# Patient Record
Sex: Female | Born: 1935 | Race: White | Hispanic: No | Marital: Single | Smoking: Never smoker
Health system: Southern US, Community
[De-identification: ages and names within clinical notes are randomized; demographics above are authoritative.]

## PROBLEM LIST (undated history)

## (undated) DIAGNOSIS — I10 Essential (primary) hypertension: Secondary | ICD-10-CM

## (undated) DIAGNOSIS — R509 Fever, unspecified: Secondary | ICD-10-CM

## (undated) DIAGNOSIS — K5790 Diverticulosis of intestine, part unspecified, without perforation or abscess without bleeding: Secondary | ICD-10-CM

## (undated) DIAGNOSIS — F419 Anxiety disorder, unspecified: Secondary | ICD-10-CM

## (undated) DIAGNOSIS — G5600 Carpal tunnel syndrome, unspecified upper limb: Secondary | ICD-10-CM

## (undated) DIAGNOSIS — K635 Polyp of colon: Secondary | ICD-10-CM

## (undated) DIAGNOSIS — K449 Diaphragmatic hernia without obstruction or gangrene: Secondary | ICD-10-CM

## (undated) DIAGNOSIS — G629 Polyneuropathy, unspecified: Secondary | ICD-10-CM

## (undated) DIAGNOSIS — H3530 Unspecified macular degeneration: Secondary | ICD-10-CM

## (undated) DIAGNOSIS — K219 Gastro-esophageal reflux disease without esophagitis: Secondary | ICD-10-CM

## (undated) DIAGNOSIS — M1990 Unspecified osteoarthritis, unspecified site: Secondary | ICD-10-CM

## (undated) HISTORY — PX: POLYPECTOMY: SHX149

## (undated) HISTORY — DX: Diverticulosis of intestine, part unspecified, without perforation or abscess without bleeding: K57.90

## (undated) HISTORY — DX: Carpal tunnel syndrome, unspecified upper limb: G56.00

## (undated) HISTORY — PX: LASIK: SHX215

## (undated) HISTORY — DX: Gastro-esophageal reflux disease without esophagitis: K21.9

## (undated) HISTORY — DX: Unspecified macular degeneration: H35.30

## (undated) HISTORY — PX: CATARACT EXTRACTION, BILATERAL: SHX1313

## (undated) HISTORY — PX: COLONOSCOPY: SHX174

## (undated) HISTORY — PX: VAGINAL HYSTERECTOMY: SUR661

## (undated) HISTORY — DX: Unspecified osteoarthritis, unspecified site: M19.90

## (undated) HISTORY — DX: Polyp of colon: K63.5

## (undated) HISTORY — DX: Anxiety disorder, unspecified: F41.9

## (undated) HISTORY — DX: Polyneuropathy, unspecified: G62.9

## (undated) HISTORY — PX: CHOLECYSTECTOMY: SHX55

---
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
## 1999-02-02 ENCOUNTER — Encounter (INDEPENDENT_AMBULATORY_CARE_PROVIDER_SITE_OTHER): Payer: Self-pay | Admitting: Specialist

## 1999-02-02 ENCOUNTER — Other Ambulatory Visit: Admission: RE | Admit: 1999-02-02 | Discharge: 1999-02-02 | Payer: Self-pay | Admitting: Gastroenterology

## 2001-04-16 ENCOUNTER — Encounter: Payer: Self-pay | Admitting: Emergency Medicine

## 2001-04-16 ENCOUNTER — Emergency Department (HOSPITAL_COMMUNITY): Admission: EM | Admit: 2001-04-16 | Discharge: 2001-04-16 | Payer: Self-pay | Admitting: Emergency Medicine

## 2001-04-18 ENCOUNTER — Encounter (INDEPENDENT_AMBULATORY_CARE_PROVIDER_SITE_OTHER): Payer: Self-pay | Admitting: Specialist

## 2001-04-18 ENCOUNTER — Inpatient Hospital Stay (HOSPITAL_COMMUNITY): Admission: EM | Admit: 2001-04-18 | Discharge: 2001-04-21 | Payer: Self-pay | Admitting: Internal Medicine

## 2001-06-04 ENCOUNTER — Encounter: Payer: Self-pay | Admitting: Internal Medicine

## 2001-06-04 ENCOUNTER — Ambulatory Visit (HOSPITAL_COMMUNITY): Admission: RE | Admit: 2001-06-04 | Discharge: 2001-06-04 | Payer: Self-pay | Admitting: Internal Medicine

## 2009-04-11 ENCOUNTER — Encounter (INDEPENDENT_AMBULATORY_CARE_PROVIDER_SITE_OTHER): Payer: Self-pay | Admitting: *Deleted

## 2009-04-12 ENCOUNTER — Ambulatory Visit: Payer: Self-pay | Admitting: Gastroenterology

## 2009-04-29 ENCOUNTER — Ambulatory Visit: Payer: Self-pay | Admitting: Gastroenterology

## 2009-05-03 ENCOUNTER — Encounter: Payer: Self-pay | Admitting: Gastroenterology

## 2010-03-02 NOTE — Letter (Signed)
Summary: Surgery [facility] Instructions  [facility]  [address] [place], [place] [address]   Phone: [phone]  Fax: [phone]       TYLYNN BRANIFF    74-Apr-1936    MRN: [id number]        Procedure Day Dorna Bloom:  Farrell Ours  04/29/09     Arrival Time:  1:00PM     Procedure Time:  2:00PM     Location of Procedure:                    _X _  [facility] (4th Floor)                        PREPARATION FOR COLONOSCOPY WITH MOVIPREP   Starting 5 days prior to your procedure 04/24/09 do not eat nuts, seeds, popcorn, corn, beans, peas,  salads, or any raw vegetables.  Do not take any fiber supplements (e.g. Metamucil, Citrucel, and Benefiber).  THE DAY BEFORE YOUR PROCEDURE         DATE: 04/28/09  DAY: THURSDAY  1.  Drink clear liquids the entire day-NO SOLID FOOD  2.  Do not drink anything colored red or purple.  Avoid juices with pulp.  No orange juice.  3.  Drink at least 64 oz. (8 glasses) of fluid/clear liquids during the day to prevent dehydration and help the prep work efficiently.  CLEAR LIQUIDS INCLUDE: Water Jello Ice Popsicles Tea (sugar ok, no milk/cream) Powdered fruit flavored drinks Coffee (sugar ok, no milk/cream) Gatorade Juice: apple, white grape, white cranberry  Lemonade Clear bullion, consomm, broth Carbonated beverages (any kind) Strained chicken noodle soup Hard Candy                             4.  In the morning, mix first dose of MoviPrep solution:    Empty 1 Pouch A and 1 Pouch B into the disposable container    Add lukewarm drinking water to the top line of the container. Mix to dissolve    Refrigerate (mixed solution should be used within 24 hrs)  5.  Begin drinking the prep at 5:00 p.m. The MoviPrep container is divided by 4 marks.   Every 15 minutes drink the solution down to the next mark (approximately 8 oz) until the full liter is complete.   6.  Follow completed prep with 16 oz of clear liquid of your choice (Nothing  red or purple).  Continue to drink clear liquids until bedtime.  7.  Before going to bed, mix second dose of MoviPrep solution:    Empty 1 Pouch A and 1 Pouch B into the disposable container    Add lukewarm drinking water to the top line of the container. Mix to dissolve    Refrigerate  THE DAY OF YOUR PROCEDURE      DATE: 04/29/09  DAY: FRIDAY  Beginning at 9:00AM (5 hours before procedure):         1. Every 15 minutes, drink the solution down to the next mark (approx 8 oz) until the full liter is complete.  2. Follow completed prep with 16 oz. of clear liquid of your choice.    3. You may drink clear liquids until 12:00PM (2 HOURS BEFORE PROCEDURE).   MEDICATION INSTRUCTIONS  Unless otherwise instructed, you should take regular prescription medications with a small sip of water   as early as possible the morning  of your procedure.  Additional medication instructions: Hold Benazepril/HCTZ the morning of procedure.  Take other prescriptions medicine the morning of procedure.         OTHER INSTRUCTIONS  You will need a responsible adult at least 74 years of age to accompany you and drive you home.   This person must remain in the waiting room during your procedure.  Wear loose fitting clothing that is easily removed.  Leave jewelry and other valuables at home.  However, you may wish to bring a book to read or  an iPod/MP3 player to listen to music as you wait for your procedure to start.  Remove all body piercing jewelry and leave at home.  Total time from sign-in until discharge is approximately 2-3 hours.  You should go home directly after your procedure and rest.  You can resume normal activities the  day after your procedure.  The day of your procedure you should not:   Drive   Make legal decisions   Operate machinery   Drink alcohol   Return to work  You will receive specific instructions about eating, activities and medications before you  leave.    The above instructions have been reviewed and explained to me by   Wyona Almas RN  April 12, 2009 1:22 PM     I fully understand and can verbalize these instructions _____________________________ Date _________

## 2010-03-02 NOTE — Letter (Signed)
Summary: Patient Notice- Polyp Results  [facility]  [address] [place], [place] [address]   Phone: [phone]  Fax: [phone]        May 03, 2009 MRN: [id number]    [facility] [address] [place], [place]  [address]    Dear Ms. Bevans,  I am pleased to inform you that the colon polyp(s) removed during your recent colonoscopy was (were) found to be benign (no cancer detected) upon pathologic examination.  I recommend you have a repeat colonoscopy examination in 5 years to look for recurrent polyps, as having colon polyps increases your risk for having recurrent polyps or even colon cancer in the future.  Should you develop new or worsening symptoms of abdominal pain, bowel habit changes or bleeding from the rectum or bowels, please schedule an evaluation with either your primary care physician or with me.  Continue treatment plan as outlined the day of your exam.  Please call us if you are having persistent problems or have questions about your condition that have not been fully answered at this time.  Sincerely,  Meryl Dare MD [facility]  This letter has been electronically signed by your physician.  Appended Document: Patient Notice- Polyp Results letter mailed 4.7.11

## 2010-03-02 NOTE — Miscellaneous (Signed)
Summary: [facility] Previsit/prep  Clinical Lists Changes  Medications: Added new medication of MOVIPREP 100 GM  SOLR (PEG-KCL-NACL-NASULF-NA ASC-C) As per prep instructions. - Signed Rx of MOVIPREP 100 GM  SOLR (PEG-KCL-NACL-NASULF-NA ASC-C) As per prep instructions.;  #1 x 0;  Signed;  Entered by: Wyona Almas RN;  Authorized by: Meryl Dare MD Clementeen Graham;  Method used: Electronically to [facility]*, [address], [place], [place]  [address], Ph: [phone] or [phone], Fax: [phone] Allergies: Added new allergy or adverse reaction of CODEINE Observations: Added new observation of NKA: F (04/12/2009 12:29)    Prescriptions: MOVIPREP 100 GM  SOLR (PEG-KCL-NACL-NASULF-NA ASC-C) As per prep instructions.  #1 x 0   Entered by:   Wyona Almas RN   Authorized by:   Meryl Dare MD [facility]   Signed by:   Wyona Almas RN on 04/12/2009   Method used:   Electronically to        [facility]* (retail)       [address] [place], [place]  [address]       Ph: [phone] or [phone]       Fax: [phone]   RxID:   [id number]

## 2010-03-02 NOTE — Procedures (Signed)
Summary: Colonoscopy  Patient: [name] Note: All result statuses are Final unless otherwise noted.  Tests: (1) Colonoscopy (COL)   COL Colonoscopy           DONE     [facility]     [address]     [place], [place]  [address]           COLONOSCOPY PROCEDURE REPORT           PATIENT:  Caitlin Chavez, Caitlin Chavez  MR#:  [id number]     BIRTHDATE:  21-Mar-1934, 74 yrs. old  GENDER:  female           ENDOSCOPIST:  Judie Petit T. Russella Dar, MD, [facility]     Referred by:  Kathee Delton, PA           PROCEDURE DATE:  04/29/2009     PROCEDURE:  Colonoscopy with biopsy and snare polypectomy     ASA CLASS:  Class II     INDICATIONS:  1) Routine Risk Screening           MEDICATIONS:   Fentanyl 50 mcg IV, Versed 7 mg IV           DESCRIPTION OF PROCEDURE:   After the risks benefits and     alternatives of the procedure were thoroughly explained, informed     consent was obtained.  Digital rectal exam was performed and     revealed no abnormalities.   The LB PCF-H180AL [id number] endoscope     was introduced through the anus and advanced to the cecum, which     was identified by both the appendix and ileocecal valve, without     limitations.  The quality of the prep was excellent, using     MoviPrep.  The instrument was then slowly withdrawn as the colon     was fully examined.     <<PROCEDUREIMAGES>>           FINDINGS:  Three polyps were found in the ascending colon. They     were 3 mm in size. The polyps were removed using cold biopsy     forceps.  A sessile polyp was found at the hepatic flexure. It was     6 mm in size. Polyp was snared without cautery. Retrieval was     successful. A sessile polyp was found in the mid transverse colon.     Polyp was snared without cautery. Retrieval was successful.  Mild     diverticulosis was found in the sigmoid colon.  This was otherwise     a normal examination of the colon.  Retroflexed views in the     rectum revealed no abnormalities.  The time to cecum  =  3     minutes. The scope was then withdrawn (time =  10.25  min) from the     patient and the procedure completed.           COMPLICATIONS:  None           ENDOSCOPIC IMPRESSION:     1) 3 mm Three polyps in the ascending colon     2) 6 mm sessile polyp at the hepatic flexure     3) Sessile polyp in the mid transverse colon     4) Mild diverticulosis in the sigmoid colon           RECOMMENDATIONS:     1) Await pathology results     2) High fiber diet with  liberal fluid intake.     3) If the polyps removed today are adenomatous (pre-cancerous),     you will need a repeat colonoscopy in 5 years. Otherwise you     should continue to follow colorectal cancer screening guidelines     for "routine risk" patients with colonoscopy in 10 years.     Venita Lick. Russella Dar, MD, Clementeen Graham           n.     eSIGNED:   Venita Lick. Stark at 04/29/2009 02:25 PM           Shye, Doty Beaver, [phone]  Note: An exclamation mark (!) indicates a result that was not dispersed into the flowsheet. Document Creation Date: 04/29/2009 2:26 PM _______________________________________________________________________  (1) Order result status: Final Collection or observation date-time: 04/29/2009 14:21 Requested date-time:  Receipt date-time:  Reported date-time:  Referring Physician:   Ordering Physician: Claudette Head [phone]) Specimen Source:  Source: Launa Grill Order Number: [id number] Lab site:   Appended Document: Colonoscopy     Procedures Next Due Date:    Colonoscopy: 04/2014

## 2010-06-16 NOTE — Op Note (Signed)
[facility]  Patient:    PADEN, KURAS Visit Number: [id number] MRN: [id number]          Service Type: MED Location: [phone] Attending Physician:  Dolores Patty Dictated by:   Zigmund Daniel, M.D. Proc. Date: 04/19/01 Admit Date:  04/18/2001   CC:         Titus Dubin. Alwyn Ren, M.D. [facility]   Operative Report  PREOPERATIVE DIAGNOSES:  Cholelithiasis and acute cholecystitis.  POSTOPERATIVE DIAGNOSES:  Cholelithiasis and acute cholecystitis.  OPERATION PERFORMED:  Laparoscopic cholecystectomy.  SURGEON:  Zigmund Daniel, M.D.  ASSISTANT:  Angelia Mould. Derrell Lolling, M.D.  ANESTHESIA:  General.  DESCRIPTION OF PROCEDURE:  After the patient was monitored and anesthetized and had routine preparation and draping of the abdomen, I anesthetized the area below the umbilicus, made a 3 cm transverse incision and incised the fascia longitudinally in the midline, opened the peritoneum bluntly, placed an #0 Vicryl pursestring suture and secured a Hasson cannula. After inflating the abdomen with CO2, I found that the gallbladder was acutely inflamed and distended. I then anesthetized three additional port sites and put in a 10 mm epigastric port and two 5 mm lateral ports and placed the patient head up, foot down and tilted to the left. I then bluntly separated adhesions of the omentum to the undersurface of the gallbladder and drained the clear bile from the area of the fundus of the gallbladder. It was then possible to grasp the fundus of the gallbladder and pull it up toward the right shoulder. I then dissected further to free up the gallbladder from the adhesions and identified the infundibulum, grasped it and pulled it laterally. In so doing, I accidentally tore a hole in it and what appeared to be pus came out. I sucked that away immediately. Subsequently after we removed the gallbladder, I cultured the lumen of it. With careful dissection, I  identified the cystic duct and the cystic artery. I clipped the cystic artery with three clips and cut between the two closest to the gallbladder. I put three clips on the cystic duct and cut above it. I then used the spatula cautery to detach the gallbladder from the liver and gained hemostasis in the gallbladder fossa. I then removed the gallbladder in a plastic pouch through the umbilical incision and replaced that port and copiously irrigated the operative area and removed the irrigant. Because of the inflammation which was present and the fact that some irrigant remained in the lateral gutter, I placed a 19 Blake drain brought out through the lateral port site and placed in the gallbladder fossa. I then checked again for hemostasis and found it to be good. I then removed the epigastric and lateral ports under direct vision and then camera and tied the pursestring suture in the umbilical incision. I closed the skin incisions with intracuticular 4-0 Vicryl and Steri-Strips. The patient was stable through the procedure. Dictated by:   Zigmund Daniel, M.D. Attending Physician:  Dolores Patty DD:  04/19/01 TD:  04/21/01 Job: [id number]

## 2010-06-16 NOTE — H&P (Signed)
[facility]  Patient:    Caitlin Chavez, Caitlin Chavez Visit Number: [id number] MRN: [id number]          Service Type: MED Location: [phone] Attending Physician:  Dolores Patty Dictated by:   Titus Dubin. Alwyn Ren, M.D. [facility] Admit Date:  04/18/2001   CC:         Zigmund Daniel, M.D.   History and Physical  HISTORY OF PRESENT ILLNESS:  Mykenzi Vanzile is a 76 year old white female admitted with right abdominal pain with clinical suspicion of a surgical abdomen.  On April 16, 2001, at approximately 4 a.m., she was seen in the emergency room at [facility] with epigastric knife-like pain.  Work-up was extensive and included an EKG, abdominal series, CPK and MB, lipase, urinalysis, and CBC, all of which were normal.  Her CMET was also normal, except for a glucose of 126.  She was given Prevacid and a suppository with some minor benefit.  The pain moved to the right lateral abdomen and has been persistent.  In fact, she has been unable to eat for the last 24 hours.  Now it is described as sharp and worse with deep breathing or with walking.  She denies any chest pain or shortness of breath.  She has had no melena or rectal bleeding.  PAST MEDICAL HISTORY:  Hysterectomy for pelvic pain.  She still has her ovaries.  She was evaluated in 1992 for chest pain.  Medical problems include hypertension, hypercholesterolemia, benign essential tremor, and esophageal reflux.  On occasion, she has had hypokalemia.  FAMILY HISTORY:  Congestive heart failure in her mother.  Gastritis in a sister.  Stomach cancer in her maternal grandmother.  SOCIAL HISTORY:  She has never smoked and does not drink.  She is not physically active.  ALLERGIES:  She is intolerant or allergic to CODEINE and SULFA.  MEDICATIONS: 1. Premarin 0.625 mg daily. 2. Centrum Silver daily. 3. Aspirin daily. 4. Inderal LA 80 mg one each morning for essential tremor. 5. Prevacid 30 mg  twice a day. 6. Lotensin 20/25 mg daily. 7. Calcium 1000 mg daily.  REVIEW OF SYSTEMS:  Some night sweats with possible fever.  The remainder of the review of systems was reviewed and is negative.  PHYSICAL EXAMINATION:  She appears uncomfortable.  WEIGHT:  170-1/2 pounds.  VITAL SIGNS:  Temperature 97.4 degrees, pulse 58, respiratory rate 22, and blood pressure 102/56.  HEENT:  She has arterial narrowing on fundal exam.  The otolaryngologic exam was unremarkable.  LUNGS:  She splints with respirations, but there is no rub noted.  HEART:  She has an S4.  EXTREMITIES:  All pulses are intact.  There are no bruits noted.  ABDOMEN:  Bowel sounds are decreased.  She is exquisitely tender over the right mid abdomen.  There is a mass effect in this area.  RECTAL:  The vault is empty.  Hemoccult testing is negative.  LYMPHATICS:  She has no lymphadenopathy in the head, neck, or axilla.  SKIN:  Somewhat pale, but dry and hot.  NEUROLOGIC:  She has no localizing neurologic signs.  The neuropsychiatric exam is negative.  IMPRESSION AND PLAN:  She is admitted for CT of the abdomen and pelvis.  A surgical consultation has been called.  The differential would include acute gallbladder disease.  The aggravation by deep breathing could be compatible with this.  Mass effect with rule out pneumonia is a likely etiology of her fever and abdominal pain.  She has not had an appendectomy and atypical presentation is also possible as is a colonic process despite the negative Hemoccults. Dictated by:   Titus Dubin. Alwyn Ren, M.D. [facility] Attending Physician:  Dolores Patty DD:  04/18/01 TD:  04/19/01 Job: [id number]

## 2010-06-16 NOTE — Discharge Summary (Signed)
[facility]  Patient:    Caitlin Chavez, Caitlin Chavez Visit Number: [id number] MRN: [id number]          Service Type: MED Location: [phone] Attending Physician:  Meredith Leeds Dictated by:   Zigmund Daniel, M.D. Admit Date:  04/18/2001 Discharge Date: 04/21/2001   CC:         Titus Dubin. Alwyn Ren, M.D. [facility]   Discharge Summary  HISTORY OF PRESENT ILLNESS:  The patient is a 76 year old white female admitted with the findings of upper abdominal pain and tenderness. Gallbladder ultrasound showed gallstones and a thick gallbladder wall. She was admitted by Dr. Alwyn Ren. Liver tests and a CBC were not remarkable. She had a fever. See the history and physical for greater detail. The patients general health is good. She is on Premarin, Centrum, and aspirin and Inderal LA for a tremor. She takes Prevacid for hyperacidity.  PHYSICAL EXAMINATION:  There was marked tenderness of the upper abdomen. Her physical examination was otherwise unremarkable.  HOSPITAL COURSE:  Dr. Alwyn Ren admitted the patient, provided IV antibiotics and pain medicine. I saw the patient that day and felt that she had acute cholecystitis. Ultrasound supported this diagnosis. On April 19, 2001, she underwent a laparoscopic cholecystectomy. The operation went well and postoperatively the patient did quite well. The next day she had a good deal of pain and nausea. She had a fever. I checked labs which were not remarkable and continued IV Cipro. The next day she felt very much better and went home. There had been a drain used postoperatively and I removed it on the day of discharge. It did not drain any bile. I gave her a prescription for Cipro 500 mg twice a day for five days. She is to see me at the office in 2-3 weeks.  DISCHARGE DIAGNOSES:  1. Cholelithiasis and acute cholecystitis.  2. Benign essential tremor.  OPERATION PERFORMED:  Laparoscopic cholecystectomy.  CONDITION ON  DISCHARGE:  Improved.Dictated by:   Zigmund Daniel, M.D.  Attending Physician:  Meredith Leeds DD:  04/25/01 TD:  04/26/01 Job: [id number]

## 2014-04-15 ENCOUNTER — Encounter: Payer: Self-pay | Admitting: Gastroenterology

## 2014-07-30 ENCOUNTER — Encounter: Payer: Self-pay | Admitting: Gastroenterology

## 2014-10-04 ENCOUNTER — Emergency Department (HOSPITAL_COMMUNITY)
Admission: EM | Admit: 2014-10-04 | Discharge: 2014-10-04 | Disposition: A | Discharge status: Home / Self Care | Payer: No Typology Code available for payment source | Attending: Emergency Medicine | Admitting: Emergency Medicine

## 2014-10-04 ENCOUNTER — Encounter (HOSPITAL_COMMUNITY): Payer: Self-pay | Admitting: Emergency Medicine

## 2014-10-04 DIAGNOSIS — Y9389 Activity, other specified: Secondary | ICD-10-CM | POA: Insufficient documentation

## 2014-10-04 DIAGNOSIS — S0990XA Unspecified injury of head, initial encounter: Secondary | ICD-10-CM | POA: Diagnosis present

## 2014-10-04 DIAGNOSIS — Z79899 Other long term (current) drug therapy: Secondary | ICD-10-CM | POA: Diagnosis not present

## 2014-10-04 DIAGNOSIS — R519 Headache, unspecified: Secondary | ICD-10-CM

## 2014-10-04 DIAGNOSIS — Z8719 Personal history of other diseases of the digestive system: Secondary | ICD-10-CM | POA: Insufficient documentation

## 2014-10-04 DIAGNOSIS — Y92410 Unspecified street and highway as the place of occurrence of the external cause: Secondary | ICD-10-CM | POA: Diagnosis not present

## 2014-10-04 DIAGNOSIS — I10 Essential (primary) hypertension: Secondary | ICD-10-CM | POA: Diagnosis not present

## 2014-10-04 DIAGNOSIS — Y998 Other external cause status: Secondary | ICD-10-CM | POA: Diagnosis not present

## 2014-10-04 DIAGNOSIS — R51 Headache: Secondary | ICD-10-CM

## 2014-10-04 HISTORY — DX: Essential (primary) hypertension: I10

## 2014-10-04 HISTORY — DX: Diaphragmatic hernia without obstruction or gangrene: K44.9

## 2014-10-04 MED ORDER — CYCLOBENZAPRINE HCL 10 MG PO TABS: 5.0000 mg | ORAL_TABLET | Freq: Three times a day (TID) | ORAL | Status: DC | PRN
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
## 2014-10-04 NOTE — ED Notes (Signed)
Patient was alert, oriented and stable upon discharge. RN went over AVS and patient had no further questions.  
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
## 2014-10-21 ENCOUNTER — Encounter: Payer: Self-pay | Admitting: Gastroenterology

## 2015-07-12 ENCOUNTER — Encounter: Payer: Self-pay | Admitting: Cardiology

## 2015-07-18 ENCOUNTER — Telehealth: Payer: Self-pay | Admitting: Cardiology

## 2015-07-18 NOTE — Telephone Encounter (Signed)
Received records from [facility] for appointment on 07/21/15 with Dr Percival Spanish.  Records given to [facility] (medical records) for Dr Hochrein's schedule on 07/21/15. lp

## 2015-07-20 NOTE — Progress Notes (Signed)
Cardiology Office Note   Date:  07/21/2015   ID:  Bay, Kaszynski 1934-09-18, MRN [id number]  PCP:  Tula Nakayama  Cardiologist:   Minus Breeding, MD   Chief Complaint  Patient presents with  . Shortness of Breath  . Chest Pain      History of Present Illness: Caitlin Chavez is a 81 y.o. female who presents for evaluation of shortness of breath and chest discomfort. She has no past cardiac history. She has been under stress because her husband died 39 months ago. They were married for over 49 years.  She says that for about the last 10 months she's been getting some mid chest discomfort. It seems to happen at rest or maybe with emotional stress. She doesn't necessarily notice it when she does her activities such as vacuuming. She does however get short of breath vacuuming and this has been last 3-4 months. She'll be short of breath walking a short distance in her yard. She's not describing resting shortness of breath, PND or orthopnea. She doesn't describe any palpitations, presyncope or syncope. She does mention that the discomfort is mild 2 out of 10. There is no radiation. It comes and goes spontaneously. Be a heaviness. She's not had before.   Past Medical History  Diagnosis Date  . Hypertension   . Hiatal hernia     Past Surgical History  Procedure Laterality Date  . Cholecystectomy    . Vaginal hysterectomy       Current Outpatient Prescriptions  Medication Sig Dispense Refill  . benazepril-hydrochlorthiazide (LOTENSIN HCT) 20-25 MG per tablet Take 1 tablet by mouth daily.  2  . carvedilol (COREG) 6.25 MG tablet Take 6.25 mg by mouth 2 (two) times daily.    Marland Kitchen gabapentin (NEURONTIN) 300 MG capsule Take 300 mg by mouth 2 (two) times daily.  3  . omeprazole (PRILOSEC) 20 MG capsule Take 20 mg by mouth daily.  3  . propranolol ER (INDERAL LA) 80 MG 24 hr capsule Take 80 mg by mouth daily.  5   No current facility-administered medications for this visit.     Allergies:   Codeine    Social History:  The patient  reports that she has never smoked. She does not have any smokeless tobacco history on file. She reports that she does not drink alcohol.   Family History:  The patient's family history is noted for her father having an MI at age 100.   ROS:  Please see the history of present illness.   Otherwise, review of systems are positive for neuropathy.   All other systems are reviewed and negative.    PHYSICAL EXAM: VS:  BP 126/74 mmHg  Pulse 80  Ht 5\' 5"  (1.651 m)  Wt 173 lb (78.472 kg)  BMI 28.79 kg/m2 , BMI Body mass index is 28.79 kg/(m^2). GENERAL:  Well appearing HEENT:  Pupils equal round and reactive, fundi not visualized, oral mucosa unremarkable NECK:  No jugular venous distention, waveform within normal limits, carotid upstroke brisk and symmetric, no bruits, no thyromegaly LYMPHATICS:  No cervical, inguinal adenopathy LUNGS:  Clear to auscultation bilaterally BACK:  No CVA tenderness CHEST:  Unremarkable HEART:  PMI not displaced or sustained,S1 and S2 within normal limits, no S3, no S4, no clicks, no rubs, no murmurs ABD:  Flat, positive bowel sounds normal in frequency in pitch, no bruits, no rebound, no guarding, no midline pulsatile mass, no hepatomegaly, no splenomegaly EXT:  2 plus pulses throughout,  trace leg edema, no cyanosis no clubbing SKIN:  No rashes no nodules NEURO:  Cranial nerves II through XII grossly intact, motor grossly intact throughout PSYCH:  Cognitively intact, oriented to person place and time    EKG:  EKG is ordered today. The ekg ordered today demonstrates sinus rhythm, rate 80, axis within normal limits, intervals within normal limits, poor anterior R-wave progression, low voltage in limb leads, nonspecific T-wave flattening.   Recent Labs: No results found for requested labs within last 365 days.    Lipid Panel No results found for: CHOL, TRIG, HDL, CHOLHDL, VLDL, LDLCALC, LDLDIRECT     Wt Readings from Last 3 Encounters:  07/21/15 173 lb (78.472 kg)      Other studies Reviewed: Additional studies/ records that were reviewed today include: Office records.. Review of the above records demonstrates:  Please see elsewhere in the note.     ASSESSMENT AND PLAN:  Chest pain:  The patient does describe some chest discomfort and shortness of breath. This could certainly be an anginal equivalent. Given his stress testing is indicated. However, she would not be a walk on a treadmill. Therefore, she will have a [employer].  Dyspnea:  This will be evaluated as above. Vision I will check a BNP level.  Hypertension:  The blood pressure is at target. No change in medications is indicated. We will continue with therapeutic lifestyle changes (TLC).   Current medicines are reviewed at length with the patient today.  The patient does not have concerns regarding medicines.  The following changes have been made:  no change  Labs/ tests ordered today include:   Orders Placed This Encounter  Procedures  . B Nat Peptide  . Myocardial Perfusion Imaging  . EKG 12-Lead     Disposition:   FU with me in six months or sooner if she has worse symptoms.      Signed, Minus Breeding, MD  07/21/2015 12:31 PM    [facility]

## 2015-07-21 ENCOUNTER — Ambulatory Visit (INDEPENDENT_AMBULATORY_CARE_PROVIDER_SITE_OTHER): Payer: Medicare Other | Admitting: Cardiology

## 2015-07-21 ENCOUNTER — Encounter: Payer: Self-pay | Admitting: Cardiology

## 2015-07-21 VITALS — BP 126/74 | HR 80 | Ht 65.0 in | Wt 173.0 lb

## 2015-07-21 DIAGNOSIS — R0789 Other chest pain: Secondary | ICD-10-CM | POA: Diagnosis not present

## 2015-07-21 DIAGNOSIS — R0602 Shortness of breath: Secondary | ICD-10-CM | POA: Diagnosis not present

## 2015-07-21 NOTE — Patient Instructions (Addendum)
Medication Instructions:  Continue current medications  Labwork: BNP today  Testing/Procedures: Your physician has requested that you have a lexiscan myoview. For further information please visit [email or web address]. Please follow instruction sheet, as given.  Follow-Up: Your physician wants you to follow-up in: 6 Months. You will receive a reminder letter in the mail two months in advance. If you don't receive a letter, please call our office to schedule the follow-up appointment.   Any Other Special Instructions Will Be Listed Below (If Applicable).  If you need a refill on your cardiac medications before your next appointment, please call your pharmacy.

## 2015-07-22 LAB — BRAIN NATRIURETIC PEPTIDE: Brain Natriuretic Peptide: 35.6 pg/mL (ref <100)

## 2015-08-04 ENCOUNTER — Telehealth: Payer: Self-pay | Admitting: *Deleted

## 2015-08-04 ENCOUNTER — Telehealth (HOSPITAL_COMMUNITY): Payer: Self-pay

## 2015-08-04 DIAGNOSIS — J9859 Other diseases of mediastinum, not elsewhere classified: Secondary | ICD-10-CM

## 2015-08-04 NOTE — Telephone Encounter (Signed)
-----   Message from Minus Breeding, MD sent at 07/26/2015 11:12 AM EDT ----- Given her dyspnea and lack of recent chest films to compare I would suggest a non contrasted chest CT to evaluate possible mediastinal mass.  Possibly this is bowel but needs to be followed up.  Call Ms. Mink with the results and send results to [facility], PA-C

## 2015-08-04 NOTE — Telephone Encounter (Signed)
Encounter complete. 

## 2015-08-04 NOTE — Telephone Encounter (Signed)
Ct chest w/o contrast was ordered message send to scheduler to be schedule.

## 2015-08-05 ENCOUNTER — Telehealth (HOSPITAL_COMMUNITY): Payer: Self-pay

## 2015-08-05 NOTE — Telephone Encounter (Signed)
Encounter complete. 

## 2015-08-08 ENCOUNTER — Telehealth: Payer: Self-pay | Admitting: Cardiology

## 2015-08-08 NOTE — Telephone Encounter (Signed)
Incoming call taken by Thomes Dinning, I was on the phone and unable to take the call.  Per Butch Penny, I do not need to call Crystal back.

## 2015-08-08 NOTE — Telephone Encounter (Signed)
Called back to [facility]. Crystal unavailable at the time. Left information that Dr Percival Spanish wanted a non-contrasted CT of the chest. Left message for her to call back if she has any questions.

## 2015-08-08 NOTE — Telephone Encounter (Signed)
New message      Returning a call to the nurse regarding the CT scan order.  Please call

## 2015-08-09 ENCOUNTER — Encounter (HOSPITAL_COMMUNITY): Payer: Medicare Other

## 2015-08-10 ENCOUNTER — Ambulatory Visit (HOSPITAL_COMMUNITY)
Admission: RE | Admit: 2015-08-10 | Discharge: 2015-08-10 | Disposition: A | Discharge status: Home / Self Care | Payer: Medicare Other | Source: Ambulatory Visit | Attending: Cardiology | Admitting: Cardiology

## 2015-08-10 DIAGNOSIS — E669 Obesity, unspecified: Secondary | ICD-10-CM | POA: Insufficient documentation

## 2015-08-10 DIAGNOSIS — Z6828 Body mass index (BMI) 28.0-28.9, adult: Secondary | ICD-10-CM | POA: Insufficient documentation

## 2015-08-10 DIAGNOSIS — Z8249 Family history of ischemic heart disease and other diseases of the circulatory system: Secondary | ICD-10-CM | POA: Insufficient documentation

## 2015-08-10 DIAGNOSIS — R5383 Other fatigue: Secondary | ICD-10-CM | POA: Diagnosis not present

## 2015-08-10 DIAGNOSIS — R0789 Other chest pain: Secondary | ICD-10-CM | POA: Insufficient documentation

## 2015-08-10 DIAGNOSIS — R079 Chest pain, unspecified: Secondary | ICD-10-CM | POA: Insufficient documentation

## 2015-08-10 DIAGNOSIS — R0609 Other forms of dyspnea: Secondary | ICD-10-CM | POA: Insufficient documentation

## 2015-08-10 DIAGNOSIS — R0602 Shortness of breath: Secondary | ICD-10-CM | POA: Insufficient documentation

## 2015-08-10 DIAGNOSIS — I10 Essential (primary) hypertension: Secondary | ICD-10-CM | POA: Diagnosis not present

## 2015-08-10 LAB — MYOCARDIAL PERFUSION IMAGING
CHL CUP NUCLEAR SDS: 0
CHL CUP RESTING HR STRESS: 67 {beats}/min
LV sys vol: 15 mL
LVDIAVOL: 54 mL (ref 46–106)
Peak HR: 83 {beats}/min
SRS: 0
SSS: 0
TID: 1.27

## 2015-08-10 MED ORDER — REGADENOSON 0.4 MG/5ML IV SOLN
0.4000 mg | Freq: Once | INTRAVENOUS | Status: AC
  Administered 2015-08-10: 0.4 mg via INTRAVENOUS

## 2015-08-10 MED ORDER — TECHNETIUM TC 99M TETROFOSMIN IV KIT
31.7000 | PACK | Freq: Once | INTRAVENOUS | Status: AC | PRN
  Administered 2015-08-10: 31.7 via INTRAVENOUS
  Filled 2015-08-10: qty 32

## 2015-08-10 MED ORDER — AMINOPHYLLINE 25 MG/ML IV SOLN
75.0000 mg | Freq: Once | INTRAVENOUS | Status: AC
  Administered 2015-08-10: 75 mg via INTRAVENOUS

## 2015-08-10 MED ORDER — TECHNETIUM TC 99M TETROFOSMIN IV KIT
10.4000 | PACK | Freq: Once | INTRAVENOUS | Status: AC | PRN
  Administered 2015-08-10: 10.4 via INTRAVENOUS
  Filled 2015-08-10: qty 10

## 2015-08-12 ENCOUNTER — Ambulatory Visit (INDEPENDENT_AMBULATORY_CARE_PROVIDER_SITE_OTHER)
Admission: RE | Admit: 2015-08-12 | Discharge: 2015-08-12 | Disposition: A | Discharge status: Home / Self Care | Payer: Medicare Other | Source: Ambulatory Visit | Attending: Cardiology | Admitting: Cardiology

## 2015-08-12 DIAGNOSIS — J9859 Other diseases of mediastinum, not elsewhere classified: Secondary | ICD-10-CM

## 2015-11-10 ENCOUNTER — Encounter: Payer: Self-pay | Admitting: Nurse Practitioner

## 2015-11-16 ENCOUNTER — Other Ambulatory Visit: Payer: Self-pay | Admitting: Family Medicine

## 2015-11-16 DIAGNOSIS — R1030 Lower abdominal pain, unspecified: Secondary | ICD-10-CM

## 2015-11-21 ENCOUNTER — Ambulatory Visit
Admission: RE | Admit: 2015-11-21 | Discharge: 2015-11-21 | Disposition: A | Discharge status: Home / Self Care | Payer: Medicare Other | Source: Ambulatory Visit | Attending: Family Medicine | Admitting: Family Medicine

## 2015-11-21 DIAGNOSIS — R1030 Lower abdominal pain, unspecified: Secondary | ICD-10-CM

## 2015-11-21 MED ORDER — IOPAMIDOL (ISOVUE-300) INJECTION 61%
100.0000 mL | Freq: Once | INTRAVENOUS | Status: AC | PRN
  Administered 2015-11-21: 100 mL via INTRAVENOUS

## 2015-11-23 ENCOUNTER — Encounter: Payer: Self-pay | Admitting: Nurse Practitioner

## 2015-11-23 ENCOUNTER — Ambulatory Visit (INDEPENDENT_AMBULATORY_CARE_PROVIDER_SITE_OTHER): Payer: Medicare Other | Admitting: Nurse Practitioner

## 2015-11-23 VITALS — BP 112/66 | HR 76 | Ht 62.4 in | Wt 176.1 lb

## 2015-11-23 DIAGNOSIS — Z86010 Personal history of colonic polyps: Secondary | ICD-10-CM | POA: Diagnosis not present

## 2015-11-23 DIAGNOSIS — R1032 Left lower quadrant pain: Secondary | ICD-10-CM

## 2015-11-23 MED ORDER — NA SULFATE-K SULFATE-MG SULF 17.5-3.13-1.6 GM/177ML PO SOLN: 1.0000 | Freq: Once | ORAL | 0 refills | Status: AC

## 2015-11-23 NOTE — Patient Instructions (Addendum)
If you are age 79 or older, your body mass index should be between 23-30. Your Body mass index is 31.8 kg/m. If this is out of the aforementioned range listed, please consider follow up with your Primary Care Provider.  If you are age 36 or younger, your body mass index should be between 19-25. Your Body mass index is 31.8 kg/m. If this is out of the aformentioned range listed, please consider follow up with your Primary Care Provider.   You have been scheduled for a colonoscopy. Please follow written instructions given to you at your visit today.  Please pick up your prep supplies at the pharmacy within the next 1-3 days. If you use inhalers (even only as needed), please bring them with you on the day of your procedure. Your physician has requested that you go to www.startemmi.com and enter the access code given to you at your visit today. This web site gives a general overview about your procedure. However, you should still follow specific instructions given to you by our office regarding your preparation for the procedure.  Thank you for choosing [facility]  Dr Wilfrid Lund III

## 2015-11-23 NOTE — Progress Notes (Signed)
HPI: Patient is a 81 year old female known to Dr. Fuller Plan for history of adenomatous colon polyps in 2011. She is due for surveillance colonoscopy. No bowel changes or rectal bleeding. Recent hgb 12.   Patient is referred by PCP, Isabella Stalling, P.A-C for evaluation of lower abdominal pain. Pain is predominantly left-sided and located in her left groin. It is a constant achy type pain,  hurts to lay on left side. Pain unrelated to bowel movements.  She has some low back pain at times. She saw PCP for this pain 11/10/15, found to have a UTI, given Cipro . Serum chemistries unremarkable. CBC showed normal WBC. Hemoglobin 12.  CT scan 2 days ago for the pain was unremarkable. No vaginal symptoms, patient is status post hysterectomy.  Past Medical History:  Diagnosis Date  . Anxiety   . Colon polyps   . Diverticulosis   . Hiatal hernia   . Hypertension     Past Surgical History:  Procedure Laterality Date  . CHOLECYSTECTOMY    . VAGINAL HYSTERECTOMY     Family History  Problem Relation Age of Onset  . Heart attack Father 66  . Alcohol abuse Father   . Heart failure Mother 28    chf  . Stomach cancer Maternal Grandmother   . Colon cancer Neg Hx    Social History  Substance Use Topics  . Smoking status: Never Smoker  . Smokeless tobacco: Never Used  . Alcohol use No   Current Outpatient Prescriptions  Medication Sig Dispense Refill  . benazepril-hydrochlorthiazide (LOTENSIN HCT) 20-25 MG per tablet Take 1 tablet by mouth daily.  2  . carvedilol (COREG) 6.25 MG tablet Take 6.25 mg by mouth 2 (two) times daily.    Marland Kitchen gabapentin (NEURONTIN) 300 MG capsule Take 300 mg by mouth 2 (two) times daily.  3  . omeprazole (PRILOSEC) 20 MG capsule Take 20 mg by mouth daily.  3  . Na Sulfate-K Sulfate-Mg Sulf 17.5-3.13-1.6 GM/180ML SOLN Take 1 kit by mouth once. 354 mL 0   No current facility-administered medications for this visit.    Allergies  Allergen Reactions  . Codeine       REACTION: hives/nausea    Review of Systems: All systems reviewed and negative except where noted in HPI.   Ct Abdomen Pelvis W Contrast  Result Date: 11/21/2015 CLINICAL DATA:  Bilateral lower abdominal pain in LEFT groin for 1 month EXAM: CT ABDOMEN AND PELVIS WITH CONTRAST TECHNIQUE: Multidetector CT imaging of the abdomen and pelvis was performed using the standard protocol following bolus administration of intravenous contrast. CONTRAST:  170m ISOVUE-300 IOPAMIDOL (ISOVUE-300) INJECTION 61% COMPARISON:  CT chest 08/12/2015 FINDINGS: Lower chest: Lung bases are clear. Hepatobiliary: No focal hepatic lesion. Postcholecystectomy. No biliary dilatation. Pancreas: Pancreas is normal. No ductal dilatation. No pancreatic inflammation. Spleen: Normal spleen Adrenals/urinary tract: Adrenal glands and kidneys are normal. The ureters and bladder normal. Stomach/Bowel: Large hiatal hernia with half the stomach above the hemidiaphragm. Stomach, small bowel, appendix, and cecum are normal. Several diverticula descending colon and sigmoid colon without acute inflammation. Vascular/Lymphatic: Minimal atherosclerotic disease. Normal caliber aorta. No retroperitoneal periportal lymphadenopathy. Reproductive: Post hysterectomy.  No adnexal abnormality. Other: No inguinal hernia.  Tiny umbilical hernia contains fat only Musculoskeletal: No aggressive osseous lesion. Degenerative osteophytosis of the spine. IMPRESSION: 1. No explanation for lower quadrant pain. 2. No hernia.  No obstructive uropathy. 3. Large hiatal 4. Sigmoid Colon diverticulosis without evidence diverticulitis. Electronically Signed   By:  Suzy Bouchard M.D.   On: 11/21/2015 16:26    Physical Exam: BP 112/66   Pulse 76   Ht 5' 2.4" (1.585 m)   Wt 176 lb 2 oz (79.9 kg)   BMI 31.80 kg/m  Constitutional: Pleasant,well-developed, white female in no acute distress. HEENT: Normocephalic and atraumatic. Conjunctivae are normal. No scleral  icterus. Neck supple.  Cardiovascular: Normal rate, regular rhythm.  Pulmonary/chest: Effort normal and breath sounds normal. No wheezing, rales or rhonchi. Abdominal: Soft, nondistended, nontender. Bowel sounds active throughout. There are no masses palpable. No hepatomegaly. Localized area of tenderness in left groin.  Extremities: no edema Lymphadenopathy: No cervical adenopathy noted. Neurological: Alert and oriented to person place and time. Skin: Skin is warm and dry. No rashes noted. Psychiatric: Normal mood and affect. Behavior is normal.   ASSESSMENT AND PLAN:  38, 81 year old female with constant left groin pain. Based on history and exam her pain seems to be musculoskeletal in nature. CTscan two days ago was unrevealing. Recent labs at PCP's office were unremarkable.   2. History of adenomatous colon polyps April 2011. She is due for surveillance colonoscopy.Patient will be scheduled for a colonoscopy with possible polypectomy. The risks and benefits of the procedure were discussed and the patient agrees to proceed.   3. Large hiatal hernia, half the stomach above hemidiaphragm on CTscan. Asymptomatic.    Cc: Aletha Halim., PA-C

## 2015-11-25 NOTE — Progress Notes (Signed)
Reviewed and agree with initial management plan.  Malcolm T. Stark, MD FACG 

## 2015-11-30 ENCOUNTER — Encounter: Payer: Self-pay | Admitting: Gastroenterology

## 2015-12-14 ENCOUNTER — Encounter: Payer: Self-pay | Admitting: Gastroenterology

## 2015-12-14 ENCOUNTER — Ambulatory Visit (AMBULATORY_SURGERY_CENTER): Payer: Medicare Other | Admitting: Gastroenterology

## 2015-12-14 VITALS — BP 147/72 | HR 64 | Temp 98.6°F | Resp 11 | Ht 62.0 in | Wt 176.0 lb

## 2015-12-14 DIAGNOSIS — D123 Benign neoplasm of transverse colon: Secondary | ICD-10-CM

## 2015-12-14 DIAGNOSIS — Z86010 Personal history of colonic polyps: Secondary | ICD-10-CM | POA: Diagnosis present

## 2015-12-14 MED ORDER — SODIUM CHLORIDE 0.9 % IV SOLN: 500.0000 mL | INTRAVENOUS | Status: DC

## 2015-12-14 NOTE — Op Note (Signed)
[place] Patient Name: Caitlin Chavez Procedure Date: 12/14/2015 4:01 PM MRN: [id number] Endoscopist: Ladene Artist , MD Age: 81 Referring MD:  Date of Birth: Jul 12, 1934 Gender: Female Account #: [id number] Procedure:                Colonoscopy Indications:              Surveillance: Personal history of adenomatous                            polyps on last colonoscopy > 5 years ago Medicines:                Monitored Anesthesia Care Procedure:                Pre-Anesthesia Assessment:                           - Prior to the procedure, a History and Physical                            was performed, and patient medications and                            allergies were reviewed. The patient's tolerance of                            previous anesthesia was also reviewed. The risks                            and benefits of the procedure and the sedation                            options and risks were discussed with the patient.                            All questions were answered, and informed consent                            was obtained. Prior Anticoagulants: The patient has                            taken no previous anticoagulant or antiplatelet                            agents. ASA Grade Assessment: II - A patient with                            mild systemic disease. After reviewing the risks                            and benefits, the patient was deemed in                            satisfactory condition to undergo the procedure.  After obtaining informed consent, the colonoscope                            was passed under direct vision. Throughout the                            procedure, the patient's blood pressure, pulse, and                            oxygen saturations were monitored continuously. The                            Model PCF-H190L [id number]) scope was introduced                            through the anus and  advanced to the the cecum,                            identified by appendiceal orifice and ileocecal                            valve. The ileocecal valve, appendiceal orifice,                            and rectum were photographed. The quality of the                            bowel preparation was good. The colonoscopy was                            performed without difficulty. The patient tolerated                            the procedure well. Scope In: 4:06:05 PM Scope Out: 4:19:56 PM Scope Withdrawal Time: 0 hours 10 minutes 54 seconds  Total Procedure Duration: 0 hours 13 minutes 51 seconds  Findings:                 The perianal and digital rectal examinations were                            normal.                           A 7 mm polyp was found in the transverse colon. The                            polyp was sessile. The polyp was removed with a                            cold snare. Resection and retrieval were complete.                           Internal hemorrhoids were found during  retroflexion. The hemorrhoids were small and Grade                            I (internal hemorrhoids that do not prolapse).                           A few small-mouthed diverticula were found in the                            sigmoid colon.                           A 5 mm polyp was found in the transverse colon. The                            polyp was sessile. The polyp was removed with a                            cold biopsy forceps. Resection and retrieval were                            complete.                           The exam was otherwise without abnormality on                            direct and retroflexion views. Complications:            No immediate complications. Estimated blood loss:                            None. Estimated Blood Loss:     Estimated blood loss: none. Impression:               - One 7 mm polyp in the transverse colon,  removed                            with a cold snare. Resected and retrieved.                           - Internal hemorrhoids.                           - Diverticulosis in the sigmoid colon.                           - One 5 mm polyp in the transverse colon, removed                            with a cold biopsy forceps. Resected and retrieved.                           - The examination was otherwise normal on direct  and retroflexion views. Recommendation:           - Patient has a contact number available for                            emergencies. The signs and symptoms of potential                            delayed complications were discussed with the                            patient. Return to normal activities tomorrow.                            Written discharge instructions were provided to the                            patient.                           - Highh fiber diet.                           - Continue present medications.                           - Await pathology results.                           - No repeat colonoscopy due to age. Ladene Artist, MD 12/14/2015 4:23:39 PM This report has been signed electronically.

## 2015-12-14 NOTE — Progress Notes (Signed)
To recovery, report to Scott, RN, VSS 

## 2015-12-14 NOTE — Patient Instructions (Signed)
YOU HAD AN ENDOSCOPIC PROCEDURE TODAY AT [facility]:   Refer to the procedure report that was given to you for any specific questions about what was found during the examination.  If the procedure report does not answer your questions, please call your gastroenterologist to clarify.  If you requested that your care partner not be given the details of your procedure findings, then the procedure report has been included in a sealed envelope for you to review at your convenience later.  YOU SHOULD EXPECT: Some feelings of bloating in the abdomen. Passage of more gas than usual.  Walking can help get rid of the air that was put into your GI tract during the procedure and reduce the bloating. If you had a lower endoscopy (such as a colonoscopy or flexible sigmoidoscopy) you may notice spotting of blood in your stool or on the toilet paper. If you underwent a bowel prep for your procedure, you may not have a normal bowel movement for a few days.  Please Note:  You might notice some irritation and congestion in your nose or some drainage.  This is from the oxygen used during your procedure.  There is no need for concern and it should clear up in a day or so.  SYMPTOMS TO REPORT IMMEDIATELY:   Following lower endoscopy (colonoscopy or flexible sigmoidoscopy):  Excessive amounts of blood in the stool  Significant tenderness or worsening of abdominal pains  Swelling of the abdomen that is new, acute  Fever of 100F or higher   Following upper endoscopy (EGD)  Vomiting of blood or coffee ground material  New chest pain or pain under the shoulder blades  Painful or persistently difficult swallowing  New shortness of breath  Fever of 100F or higher  Black, tarry-looking stools  For urgent or emergent issues, a gastroenterologist can be reached at any hour by calling [phone].   DIET:  We do recommend a small meal at first, but then you may proceed to your regular diet.  Drink  plenty of fluids but you should avoid alcoholic beverages for 24 hours.  ACTIVITY:  You should plan to take it easy for the rest of today and you should NOT DRIVE or use heavy machinery until tomorrow (because of the sedation medicines used during the test).    FOLLOW UP: Our staff will call the number listed on your records the next business day following your procedure to check on you and address any questions or concerns that you may have regarding the information given to you following your procedure. If we do not reach you, we will leave a message.  However, if you are feeling well and you are not experiencing any problems, there is no need to return our call.  We will assume that you have returned to your regular daily activities without incident.  If any biopsies were taken you will be contacted by phone or by letter within the next 1-3 weeks.  Please call us at [phone] if you have not heard about the biopsies in 3 weeks.    SIGNATURES/CONFIDENTIALITY: You and/or your care partner have signed paperwork which will be entered into your electronic medical record.  These signatures attest to the fact that that the information above on your After Visit Summary has been reviewed and is understood.  Full responsibility of the confidentiality of this discharge information lies with you and/or your care-partner. Polyp, diverticulosis, high fiber diet information given. Hemorrhoid information given.

## 2015-12-14 NOTE — Progress Notes (Signed)
Called to room to assist during endoscopic procedure.  Patient ID and intended procedure confirmed with present staff. Received instructions for my participation in the procedure from the performing physician.  

## 2015-12-15 ENCOUNTER — Telehealth: Payer: Self-pay

## 2015-12-15 NOTE — Telephone Encounter (Signed)
  Follow up Call-  Call back number 12/14/2015  Post procedure Call Back phone  # [phone]  Permission to leave phone message Yes  Some recent data might be hidden     Patient questions:  Do you have a fever, pain , or abdominal swelling? No. Pain Score  0 *  Have you tolerated food without any problems? Yes.    Have you been able to return to your normal activities? Yes.    Do you have any questions about your discharge instructions: Diet   No. Medications  No. Follow up visit  No.  Do you have questions or concerns about your Care? No.  Actions: * If pain score is 4 or above: No action needed, pain <4.   No problems per the pt. maw

## 2015-12-26 ENCOUNTER — Encounter: Payer: Self-pay | Admitting: Gastroenterology

## 2016-02-22 DIAGNOSIS — R05 Cough: Secondary | ICD-10-CM | POA: Diagnosis not present

## 2016-02-22 DIAGNOSIS — J00 Acute nasopharyngitis [common cold]: Secondary | ICD-10-CM | POA: Diagnosis not present

## 2016-02-22 DIAGNOSIS — J069 Acute upper respiratory infection, unspecified: Secondary | ICD-10-CM | POA: Diagnosis not present

## 2016-02-28 DIAGNOSIS — H43813 Vitreous degeneration, bilateral: Secondary | ICD-10-CM | POA: Diagnosis not present

## 2016-02-28 DIAGNOSIS — H35423 Microcystoid degeneration of retina, bilateral: Secondary | ICD-10-CM | POA: Diagnosis not present

## 2016-02-28 DIAGNOSIS — H353211 Exudative age-related macular degeneration, right eye, with active choroidal neovascularization: Secondary | ICD-10-CM | POA: Diagnosis not present

## 2016-02-28 DIAGNOSIS — H353122 Nonexudative age-related macular degeneration, left eye, intermediate dry stage: Secondary | ICD-10-CM | POA: Diagnosis not present

## 2016-04-24 DIAGNOSIS — H353122 Nonexudative age-related macular degeneration, left eye, intermediate dry stage: Secondary | ICD-10-CM | POA: Diagnosis not present

## 2016-04-24 DIAGNOSIS — H353211 Exudative age-related macular degeneration, right eye, with active choroidal neovascularization: Secondary | ICD-10-CM | POA: Diagnosis not present

## 2016-04-24 DIAGNOSIS — H35423 Microcystoid degeneration of retina, bilateral: Secondary | ICD-10-CM | POA: Diagnosis not present

## 2016-04-24 DIAGNOSIS — H43813 Vitreous degeneration, bilateral: Secondary | ICD-10-CM | POA: Diagnosis not present

## 2016-05-15 DIAGNOSIS — G250 Essential tremor: Secondary | ICD-10-CM | POA: Diagnosis not present

## 2016-08-11 ENCOUNTER — Encounter (HOSPITAL_COMMUNITY): Payer: Self-pay | Admitting: Emergency Medicine

## 2016-08-11 ENCOUNTER — Emergency Department (HOSPITAL_COMMUNITY)
Admission: EM | Admit: 2016-08-11 | Discharge: 2016-08-12 | Disposition: A | Discharge status: Home / Self Care | Payer: Medicare Other | Attending: Emergency Medicine | Admitting: Emergency Medicine

## 2016-08-11 DIAGNOSIS — Z79899 Other long term (current) drug therapy: Secondary | ICD-10-CM | POA: Insufficient documentation

## 2016-08-11 DIAGNOSIS — I10 Essential (primary) hypertension: Secondary | ICD-10-CM | POA: Insufficient documentation

## 2016-08-11 DIAGNOSIS — L509 Urticaria, unspecified: Secondary | ICD-10-CM | POA: Diagnosis present

## 2016-08-11 DIAGNOSIS — T782XXA Anaphylactic shock, unspecified, initial encounter: Secondary | ICD-10-CM | POA: Insufficient documentation

## 2016-08-11 MED ORDER — FAMOTIDINE IN NACL 20-0.9 MG/50ML-% IV SOLN
20.0000 mg | Freq: Once | INTRAVENOUS | Status: AC
  Administered 2016-08-12: 20 mg via INTRAVENOUS
  Filled 2016-08-11: qty 50

## 2016-08-11 NOTE — ED Triage Notes (Signed)
Pt arrives via EMS for allergic reaction. Pt started itching and then developed hives and SHOB. Pt had hives on arms, legs, and torso. EMS administerd 0.3 Epi, 125 mg solumedrol, 10mg  albuterol, 0.5 atrovent, 4 mg zofran ODT. Pt took 50mg  benadryl by mouth prior to EMS arrival

## 2016-08-11 NOTE — ED Provider Notes (Signed)
[facility] DEPT Provider Note   CSN: [id number] Arrival date & time: 08/11/16  2312 By signing my name below, I, Dyke Brackett, attest that this documentation has been prepared under the direction and in the presence of Jola Schmidt, MD . Electronically Signed: Dyke Brackett, Scribe. 08/11/2016. 11:27 PM.   History   Chief Complaint Chief Complaint  Patient presents with  . Allergic Reaction   HPI Caitlin Chavez is a 82 y.o. female who presents to the Emergency Department complaining of sudden onset, progressively improving hives to bilateral arms, legs, and torso onset tonight shortly PTA.  She reports associated lightheadedness and shortness of breath. EMS administered 50 mg benadryl, 0.3 mg Epi, 125 mg solumedrol, 10 mg albuterol and 0.5 Atrovent with significant relief of symptoms. Pt states she currently has a cold.Per pt, she has never experienced an allergic reaction like this and is unsure what may have caused her symptoms. Pt has no other acute complaints or associated symptoms at this time.    The history is provided by the patient. No language interpreter was used.    Past Medical History:  Diagnosis Date  . Anxiety   . Arthritis    hands  . Colon polyps   . Diverticulosis   . GERD (gastroesophageal reflux disease)   . Hiatal hernia   . Hypertension   . Macular degeneration     There are no active problems to display for this patient.  Past Surgical History:  Procedure Laterality Date  . CHOLECYSTECTOMY    . COLONOSCOPY    . POLYPECTOMY    . VAGINAL HYSTERECTOMY      OB History    No data available       Home Medications    Prior to Admission medications   Medication Sig Start Date End Date Taking? Authorizing Provider  benazepril-hydrochlorthiazide (LOTENSIN HCT) 20-25 MG per tablet Take 1 tablet by mouth daily. 08/04/14   [provider]  carvedilol (COREG) 6.25 MG tablet Take 6.25 mg by mouth 2 (two) times daily. 07/11/15   [provider]  gabapentin (NEURONTIN) 300 MG capsule Take 300 mg by mouth 2 (two) times daily. 07/11/15   [provider]  Multiple Vitamins-Minerals (OCUVITE PO) Take by mouth.    [provider]  omeprazole (PRILOSEC) 20 MG capsule Take 20 mg by mouth daily. 08/21/14   [provider]  valACYclovir (VALTREX) 1000 MG tablet  12/12/15   [provider]    Family History Family History  Problem Relation Age of Onset  . Heart attack Father 63  . Alcohol abuse Father   . Heart failure Mother 74       chf  . Heart disease Mother   . Stomach cancer Maternal Grandmother   . Colon cancer Neg Hx     Social History Social History  Substance Use Topics  . Smoking status: Never Smoker  . Smokeless tobacco: Never Used  . Alcohol use No     Allergies   Codeine   Review of Systems Review of Systems All systems reviewed and are negative for acute change except as noted in the HPI.  Physical Exam Updated Vital Signs BP (!) 155/84   Pulse 62   Temp 97.7 F (36.5 C) (Oral)   Ht 5\' 4"  (1.626 m)   Wt 175 lb (79.4 kg)   SpO2 94%   BMI 30.04 kg/m   Physical Exam  Constitutional: She is oriented to person, place, and time. She appears well-developed  and well-nourished. No distress.  HENT:  Head: Normocephalic and atraumatic.  Eyes: EOM are normal.  Neck: Normal range of motion.  Cardiovascular: Normal rate, regular rhythm and normal heart sounds.   Pulmonary/Chest: Effort normal and breath sounds normal. No stridor. She has no wheezes.  Abdominal: Soft. She exhibits no distension. There is no tenderness.  Musculoskeletal: Normal range of motion.  Neurological: She is alert and oriented to person, place, and time.  Skin: Skin is warm and dry.  Diffuse hives   Psychiatric: She has a normal mood and affect. Judgment normal.  Nursing note and vitals reviewed.  ED Treatments / Results  DIAGNOSTIC STUDIES:  Oxygen Saturation is 94% on RA,  adequate by my interpretation.    COORDINATION OF CARE:  11:28 PM Discussed treatment plan with pt at bedside and pt agreed to plan.   Labs (all labs ordered are listed, but only abnormal results are displayed) Labs Reviewed - No data to display  EKG  EKG Interpretation None       Radiology No results found.  Procedures Procedures (including critical care time)  Medications Ordered in ED Medications - No data to display   Initial Impression / Assessment and Plan / ED Course  I have reviewed the triage vital signs and the nursing notes.  Pertinent labs & imaging results that were available during my care of the patient were reviewed by me and considered in my medical decision making (see chart for details).    Patient is much improved than when EMS had her.  It sounds as though she had anaphylaxis with lightheadedness and difficulty breathing as well as diffuse urticaria.  She is doing much better.  We'll continue to observe her   2:16 AM Continues to do well.  Feels much better this time.  No rebound anaphylaxis.  Discharge home with standard medications including EpiPen  Final Clinical Impressions(s) / ED Diagnoses   Final diagnoses:  Anaphylaxis, initial encounter    New Prescriptions New Prescriptions   DIPHENHYDRAMINE (BENADRYL) 25 MG TABLET    Take 1 tablet (25 mg total) by mouth every 6 (six) hours.   EPINEPHRINE 0.3 MG/0.3 ML IJ SOAJ INJECTION    Inject 0.3 mLs (0.3 mg total) into the muscle once.   FAMOTIDINE (PEPCID) 20 MG TABLET    Take 1 tablet (20 mg total) by mouth 2 (two) times daily.   PREDNISONE (DELTASONE) 20 MG TABLET    Take 2 tablets (40 mg total) by mouth daily.   I personally performed the services described in this documentation, which was scribed in my presence. The recorded information has been reviewed and is accurate.        Jola Schmidt, MD 08/12/16 [phone]

## 2016-08-12 MED ORDER — DIPHENHYDRAMINE HCL 25 MG PO TABS: 25.0000 mg | ORAL_TABLET | Freq: Four times a day (QID) | ORAL | 0 refills | Status: DC

## 2016-08-12 MED ORDER — FAMOTIDINE 20 MG PO TABS: 20.0000 mg | ORAL_TABLET | Freq: Two times a day (BID) | ORAL | 0 refills | Status: DC

## 2016-08-12 MED ORDER — EPINEPHRINE 0.3 MG/0.3ML IJ SOAJ: 0.3000 mg | Freq: Once | INTRAMUSCULAR | 0 refills | Status: AC

## 2016-08-12 MED ORDER — PREDNISONE 20 MG PO TABS: 40.0000 mg | ORAL_TABLET | Freq: Every day | ORAL | 0 refills | Status: AC

## 2016-08-12 NOTE — ED Notes (Signed)
Pt request PO fluids. MD notified and ok'd patient to drink

## 2016-10-21 ENCOUNTER — Encounter (HOSPITAL_COMMUNITY): Payer: Self-pay

## 2016-10-21 ENCOUNTER — Emergency Department (HOSPITAL_COMMUNITY)
Admission: EM | Admit: 2016-10-21 | Discharge: 2016-10-22 | Disposition: A | Discharge status: Home / Self Care | Payer: Medicare Other | Attending: Emergency Medicine | Admitting: Emergency Medicine

## 2016-10-21 DIAGNOSIS — R062 Wheezing: Secondary | ICD-10-CM | POA: Diagnosis not present

## 2016-10-21 DIAGNOSIS — T7840XA Allergy, unspecified, initial encounter: Secondary | ICD-10-CM | POA: Diagnosis not present

## 2016-10-21 DIAGNOSIS — I10 Essential (primary) hypertension: Secondary | ICD-10-CM | POA: Diagnosis not present

## 2016-10-21 DIAGNOSIS — R21 Rash and other nonspecific skin eruption: Secondary | ICD-10-CM | POA: Diagnosis not present

## 2016-10-21 DIAGNOSIS — L299 Pruritus, unspecified: Secondary | ICD-10-CM | POA: Insufficient documentation

## 2016-10-21 DIAGNOSIS — R0602 Shortness of breath: Secondary | ICD-10-CM | POA: Diagnosis not present

## 2016-10-21 NOTE — ED Provider Notes (Signed)
[facility] DEPT Provider Note   CSN: [id number] Arrival date & time: 10/21/16  2304     History   Chief Complaint Chief Complaint  Patient presents with  . Allergic Reaction    HPI Caitlin Chavez is a 82 y.o. female.  Patient presents after sudden onset itching rash to arms and groin, wheezing and SOB that started about 10 minutes after taking Alka Seltzer at home. She has had allergic reactions prior to this with similar symptoms. No previous reaction to [employer]. She took 50 mg Benadryl and used an EpiPen at 10:00 pm, prior to EMS arrival. Per EMS, she had wheezing with normal O2 saturation and rash when they arrived. They gave her a nebulizer treatment and 125 mg Solumedrol. No lip or tongue swelling at any time.    The history is provided by the patient and the EMS personnel. No language interpreter was used.  Allergic Reaction  Presenting symptoms: rash and wheezing   Presenting symptoms: no difficulty swallowing     Past Medical History:  Diagnosis Date  . Anxiety   . Arthritis    hands  . Colon polyps   . Diverticulosis   . GERD (gastroesophageal reflux disease)   . Hiatal hernia   . Hypertension   . Macular degeneration     There are no active problems to display for this patient.   Past Surgical History:  Procedure Laterality Date  . CHOLECYSTECTOMY    . COLONOSCOPY    . POLYPECTOMY    . VAGINAL HYSTERECTOMY      OB History    No data available       Home Medications    Prior to Admission medications   Medication Sig Start Date End Date Taking? Authorizing Provider  benazepril-hydrochlorthiazide (LOTENSIN HCT) 20-25 MG per tablet Take 1 tablet by mouth daily. 08/04/14   [provider]  carvedilol (COREG) 6.25 MG tablet Take 6.25 mg by mouth 2 (two) times daily. 07/11/15   [provider]  diphenhydrAMINE (BENADRYL) 25 MG tablet Take 1 tablet (25 mg total) by mouth every 6 (six) hours. 08/12/16   Jola Schmidt, MD    famotidine (PEPCID) 20 MG tablet Take 1 tablet (20 mg total) by mouth 2 (two) times daily. 08/12/16   Jola Schmidt, MD  gabapentin (NEURONTIN) 300 MG capsule Take 300 mg by mouth 2 (two) times daily. 07/11/15   [provider]  Multiple Vitamins-Minerals (OCUVITE PO) Take 1 tablet by mouth daily.     [provider]  primidone (MYSOLINE) 50 MG tablet Take 50 mg by mouth at bedtime. 06/14/16   [provider]    Family History Family History  Problem Relation Age of Onset  . Heart attack Father 78  . Alcohol abuse Father   . Heart failure Mother 86       chf  . Heart disease Mother   . Stomach cancer Maternal Grandmother   . Colon cancer Neg Hx     Social History Social History  Substance Use Topics  . Smoking status: Never Smoker  . Smokeless tobacco: Never Used  . Alcohol use No     Allergies   Codeine   Review of Systems Review of Systems  Constitutional: Negative for chills and fever.  HENT: Negative.  Negative for facial swelling and trouble swallowing.   Respiratory: Positive for shortness of breath and wheezing.   Cardiovascular: Negative.  Negative for chest pain.  Gastrointestinal: Negative.  Negative for abdominal pain, nausea  and vomiting.  Musculoskeletal: Negative.  Negative for myalgias.  Skin: Positive for rash.  Neurological: Negative.  Negative for weakness.     Physical Exam Updated Vital Signs BP 139/72 (BP Location: Left Arm)   Pulse 65   Temp 97.6 F (36.4 C) (Oral)   Resp (!) 22   SpO2 95%   Physical Exam  Constitutional: She is oriented to person, place, and time. She appears well-developed and well-nourished.  HENT:  Head: Normocephalic.  Neck: Normal range of motion. Neck supple.  Cardiovascular: Normal rate and regular rhythm.   Pulmonary/Chest: Effort normal and breath sounds normal. She has no wheezes (No wheezing, rales, rhonchi). She has no rales.  Abdominal: Soft. Bowel sounds are normal. There is no  tenderness. There is no rebound and no guarding.  Musculoskeletal: Normal range of motion.  Neurological: She is alert and oriented to person, place, and time.  Skin: Skin is warm and dry. Rash noted. There is erythema.  Plaque like, erythematous rash to arms and thighs bilaterally. Minimally raised.   Psychiatric: She has a normal mood and affect.     ED Treatments / Results  Labs (all labs ordered are listed, but only abnormal results are displayed) Labs Reviewed - No data to display  EKG  EKG Interpretation None       Radiology No results found.  Procedures Procedures (including critical care time)  Medications Ordered in ED Medications - No data to display   Initial Impression / Assessment and Plan / ED Course  I have reviewed the triage vital signs and the nursing notes.  Pertinent labs & imaging results that were available during my care of the patient were reviewed by me and considered in my medical decision making (see chart for details).     Patient arrives after acute allergic reaction tonight. Currently, she is comfortable. She reports rash is improving. No wheezing or SOB.   She took the EpiPen at 10:00 pm. Will observe until 2:00 am. If no recurrent allergic symptoms will discharge home. Will consider observation admission if she has any worsening or change in symptoms.   Patient is observed for 4 hour period and has not developed any recurrent symptoms. She has family at bedside. Rash is almost completely resolved. She is comfortable with discharge home. Return precautions discussed.   Final Clinical Impressions(s) / ED Diagnoses   Final diagnoses:  None   1. Acute allergic reaction  New Prescriptions New Prescriptions   No medications on file     Charlann Lange, Hershal Coria 10/22/16 0149    Veryl Speak, MD 10/22/16 [phone]

## 2016-10-21 NOTE — ED Triage Notes (Addendum)
Pt has an allergic reaction after having some alka seltzer this evening. Her symptoms include SOB, pruritis, hives, and nausea. She gave herself her epipen and 50 mg benadryl at 2200. Denies difficulty swallowing or throat tightness.  5mg  of albuterol and 125 mg of solumedrol given en route. A&Ox4.

## 2016-10-22 MED ORDER — EPINEPHRINE 0.3 MG/0.3ML IJ SOAJ: 0.3000 mg | Freq: Once | INTRAMUSCULAR | 2 refills | Status: DC | PRN

## 2016-10-22 MED ORDER — DIPHENHYDRAMINE HCL 25 MG PO CAPS: 25.0000 mg | ORAL_CAPSULE | Freq: Four times a day (QID) | ORAL | 0 refills | Status: DC | PRN

## 2016-10-22 MED ORDER — PREDNISONE 20 MG PO TABS: 60.0000 mg | ORAL_TABLET | Freq: Every day | ORAL | 0 refills | Status: DC

## 2016-10-22 MED ORDER — FAMOTIDINE 20 MG PO TABS: 20.0000 mg | ORAL_TABLET | Freq: Two times a day (BID) | ORAL | 0 refills | Status: DC

## 2017-02-25 ENCOUNTER — Encounter: Payer: Self-pay | Admitting: Neurology

## 2017-03-07 ENCOUNTER — Ambulatory Visit: Payer: Medicare Other | Admitting: Neurology

## 2017-03-07 NOTE — Progress Notes (Signed)
Subjective:   Caitlin Chavez was seen in consultation in the movement disorder clinic at the request of Aletha Halim., PA-C.  The evaluation is for tremor. This patient is accompanied in the office by her son who supplements the history.  Tremor started approximately 1 year ago and involves the bilateral UE and jaw.  Both hands shake equally.    Tremor is most noticeable when she is angry or upset.   There is a family hx of tremor in her mother and son.  Affected by caffeine:   Unknown because drinks tea all time (drinks 1 starbucks and sweet tea "all day long") Affected by alcohol:  Unknown as doesn't drink EtOH Affected by stress:  Yes.   Affected by fatigue:  No. Spills soup if on spoon:  May or may not Affects ADL's (tying shoes, brushing teeth, etc):  No.  Current/Previously tried tremor medications: primidone, only 50 mg q hs (started this 6 months or more without help), klonopin (didn't tolerate that); xanax (takes 3 times per week and it does help), carvedilol; gabapentin (takes for neuropathy)  Current medications that may exacerbate tremor:  n/a  Outside reports reviewed: historical medical records, office notes and referral letter/letters.  Allergies  Allergen Reactions  . Alka-Seltzer [Aspirin Effervescent] Anaphylaxis and Hives  . Nsaids Anaphylaxis and Hives  . Codeine Hives and Nausea And Vomiting    Outpatient Encounter Medications as of 03/12/2017  Medication Sig  . ALPRAZolam (XANAX) 0.5 MG tablet Take 0.5 mg by mouth 2 (two) times daily as needed for anxiety.  . benazepril-hydrochlorthiazide (LOTENSIN HCT) 20-25 MG per tablet Take 1 tablet by mouth daily.  . Calcium Carbonate-Vitamin D (CALCIUM 600+D PO) Take 1 tablet by mouth 2 (two) times daily.  . carvedilol (COREG) 6.25 MG tablet Take 6.25 mg by mouth 2 (two) times daily with a meal.   . diphenhydrAMINE (BENADRYL) 25 mg capsule Take 1 capsule (25 mg total) by mouth every 6 (six) hours as needed. Take as  directed every 6 hours for 3 days, then as needed for recurrent allergic symptoms. (Patient taking differently: Take 25 mg by mouth as needed. )  . gabapentin (NEURONTIN) 300 MG capsule Take 300 mg by mouth 2 (two) times daily.  . Multiple Vitamins-Minerals (OCUVITE PO) Take 1 tablet by mouth daily.   Marland Kitchen omeprazole (PRILOSEC) 20 MG capsule Take 20 mg by mouth daily.  . primidone (MYSOLINE) 50 MG tablet Take 50 mg by mouth at bedtime.  Marland Kitchen EPINEPHrine 0.3 mg/0.3 mL IJ SOAJ injection Inject 0.3 mLs (0.3 mg total) into the skin once as needed (for allergic reaction). (Patient not taking: Reported on 03/12/2017)  . [DISCONTINUED] aspirin-sod bicarb-citric acid (ALKA-SELTZER) 325 MG TBEF tablet Take 325 mg by mouth every 6 (six) hours as needed (for acid reflex).  . [DISCONTINUED] famotidine (PEPCID) 20 MG tablet Take 1 tablet (20 mg total) by mouth 2 (two) times daily. Take twice daily for 3 days, then as needed for recurrent allergic symptoms  . [DISCONTINUED] predniSONE (DELTASONE) 20 MG tablet Take 3 tablets (60 mg total) by mouth daily.  . [DISCONTINUED] vitamin B-12 (CYANOCOBALAMIN) 500 MCG tablet Take 1,000 mcg by mouth daily.   Facility-Administered Encounter Medications as of 03/12/2017  Medication  . 0.9 %  sodium chloride infusion    Past Medical History:  Diagnosis Date  . Anxiety   . Arthritis    hands  . Colon polyps   . Diverticulosis   . GERD (gastroesophageal reflux disease)   .  Hiatal hernia   . Hypertension   . Macular degeneration   . Peripheral neuropathy     Past Surgical History:  Procedure Laterality Date  . CATARACT EXTRACTION, BILATERAL    . CHOLECYSTECTOMY    . COLONOSCOPY    . LASIK    . POLYPECTOMY    . VAGINAL HYSTERECTOMY      Social History   Socioeconomic History  . Marital status: Married    Spouse name: Not on file  . Number of children: 2  . Years of education: Not on file  . Highest education level: Not on file  Social Needs  . Financial  resource strain: Not on file  . Food insecurity - worry: Not on file  . Food insecurity - inability: Not on file  . Transportation needs - medical: Not on file  . Transportation needs - non-medical: Not on file  Occupational History  . Not on file  Tobacco Use  . Smoking status: Never Smoker  . Smokeless tobacco: Never Used  Substance and Sexual Activity  . Alcohol use: No  . Drug use: No  . Sexual activity: No  Other Topics Concern  . Not on file  Social History Narrative   Widow.  Lives alone.  5 grandchildren and 5 great grandchildren.     Family Status  Relation Name Status  . Father  Deceased  . Mother  Deceased  . MGM  (Not Specified)  . Sister  Alive  . Son Social research officer, government  . Neg Hx  (Not Specified)    Review of Systems Has feet paresthesias.  A complete 10 system ROS was obtained and was negative apart from what is mentioned.   Objective:   VITALS:   Vitals:   03/12/17 1329  BP: 122/72  Pulse: 76  SpO2: 96%  Weight: 175 lb (79.4 kg)  Height: 5\' 5"  (1.651 m)   Gen:  Appears stated age and in NAD. HEENT:  Normocephalic, atraumatic. The mucous membranes are moist. The superficial temporal arteries are without ropiness or tenderness. Cardiovascular: Regular rate and rhythm. Lungs: Clear to auscultation bilaterally. Neck: There are no carotid bruits noted bilaterally.  NEUROLOGICAL:  Orientation:  The patient is alert and oriented x 3.  Recent and remote memory are intact.  Attention span and concentration are normal.  Able to name objects and repeat without trouble.  Fund of knowledge is appropriate Cranial nerves: There is good facial symmetry. The pupils are equal round and reactive to light bilaterally. Fundoscopic exam reveals clear disc margins bilaterally. Extraocular muscles are intact and visual fields are full to confrontational testing. Speech is fluent and clear. Soft palate rises symmetrically and there is no tongue deviation. Hearing is intact to  conversational tone. Tone: Tone is good throughout. Sensation: Sensation is intact to light touch and pinprick throughout (facial, trunk, extremities). Vibration is intact at the bilateral big toe. There is no extinction with double simultaneous stimulation. There is no sensory dermatomal level identified. Coordination:  The patient has no dysdiadichokinesia or dysmetria. Motor: Strength is 5/5 in the bilateral upper and lower extremities.  Shoulder shrug is equal bilaterally.  There is no pronator drift.  There are no fasciculations noted. DTR's: Deep tendon reflexes are 1/4 at the bilateral biceps, triceps, brachioradialis, patella and achilles.  Plantar responses are downgoing bilaterally. Gait and Station: The patient is able to ambulate without difficulty.  She arises out of the chair without the use of her hands.  She has good arm swing.  MOVEMENT EXAM: Tremor:  There is tremor in the UE, noted most significantly with action and much more with intention.  The patient is not able to draw Archimedes spirals without significant difficulty.  There is a tremor at rest on the right hand.  There is jaw tremor.  The patient is not able to pour water from one glass to another without spilling it.  Labs: Reviewed labs from care everywhere.  Last labs were done in 2017.     Assessment/Plan:   1.  Essential Tremor.  -We discussed nature and pathophysiology.  We discussed that this can continue to gradually get worse with time.  We discussed that some medications can worsen this, as can caffeine use.  We discussed medication therapy as well as surgical therapy.  Ultimately, the patient decided to increase primidone to 50 mg twice per day.  I told her that this likely would not be enough.  I also told her that medication would likely not alleviate all of the tremor, but may decrease the intensity.  She was shown videos that are HIPAA compliant of patients who have had surgery.  I will call her in a month  and we will see how she is doing.  Likely, we will increase the medication further at that time.  She will have lab work today, including CBC, chemistry, TSH.  I will plan on seeing her back in follow-up in the next 3 months, sooner should new neurologic issues arise.Much greater than 50% of this visit was spent in counseling and coordinating care.  Total face to face time:  45 min  CC:  Aletha Halim., PA-C

## 2017-03-12 ENCOUNTER — Other Ambulatory Visit: Payer: Medicare Other

## 2017-03-12 ENCOUNTER — Encounter: Payer: Self-pay | Admitting: Neurology

## 2017-03-12 ENCOUNTER — Ambulatory Visit: Payer: Medicare Other | Admitting: Neurology

## 2017-03-12 VITALS — BP 122/72 | HR 76 | Ht 65.0 in | Wt 175.0 lb

## 2017-03-12 DIAGNOSIS — R251 Tremor, unspecified: Secondary | ICD-10-CM | POA: Diagnosis not present

## 2017-03-12 DIAGNOSIS — Z5181 Encounter for therapeutic drug level monitoring: Secondary | ICD-10-CM

## 2017-03-12 MED ORDER — PRIMIDONE 50 MG PO TABS: 50.0000 mg | ORAL_TABLET | Freq: Two times a day (BID) | ORAL | 1 refills | Status: DC

## 2017-03-12 NOTE — Patient Instructions (Signed)
1. Your provider has requested that you have labwork completed today. Please go to [facility] ([id number]) on the second floor of this building before leaving the office today. You do not need to check in. If you are not called within 15 minutes please check with the front desk.   2. Increase Primidone 50 mg twice daily. I will call in a month to see how you are doing.

## 2017-03-13 ENCOUNTER — Telehealth: Payer: Self-pay | Admitting: Neurology

## 2017-03-13 LAB — COMPREHENSIVE METABOLIC PANEL
AG RATIO: 1.6 (calc) (ref 1.0–2.5)
ALT: 9 U/L (ref 6–29)
AST: 14 U/L (ref 10–35)
Albumin: 3.9 g/dL (ref 3.6–5.1)
Alkaline phosphatase (APISO): 67 U/L (ref 33–130)
BILIRUBIN TOTAL: 0.5 mg/dL (ref 0.2–1.2)
BUN/Creatinine Ratio: 13 (calc) (ref 6–22)
BUN: 12 mg/dL (ref 7–25)
CALCIUM: 9.3 mg/dL (ref 8.6–10.4)
CHLORIDE: 99 mmol/L (ref 98–110)
CO2: 28 mmol/L (ref 20–32)
Creat: 0.92 mg/dL — ABNORMAL HIGH (ref 0.60–0.88)
Globulin: 2.5 g/dL (calc) (ref 1.9–3.7)
Glucose, Bld: 102 mg/dL — ABNORMAL HIGH (ref 65–99)
Potassium: 3.8 mmol/L (ref 3.5–5.3)
Sodium: 136 mmol/L (ref 135–146)
Total Protein: 6.4 g/dL (ref 6.1–8.1)

## 2017-03-13 LAB — CBC WITH DIFFERENTIAL/PLATELET
BASOS ABS: 44 {cells}/uL (ref 0–200)
Basophils Relative: 0.5 %
EOS ABS: 131 {cells}/uL (ref 15–500)
Eosinophils Relative: 1.5 %
HEMATOCRIT: 36.6 % (ref 35.0–45.0)
HEMOGLOBIN: 12.6 g/dL (ref 11.7–15.5)
LYMPHS ABS: 1810 {cells}/uL (ref 850–3900)
MCH: 30.5 pg (ref 27.0–33.0)
MCHC: 34.4 g/dL (ref 32.0–36.0)
MCV: 88.6 fL (ref 80.0–100.0)
MPV: 10.2 fL (ref 7.5–12.5)
Monocytes Relative: 6.8 %
NEUTROS ABS: 6125 {cells}/uL (ref 1500–7800)
Neutrophils Relative %: 70.4 %
Platelets: 230 10*3/uL (ref 140–400)
RBC: 4.13 10*6/uL (ref 3.80–5.10)
RDW: 13 % (ref 11.0–15.0)
Total Lymphocyte: 20.8 %
WBC: 8.7 10*3/uL (ref 3.8–10.8)
WBCMIX: 592 {cells}/uL (ref 200–950)

## 2017-03-13 LAB — TSH: TSH: 1.17 mIU/L (ref 0.40–4.50)

## 2017-03-13 NOTE — Telephone Encounter (Signed)
Patient made aware labs okay. 

## 2017-03-13 NOTE — Telephone Encounter (Signed)
-----   Message from [place], DO sent at 03/13/2017  8:26 AM EST ----- I have reviewed all lab results which are normal or stable. Please inform the patient.

## 2017-04-11 ENCOUNTER — Telehealth: Payer: Self-pay | Admitting: Neurology

## 2017-04-11 NOTE — Telephone Encounter (Signed)
-----   Message from Annamaria Helling, [place] sent at 03/12/2017  2:09 PM EST ----- See how she is doing on Primidone BID

## 2017-04-11 NOTE — Telephone Encounter (Signed)
Spoke with patient and she states she is doing much better on Primidone 50 mg 1 BID. She still has times where tremor is worse, but she is overall feeling better.   Right now she has a gout flare up and she is not interested in changing medication further. She will contact us if she changes her mind.

## 2017-04-11 NOTE — Telephone Encounter (Signed)
Left message on machine for patient to call back.  To see how she is doing on Primidone.

## 2017-04-18 ENCOUNTER — Telehealth: Payer: Self-pay | Admitting: Neurology

## 2017-04-18 NOTE — Telephone Encounter (Signed)
When I spoke with patient on 04/11/17 she was doing better on Primidone 50 1 BID. (see previous phone encounter) She is now having more tremors. Please advise on dosage.

## 2017-04-18 NOTE — Telephone Encounter (Signed)
Spoke with patient and made her aware of Dr. Doristine Devoid recommendations.   She will try the increase and let us know how she is feeling.

## 2017-04-18 NOTE — Telephone Encounter (Signed)
Patient states that the medication she is on is not helping she in now has tremors in the jaw and in her lips and would like to talk to someone about this.  The medication is primidone  50 mg  Please call

## 2017-04-18 NOTE — Telephone Encounter (Signed)
Increase to 100 mg in the AM/50 mg in the PM

## 2017-04-22 ENCOUNTER — Other Ambulatory Visit: Payer: Self-pay

## 2017-04-22 ENCOUNTER — Emergency Department (HOSPITAL_COMMUNITY)
Admission: EM | Admit: 2017-04-22 | Discharge: 2017-04-22 | Disposition: A | Discharge status: Home / Self Care | Payer: Medicare Other | Attending: Emergency Medicine | Admitting: Emergency Medicine

## 2017-04-22 ENCOUNTER — Encounter (HOSPITAL_COMMUNITY): Payer: Self-pay | Admitting: Emergency Medicine

## 2017-04-22 DIAGNOSIS — R112 Nausea with vomiting, unspecified: Secondary | ICD-10-CM | POA: Insufficient documentation

## 2017-04-22 DIAGNOSIS — Z79899 Other long term (current) drug therapy: Secondary | ICD-10-CM | POA: Insufficient documentation

## 2017-04-22 DIAGNOSIS — R0682 Tachypnea, not elsewhere classified: Secondary | ICD-10-CM | POA: Insufficient documentation

## 2017-04-22 DIAGNOSIS — T782XXA Anaphylactic shock, unspecified, initial encounter: Secondary | ICD-10-CM | POA: Diagnosis not present

## 2017-04-22 DIAGNOSIS — R000 Tachycardia, unspecified: Secondary | ICD-10-CM | POA: Diagnosis not present

## 2017-04-22 DIAGNOSIS — L299 Pruritus, unspecified: Secondary | ICD-10-CM | POA: Diagnosis not present

## 2017-04-22 DIAGNOSIS — I10 Essential (primary) hypertension: Secondary | ICD-10-CM | POA: Insufficient documentation

## 2017-04-22 DIAGNOSIS — R21 Rash and other nonspecific skin eruption: Secondary | ICD-10-CM | POA: Diagnosis present

## 2017-04-22 DIAGNOSIS — R42 Dizziness and giddiness: Secondary | ICD-10-CM | POA: Insufficient documentation

## 2017-04-22 LAB — BASIC METABOLIC PANEL
ANION GAP: 14 (ref 5–15)
BUN: 14 mg/dL (ref 6–20)
CHLORIDE: 98 mmol/L — AB (ref 101–111)
CO2: 25 mmol/L (ref 22–32)
Calcium: 9.8 mg/dL (ref 8.9–10.3)
Creatinine, Ser: 1.06 mg/dL — ABNORMAL HIGH (ref 0.44–1.00)
GFR calc non Af Amer: 48 mL/min — ABNORMAL LOW (ref >60)
GFR, EST AFRICAN AMERICAN: 56 mL/min — AB (ref >60)
Glucose, Bld: 159 mg/dL — ABNORMAL HIGH (ref 65–99)
Potassium: 4.3 mmol/L (ref 3.5–5.1)
Sodium: 137 mmol/L (ref 135–145)

## 2017-04-22 LAB — CBC
HEMATOCRIT: 45.6 % (ref 36.0–46.0)
HEMOGLOBIN: 15.4 g/dL — AB (ref 12.0–15.0)
MCH: 31 pg (ref 26.0–34.0)
MCHC: 33.8 g/dL (ref 30.0–36.0)
MCV: 91.8 fL (ref 78.0–100.0)
Platelets: 264 10*3/uL (ref 150–400)
RBC: 4.97 MIL/uL (ref 3.87–5.11)
RDW: 13.2 % (ref 11.5–15.5)
WBC: 15.2 10*3/uL — AB (ref 4.0–10.5)

## 2017-04-22 MED ORDER — ONDANSETRON HCL 4 MG/2ML IJ SOLN
4.0000 mg | Freq: Once | INTRAMUSCULAR | Status: AC
  Administered 2017-04-22: 4 mg via INTRAVENOUS
  Filled 2017-04-22: qty 2

## 2017-04-22 MED ORDER — DIPHENHYDRAMINE HCL 50 MG/ML IJ SOLN
INTRAMUSCULAR | Status: AC
  Filled 2017-04-22: qty 1

## 2017-04-22 MED ORDER — DIPHENHYDRAMINE HCL 50 MG/ML IJ SOLN
50.0000 mg | Freq: Once | INTRAMUSCULAR | Status: AC
  Administered 2017-04-22: 50 mg via INTRAVENOUS

## 2017-04-22 MED ORDER — FAMOTIDINE IN NACL 20-0.9 MG/50ML-% IV SOLN
20.0000 mg | Freq: Once | INTRAVENOUS | Status: AC
  Administered 2017-04-22: 20 mg via INTRAVENOUS
  Filled 2017-04-22: qty 50

## 2017-04-22 MED ORDER — PREDNISONE 20 MG PO TABS: 40.0000 mg | ORAL_TABLET | Freq: Every day | ORAL | 0 refills | Status: DC

## 2017-04-22 MED ORDER — FAMOTIDINE 20 MG PO TABS: 20.0000 mg | ORAL_TABLET | Freq: Two times a day (BID) | ORAL | 0 refills | Status: DC

## 2017-04-22 MED ORDER — METHYLPREDNISOLONE SODIUM SUCC 125 MG IJ SOLR
125.0000 mg | Freq: Once | INTRAMUSCULAR | Status: AC
  Administered 2017-04-22: 125 mg via INTRAVENOUS
  Filled 2017-04-22: qty 2

## 2017-04-22 MED ORDER — EPINEPHRINE 0.3 MG/0.3ML IJ SOAJ
INTRAMUSCULAR | Status: AC
  Filled 2017-04-22: qty 0.3

## 2017-04-22 MED ORDER — FAMOTIDINE 20 MG PO TABS: 20.0000 mg | ORAL_TABLET | Freq: Once | ORAL | Status: DC

## 2017-04-22 MED ORDER — EPINEPHRINE 0.3 MG/0.3ML IJ SOAJ
INTRAMUSCULAR | Status: AC
  Administered 2017-04-22: 0.3 mg
  Filled 2017-04-22: qty 0.3

## 2017-04-22 NOTE — Discharge Instructions (Addendum)
1. Medications: Prednisone, Benadryl every 6 hours as needed for itching or rash, Pepcid, usual home medications 2. Treatment: rest, drink plenty of fluids, take medications as prescribed 3. Follow Up: Please followup with your primary doctor and allergist in 3 days for discussion of your diagnoses and further evaluation after today's visit; if you do not have a primary care doctor use the resource guide provided to find one; followup with dermatology as needed; Return to the ER for difficulty breathing, return of allergic reaction or other concerning symptoms

## 2017-04-22 NOTE — ED Triage Notes (Signed)
Pt reports sudden onset of rash to arms, chest, and abdomen at 2300 and took 50mg  of Benadryl right after. Pt unknown of any different medications.

## 2017-04-22 NOTE — ED Provider Notes (Signed)
[facility] DEPT Provider Note   CSN: [id number] Arrival date & time: 04/22/17  0010     History   Chief Complaint Chief Complaint  Patient presents with  . Allergic Reaction    HPI  Caitlin Chavez is a 83 y.o. female with a hx of anxiety, arthritis, GERD, hypertension, peripheral neuropathy presents to the Emergency Department complaining of gradual, persistent, progressively worsening allergic reaction onset around midnight tonight.  Patient reports she has had 2 episodes of anaphylaxis in late 2018 with unknown etiology.  Both required epinephrine and medications however she did not need to be admitted for this.  She reports that several months ago she saw an allergist who could not find a reason for her allergic reaction.  She reports that previously she has had vomiting, shortness of breath and syncope with her anaphylaxis.  Patient took 50 mg of oral Benadryl prior to arrival.  She did not use her EpiPen.  No known aggravating or alleviating factors.  She reports the Benadryl did not help her symptoms.  She denies fevers or chills, weakness, syncope.  Patient denies new foods or environmental exposures.   The history is provided by the patient, medical records and a relative. No language interpreter was used.    Past Medical History:  Diagnosis Date  . Anxiety   . Arthritis    hands  . Colon polyps   . Diverticulosis   . GERD (gastroesophageal reflux disease)   . Hiatal hernia   . Hypertension   . Macular degeneration   . Peripheral neuropathy     There are no active problems to display for this patient.   Past Surgical History:  Procedure Laterality Date  . CATARACT EXTRACTION, BILATERAL    . CHOLECYSTECTOMY    . COLONOSCOPY    . LASIK    . POLYPECTOMY    . VAGINAL HYSTERECTOMY       OB History   None      Home Medications    Prior to Admission medications   Medication Sig Start Date End Date Taking? Authorizing Provider   allopurinol (ZYLOPRIM) 100 MG tablet Take 100 mg by mouth daily. 04/05/17  Yes [provider]  ALPRAZolam Duanne Moron) 0.5 MG tablet Take 0.5 mg by mouth 2 (two) times daily as needed for anxiety.   Yes [provider]  Calcium Carbonate-Vitamin D (CALCIUM 600+D PO) Take 1 tablet by mouth daily.    Yes [provider]  carvedilol (COREG) 6.25 MG tablet Take 6.25 mg by mouth 2 (two) times daily with a meal.  07/11/15  Yes [provider]  diphenhydrAMINE (BENADRYL) 25 mg capsule Take 1 capsule (25 mg total) by mouth every 6 (six) hours as needed. Take as directed every 6 hours for 3 days, then as needed for recurrent allergic symptoms. Patient taking differently: Take 25 mg by mouth as needed.  10/22/16  Yes Charlann Lange, PA-C  EPINEPHrine 0.3 mg/0.3 mL IJ SOAJ injection Inject 0.3 mLs (0.3 mg total) into the skin once as needed (for allergic reaction). 10/22/16  Yes Upstill, Nehemiah Settle, PA-C  gabapentin (NEURONTIN) 300 MG capsule Take 300 mg by mouth 2 (two) times daily. 07/11/15  Yes [provider]  Multiple Vitamins-Minerals (OCUVITE PO) Take 1 tablet by mouth daily.    Yes [provider]  omeprazole (PRILOSEC) 20 MG capsule Take 20 mg by mouth daily.   Yes [provider]  primidone (MYSOLINE) 50 MG tablet Take 1 tablet (50 mg  total) by mouth 2 (two) times daily. Patient taking differently: Take 50-100 mg by mouth 2 (two) times daily. 100 mg every morning and 50 mg every night 03/12/17  Yes Tat, Eustace Quail, DO  famotidine (PEPCID) 20 MG tablet Take 1 tablet (20 mg total) by mouth 2 (two) times daily. 04/22/17   Kewanna Kasprzak, Jarrett Soho, PA-C  predniSONE (DELTASONE) 20 MG tablet Take 2 tablets (40 mg total) by mouth daily. 04/22/17   Mareesa Gathright, Jarrett Soho, PA-C    Family History Family History  Problem Relation Age of Onset  . Heart attack Father 72  . Alcohol abuse Father   . Heart failure Mother 42       chf  . Heart disease Mother   . Stomach  cancer Maternal Grandmother   . Prostate cancer Son   . Colon cancer Neg Hx     Social History Social History   Tobacco Use  . Smoking status: Never Smoker  . Smokeless tobacco: Never Used  Substance Use Topics  . Alcohol use: No  . Drug use: No     Allergies   Alka-seltzer [aspirin effervescent]; Nsaids; Codeine; Beef-derived products; and Pork-derived products   Review of Systems Review of Systems  Constitutional: Negative for appetite change, diaphoresis, fatigue, fever and unexpected weight change.  HENT: Negative for mouth sores.   Eyes: Negative for visual disturbance.  Respiratory: Negative for cough, chest tightness, shortness of breath and wheezing.   Cardiovascular: Negative for chest pain.  Gastrointestinal: Positive for nausea and vomiting. Negative for abdominal pain, constipation and diarrhea.  Endocrine: Negative for polydipsia, polyphagia and polyuria.  Genitourinary: Negative for dysuria, frequency, hematuria and urgency.  Musculoskeletal: Negative for back pain and neck stiffness.  Skin: Positive for rash.  Allergic/Immunologic: Negative for immunocompromised state.  Neurological: Positive for light-headedness. Negative for syncope and headaches.  Hematological: Does not bruise/bleed easily.  Psychiatric/Behavioral: Negative for sleep disturbance. The patient is not nervous/anxious.      Physical Exam Updated Vital Signs BP (!) 142/108 (BP Location: Left Arm)   Pulse 99   Temp (!) 97.2 F (36.2 C) (Axillary)   Resp (!) 22   Ht 5\' 5"  (1.651 m)   Wt 77.1 kg (170 lb)   SpO2 98%   BMI 28.29 kg/m   Physical Exam  Constitutional: She is oriented to person, place, and time. She appears well-developed and well-nourished. She appears distressed.  Patient very anxious appearing, vomiting  HENT:  Head: Normocephalic and atraumatic.  Right Ear: Tympanic membrane, external ear and ear canal normal.  Left Ear: Tympanic membrane, external ear and ear  canal normal.  Nose: Nose normal. No mucosal edema or rhinorrhea.  Mouth/Throat: Uvula is midline. No uvula swelling. No oropharyngeal exudate, posterior oropharyngeal edema, posterior oropharyngeal erythema or tonsillar abscesses.  No swelling of the uvula or oropharynx  Eyes: Conjunctivae are normal.  Neck: Normal range of motion.  Patent airway No stridor; normal phonation Handling secretions without difficulty  Cardiovascular: Normal heart sounds and intact distal pulses. Tachycardia present.  No murmur heard. Pulses:      Radial pulses are 2+ on the right side, and 2+ on the left side.       Dorsalis pedis pulses are 2+ on the right side, and 2+ on the left side.  Pulmonary/Chest: Effort normal and breath sounds normal. No stridor. Tachypnea noted. No respiratory distress. She has no wheezes.  Tachypneic but no wheezes or rhonchi  Abdominal: Soft. Bowel sounds are normal. There is no tenderness.  Musculoskeletal:  Normal range of motion. She exhibits no edema.  Neurological: She is alert and oriented to person, place, and time.  Skin: Skin is warm and dry. Rash noted. She is not diaphoretic.  Patient's entire body is covered with urticaria  Psychiatric: She has a normal mood and affect.  Nursing note and vitals reviewed.    ED Treatments / Results  Labs (all labs ordered are listed, but only abnormal results are displayed) Labs Reviewed  CBC - Abnormal; Notable for the following components:      Result Value   WBC 15.2 (*)    Hemoglobin 15.4 (*)    All other components within normal limits  BASIC METABOLIC PANEL - Abnormal; Notable for the following components:   Chloride 98 (*)    Glucose, Bld 159 (*)    Creatinine, Ser 1.06 (*)    GFR calc non Af Amer 48 (*)    GFR calc Af Amer 56 (*)    All other components within normal limits    EKG EKG Interpretation  Date/Time:  Monday April 22 2017 00:39:13 EDT Ventricular Rate:  88 PR Interval:    QRS Duration: 94 QT  Interval:  386 QTC Calculation: 467 R Axis:   44 Text Interpretation:  Sinus rhythm Atrial premature complexes Borderline low voltage, extremity leads When compared with ECG of 04/16/2001, Premature atrial complexes are now present Confirmed by Delora Fuel ([id number]) on 04/22/2017 12:53:28 AM   Procedures .Critical Care Performed by: Abigail Butts, PA-C Authorized by: Abigail Butts, PA-C   Critical care provider statement:    Critical care time (minutes):  45   Critical care time was exclusive of:  Separately billable procedures and treating other patients and teaching time   Critical care was necessary to treat or prevent imminent or life-threatening deterioration of the following conditions:  Circulatory failure and shock   Critical care was time spent personally by me on the following activities:  Ordering and performing treatments and interventions, ordering and review of laboratory studies, re-evaluation of patient's condition, review of old charts, pulse oximetry, obtaining history from patient or surrogate, examination of patient, evaluation of patient's response to treatment, discussions with consultants and development of treatment plan with patient or surrogate   I assumed direction of critical care for this patient from another provider in my specialty: no     (including critical care time)  Medications Ordered in ED Medications  EPINEPHrine (EPI-PEN) 0.3 mg/0.3 mL injection (has no administration in time range)  diphenhydrAMINE (BENADRYL) 50 MG/ML injection (has no administration in time range)  ondansetron (ZOFRAN) injection 4 mg (4 mg Intravenous Given 04/22/17 0032)  famotidine (PEPCID) IVPB 20 mg premix (0 mg Intravenous Stopped 04/22/17 0101)  EPINEPHrine (EPI-PEN) 0.3 mg/0.3 mL injection (0.3 mg  Given 04/22/17 0035)  diphenhydrAMINE (BENADRYL) injection 50 mg (50 mg Intravenous Given 04/22/17 0030)  methylPREDNISolone sodium succinate (SOLU-MEDROL) 125 mg/2 mL  injection 125 mg (125 mg Intravenous Given 04/22/17 0049)     Initial Impression / Assessment and Plan / ED Course  I have reviewed the triage vital signs and the nursing notes.  Pertinent labs & imaging results that were available during my care of the patient were reviewed by me and considered in my medical decision making (see chart for details).  Clinical Course as of Apr 22 521  Mon Apr 22, 2017  0244 Patient resting comfortably.  Her rash has improved significantly.  No difficulty breathing.  Handling secretions without difficulty.  No additional emesis.  No hypoxia.   [HM]  [id number] Patient well-appearing.  Complete resolution of her rash and all allergic reaction symptoms.   [HM]    Clinical Course User Index [HM] Brynnlie Unterreiner, Gwenlyn Perking    Vision presents with anaphylaxis.  Urticaria covering her entire body and she is vomiting.  She is tachypneic but does not have wheezes or rhonchi.  She reports that she is very lightheaded.  EpiPen IM given immediately.  This was followed by Benadryl, Solu-Medrol and Pepcid.  She is critically ill. Patient was significant improvement within 3-5 minutes.  Patient additionally given Zofran.  Emesis resolved.  Patient denies difficulty breathing at this time.  Will monitor.  5:23 AM Patient has done well while here in the emergency department.  No additional return of symptoms.  She is alert and oriented.  Long discussion with patient about using EpiPen at home and contacting EMS.  She states understanding and is in agreement with the plan.  The patient was discussed with and seen by Dr. Roxanne Mins who agrees with the treatment plan.   Final Clinical Impressions(s) / ED Diagnoses   Final diagnoses:  Anaphylaxis, initial encounter    ED Discharge Orders        Ordered    predniSONE (DELTASONE) 20 MG tablet  Daily     04/22/17 0518    famotidine (PEPCID) 20 MG tablet  2 times daily     04/22/17 0519       Zailah Zagami, Jarrett Soho,  PA-C 65/53/74 [id number]    Delora Fuel, MD 78/67/54 [phone]

## 2017-05-10 ENCOUNTER — Telehealth: Payer: Self-pay | Admitting: Neurology

## 2017-05-10 NOTE — Telephone Encounter (Signed)
Patient Increased Primidone to 100 mg in the AM/50 mg in the PM on 04/18/17 and still having symptoms. Please advise.

## 2017-05-10 NOTE — Telephone Encounter (Signed)
We can increase med to 100 mg bid but remind her that I told her that only surgery would take away her degree of tremor.  Med will just be expected to decrease frequency/amplitude.  Can't increase more than this without seeing her

## 2017-05-10 NOTE — Telephone Encounter (Signed)
Patient wants to speak to someone about the primidone she is taking three a day and still having some issues such as teeth shattering and shaking

## 2017-05-13 NOTE — Telephone Encounter (Signed)
Called and spoke with Pt. She said she is already taking #2 50mg  BID, said her teeth chatter. She wanted to move her June appt up. Dawn is calling Pt to schedule.

## 2017-05-13 NOTE — Telephone Encounter (Signed)
Spoke with patient. Made her aware. She has increased to 2 BID and will not increase further than that.

## 2017-07-08 NOTE — Progress Notes (Signed)
Subjective:   Caitlin Chavez was seen in consultation in the movement disorder clinic at the request of Aletha Halim., PA-C.  The evaluation is for tremor. This patient is accompanied in the office by her son who supplements the history.  Tremor started approximately 1 year ago and involves the bilateral UE and jaw.  Both hands shake equally.    Tremor is most noticeable when she is angry or upset.   There is a family hx of tremor in her mother and son.  Affected by caffeine:   Unknown because drinks tea all time (drinks 1 starbucks and sweet tea "all day long") Affected by alcohol:  Unknown as doesn't drink EtOH Affected by stress:  Yes.   Affected by fatigue:  No. Spills soup if on spoon:  May or may not Affects ADL's (tying shoes, brushing teeth, etc):  No.  Current/Previously tried tremor medications: primidone, only 50 mg q hs (started this 6 months or more without help), klonopin (didn't tolerate that); xanax (takes 3 times per week and it does help), carvedilol; gabapentin (takes for neuropathy)  Current medications that may exacerbate tremor:  n/a  Outside reports reviewed: historical medical records, office notes and referral letter/letters.  07/09/17 update: Patient is seen today in follow-up.  This patient is accompanied in the office by her son who supplements the history.Tramadol was increased last visit to 50 mg twice per day.  We called her about a month after we did that and she stated that she was doing much better.  She did not want to increase medication further due to other medical problems that she was having (issues with gout).  However, she called me about a week after that and stated that tremors were worse.  We increased her primidone to 100 mg in the morning and 50 mg at night and later when she called, will increase to to 100 mg twice per day, which is what she is on now.  She states that she is doing well and "I can deal with this."  Patient was in the emergency  room on April 22, 2017 after having an anaphylaxis episode.  She was vomiting.  She had urticaria.  She was given epi and symptoms quickly resolved.  Allergies  Allergen Reactions  . Alka-Seltzer [Aspirin Effervescent] Anaphylaxis and Hives  . Nsaids Anaphylaxis and Hives  . Other Anaphylaxis and Rash  . Codeine Hives and Nausea And Vomiting  . Beef-Derived Products   . Pork-Derived Products     Outpatient Encounter Medications as of 07/09/2017  Medication Sig  . allopurinol (ZYLOPRIM) 100 MG tablet Take 100 mg by mouth daily.  Marland Kitchen ALPRAZolam (XANAX) 0.5 MG tablet Take 0.5 mg by mouth 2 (two) times daily as needed for anxiety.  . benazepril-hydrochlorthiazide (LOTENSIN HCT) 20-25 MG tablet TK 1 T PO D  . Calcium Carbonate-Vitamin D (CALCIUM 600+D PO) Take 1 tablet by mouth daily.   . carvedilol (COREG) 6.25 MG tablet Take 6.25 mg by mouth 2 (two) times daily with a meal.   . diphenhydrAMINE (BENADRYL) 25 mg capsule Take 1 capsule (25 mg total) by mouth every 6 (six) hours as needed. Take as directed every 6 hours for 3 days, then as needed for recurrent allergic symptoms. (Patient taking differently: Take 25 mg by mouth as needed. )  . EPINEPHrine 0.3 mg/0.3 mL IJ SOAJ injection Inject 0.3 mLs (0.3 mg total) into the skin once as needed (for allergic reaction).  . famotidine (PEPCID) 20 MG  tablet Take 1 tablet (20 mg total) by mouth 2 (two) times daily.  Marland Kitchen gabapentin (NEURONTIN) 300 MG capsule Take 300 mg by mouth 2 (two) times daily.  . meloxicam (MOBIC) 15 MG tablet TK 1/2-1 T PO D WITH FOOD FOR ARTHRITIS PAIN  . Multiple Vitamins-Minerals (OCUVITE PO) Take 1 tablet by mouth daily.   Marland Kitchen omeprazole (PRILOSEC) 20 MG capsule Take 20 mg by mouth daily.  . predniSONE (DELTASONE) 20 MG tablet Take 2 tablets (40 mg total) by mouth daily.  . primidone (MYSOLINE) 50 MG tablet Take 2 tablets (100 mg total) by mouth 2 (two) times daily.  . [DISCONTINUED] primidone (MYSOLINE) 50 MG tablet Take 1 tablet  (50 mg total) by mouth 2 (two) times daily. (Patient taking differently: Take 50-100 mg by mouth 2 (two) times daily. 100 mg every morning and 50 mg every night)  . [DISCONTINUED] 0.9 %  sodium chloride infusion    No facility-administered encounter medications on file as of 07/09/2017.     Past Medical History:  Diagnosis Date  . Anxiety   . Arthritis    hands  . Colon polyps   . Diverticulosis   . GERD (gastroesophageal reflux disease)   . Hiatal hernia   . Hypertension   . Macular degeneration   . Peripheral neuropathy     Past Surgical History:  Procedure Laterality Date  . CATARACT EXTRACTION, BILATERAL    . CHOLECYSTECTOMY    . COLONOSCOPY    . LASIK    . POLYPECTOMY    . VAGINAL HYSTERECTOMY      Social History   Socioeconomic History  . Marital status: Married    Spouse name: Not on file  . Number of children: 2  . Years of education: Not on file  . Highest education level: Not on file  Occupational History  . Occupation: retired    Comment: Visual merchandiser  . Financial resource strain: Not on file  . Food insecurity:    Worry: Not on file    Inability: Not on file  . Transportation needs:    Medical: Not on file    Non-medical: Not on file  Tobacco Use  . Smoking status: Never Smoker  . Smokeless tobacco: Never Used  Substance and Sexual Activity  . Alcohol use: No  . Drug use: No  . Sexual activity: Never  Lifestyle  . Physical activity:    Days per week: Not on file    Minutes per session: Not on file  . Stress: Not on file  Relationships  . Social connections:    Talks on phone: Not on file    Gets together: Not on file    Attends religious service: Not on file    Active member of club or organization: Not on file    Attends meetings of clubs or organizations: Not on file    Relationship status: Not on file  . Intimate partner violence:    Fear of current or ex partner: Not on file    Emotionally abused: Not on file    Physically  abused: Not on file    Forced sexual activity: Not on file  Other Topics Concern  . Not on file  Social History Narrative   Widow.  Lives alone.  5 grandchildren and 5 great grandchildren.     Family Status  Relation Name Status  . Father  Deceased  . Mother  Deceased  . MGM  (Not Specified)  . Sister  Alive  . Son x2 Alive  . Neg Hx  (Not Specified)    Review of Systems Review of Systems  Constitutional: Negative.   HENT: Negative.   Eyes: Negative.   Respiratory: Negative.   Cardiovascular: Negative.   Gastrointestinal: Negative.   Genitourinary: Negative.   Skin: Negative.      Objective:   VITALS:   Vitals:   07/09/17 1505  BP: 104/64  Pulse: 77  SpO2: 98%  Weight: 175 lb (79.4 kg)  Height: 5\' 5"  (1.651 m)   Gen:  Appears stated age and in NAD. GEN:  The patient appears stated age and is in NAD. HEENT:  Normocephalic, atraumatic.  The mucous membranes are moist. The superficial temporal arteries are without ropiness or tenderness. CV:  RRR Lungs:  CTAB Neck/HEME:  There are no carotid bruits bilaterally.  Neurological examination:  Orientation: The patient is alert and oriented x3. Tone: normal Coordination: good Cranial nerves: There is good facial symmetry. The speech is fluent and clear. Soft palate rises symmetrically and there is no tongue deviation. Hearing is intact to conversational tone. Sensation: Sensation is intact to light touch throughout Motor: Strength is 5/5 in the bilateral upper and lower extremities.   Shoulder shrug is equal and symmetric.  There is no pronator drift.  Movement examination: Abnormal movements: There is mild tremor the outstretched hands that slightly increases with intention.  She is still tremulous with Archimedes spirals, but they are better than previous visit.   Labs:    Chemistry      Component Value Date/Time   NA 137 04/22/2017 0026   K 4.3 04/22/2017 0026   CL 98 (L) 04/22/2017 0026   CO2 25  04/22/2017 0026   BUN 14 04/22/2017 0026   CREATININE 1.06 (H) 04/22/2017 0026   CREATININE 0.92 (H) 03/12/2017 1423      Component Value Date/Time   CALCIUM 9.8 04/22/2017 0026   AST 14 03/12/2017 1423   ALT 9 03/12/2017 1423   BILITOT 0.5 03/12/2017 1423     Lab Results  Component Value Date   TSH 1.17 03/12/2017   Lab Results  Component Value Date   WBC 15.2 (H) 04/22/2017   HGB 15.4 (H) 04/22/2017   HCT 45.6 04/22/2017   MCV 91.8 04/22/2017   PLT 264 04/22/2017        Assessment/Plan:   1.  Essential Tremor.  -Patient feels that she is doing fairly well on primidone, 100 mg twice per day.  She would like to stay on this dosage.  Meds were refilled today.  F/u 6-7 months  CC:  Aletha Halim., PA-C

## 2017-07-09 ENCOUNTER — Ambulatory Visit: Payer: Medicare Other | Admitting: Neurology

## 2017-07-09 ENCOUNTER — Encounter: Payer: Self-pay | Admitting: Neurology

## 2017-07-09 ENCOUNTER — Encounter

## 2017-07-09 VITALS — BP 104/64 | HR 77 | Ht 65.0 in | Wt 175.0 lb

## 2017-07-09 DIAGNOSIS — G250 Essential tremor: Secondary | ICD-10-CM

## 2017-07-09 MED ORDER — PRIMIDONE 50 MG PO TABS: 100.0000 mg | ORAL_TABLET | Freq: Two times a day (BID) | ORAL | 1 refills | Status: DC

## 2017-07-09 NOTE — Patient Instructions (Signed)
Good to see you   

## 2017-12-30 ENCOUNTER — Other Ambulatory Visit: Payer: Self-pay | Admitting: Neurology

## 2017-12-30 MED ORDER — PRIMIDONE 50 MG PO TABS: 100.0000 mg | ORAL_TABLET | Freq: Two times a day (BID) | ORAL | 1 refills | Status: DC

## 2018-01-10 NOTE — Progress Notes (Signed)
Subjective:   Caitlin Chavez was seen in consultation in the movement disorder clinic at the request of Aletha Halim., PA-C.  The evaluation is for tremor. This patient is accompanied in the office by her son who supplements the history.  Tremor started approximately 1 year ago and involves the bilateral UE and jaw.  Both hands shake equally.    Tremor is most noticeable when she is angry or upset.   There is a family hx of tremor in her mother and son.  Affected by caffeine:   Unknown because drinks tea all time (drinks 1 starbucks and sweet tea "all day long") Affected by alcohol:  Unknown as doesn't drink EtOH Affected by stress:  Yes.   Affected by fatigue:  No. Spills soup if on spoon:  May or may not Affects ADL's (tying shoes, brushing teeth, etc):  No.  Current/Previously tried tremor medications: primidone, only 50 mg q hs (started this 6 months or more without help), klonopin (didn't tolerate that); xanax (takes 3 times per week and it does help), carvedilol; gabapentin (takes for neuropathy)  Current medications that may exacerbate tremor:  n/a  Outside reports reviewed: historical medical records, office notes and referral letter/letters.  07/09/17 update: Patient is seen today in follow-up.  This patient is accompanied in the office by her son who supplements the history.primidone was increased last visit to 50 mg twice per day.  We called her about a month after we did that and she stated that she was doing much better.  She did not want to increase medication further due to other medical problems that she was having (issues with gout).  However, she called me about a week after that and stated that tremors were worse.  We increased her primidone to 100 mg in the morning and 50 mg at night and later when she called, will increase to to 100 mg twice per day, which is what she is on now.  She states that she is doing well and "I can deal with this."  Patient was in the emergency  room on April 22, 2017 after having an anaphylaxis episode.  She was vomiting.  She had urticaria.  She was given epi and symptoms quickly resolved.  01/14/18 update: Patient is seen today in follow-up for tremor, accompanied by her son who supplements history.  Patient is on primidone, 100 mg twice per day.  She does think that this helps.  She reports that she is having a bad tremor day today.  overall tremor has been stable, with better and worse days.  She hates that she sometimes has jaw tremor.  Records are reviewed since last visit.  Saw her physician assistant on October 24, 2017.  Allergies  Allergen Reactions  . Alka-Seltzer [Aspirin Effervescent] Anaphylaxis and Hives  . Nsaids Anaphylaxis and Hives  . Other Anaphylaxis and Rash  . Codeine Hives and Nausea And Vomiting  . Beef-Derived Products   . Pork-Derived Products     Outpatient Encounter Medications as of 01/14/2018  Medication Sig  . ALPRAZolam (XANAX) 0.5 MG tablet Take 0.5 mg by mouth 2 (two) times daily as needed for anxiety.  . benazepril-hydrochlorthiazide (LOTENSIN HCT) 20-25 MG tablet TK 1 T PO D  . Calcium Carbonate-Vitamin D (CALCIUM 600+D PO) Take 1 tablet by mouth daily.   . carvedilol (COREG) 6.25 MG tablet Take 6.25 mg by mouth 2 (two) times daily with a meal.   . gabapentin (NEURONTIN) 300 MG capsule Take  300 mg by mouth 2 (two) times daily.  . Multiple Vitamins-Minerals (OCUVITE PO) Take 1 tablet by mouth daily.   Marland Kitchen omeprazole (PRILOSEC) 20 MG capsule Take 20 mg by mouth daily.  . primidone (MYSOLINE) 50 MG tablet Take 2 tablets (100 mg total) by mouth 2 (two) times daily.  Marland Kitchen EPINEPHrine 0.3 mg/0.3 mL IJ SOAJ injection Inject 0.3 mLs (0.3 mg total) into the skin once as needed (for allergic reaction). (Patient not taking: Reported on 01/14/2018)  . [DISCONTINUED] allopurinol (ZYLOPRIM) 100 MG tablet Take 100 mg by mouth daily.  . [DISCONTINUED] diphenhydrAMINE (BENADRYL) 25 mg capsule Take 1 capsule (25 mg  total) by mouth every 6 (six) hours as needed. Take as directed every 6 hours for 3 days, then as needed for recurrent allergic symptoms. (Patient taking differently: Take 25 mg by mouth as needed. )  . [DISCONTINUED] famotidine (PEPCID) 20 MG tablet Take 1 tablet (20 mg total) by mouth 2 (two) times daily.  . [DISCONTINUED] meloxicam (MOBIC) 15 MG tablet TK 1/2-1 T PO D WITH FOOD FOR ARTHRITIS PAIN  . [DISCONTINUED] predniSONE (DELTASONE) 20 MG tablet Take 2 tablets (40 mg total) by mouth daily.   No facility-administered encounter medications on file as of 01/14/2018.     Past Medical History:  Diagnosis Date  . Anxiety   . Arthritis    hands  . Colon polyps   . Diverticulosis   . GERD (gastroesophageal reflux disease)   . Hiatal hernia   . Hypertension   . Macular degeneration   . Peripheral neuropathy     Past Surgical History:  Procedure Laterality Date  . CATARACT EXTRACTION, BILATERAL    . CHOLECYSTECTOMY    . COLONOSCOPY    . LASIK    . POLYPECTOMY    . VAGINAL HYSTERECTOMY      Social History   Socioeconomic History  . Marital status: Married    Spouse name: Not on file  . Number of children: 2  . Years of education: Not on file  . Highest education level: Not on file  Occupational History  . Occupation: retired    Comment: Visual merchandiser  . Financial resource strain: Not on file  . Food insecurity:    Worry: Not on file    Inability: Not on file  . Transportation needs:    Medical: Not on file    Non-medical: Not on file  Tobacco Use  . Smoking status: Never Smoker  . Smokeless tobacco: Never Used  Substance and Sexual Activity  . Alcohol use: No  . Drug use: No  . Sexual activity: Never  Lifestyle  . Physical activity:    Days per week: Not on file    Minutes per session: Not on file  . Stress: Not on file  Relationships  . Social connections:    Talks on phone: Not on file    Gets together: Not on file    Attends religious service:  Not on file    Active member of club or organization: Not on file    Attends meetings of clubs or organizations: Not on file    Relationship status: Not on file  . Intimate partner violence:    Fear of current or ex partner: Not on file    Emotionally abused: Not on file    Physically abused: Not on file    Forced sexual activity: Not on file  Other Topics Concern  . Not on file  Social History Narrative  Widow.  Lives alone.  5 grandchildren and 5 great grandchildren.     Family Status  Relation Name Status  . Father  Deceased  . Mother  Deceased  . MGM  (Not Specified)  . Sister  Alive  . Son Social research officer, government  . Neg Hx  (Not Specified)    Review of Systems Review of Systems  Constitutional: Negative.   HENT: Negative.   Eyes: Negative.   Cardiovascular: Negative.   Gastrointestinal: Negative.   Genitourinary: Negative.   Skin: Negative.   Endo/Heme/Allergies: Negative.      Objective:   VITALS:   Vitals:   01/14/18 1302  BP: 126/70  Pulse: 88  SpO2: 96%  Weight: 179 lb (81.2 kg)  Height: 5\' 5"  (1.651 m)   Gen:  Appears stated age and in NAD. GEN:  The patient appears stated age and is in NAD. HEENT:  Normocephalic, atraumatic.  The mucous membranes are moist. The superficial temporal arteries are without ropiness or tenderness. CV:  RRR Lungs:  CTAB Neck/HEME:  There are no carotid bruits bilaterally.  Neurological examination:  Orientation: The patient is alert and oriented x3. Tone: normal Coordination: good Cranial nerves: There is good facial symmetry. The speech is fluent and clear. Soft palate rises symmetrically and there is no tongue deviation. Hearing is intact to conversational tone. Sensation: Sensation is intact to light touch throughout Motor: Strength is 5/5 in the bilateral upper and lower extremities.   Shoulder shrug is equal and symmetric.  There is no pronator drift.  Movement examination: Abnormal movements: There is mild to mod to  tremor in the bilateral UE.  It increases with intention.  she has difficulty with archimedes spirals.   Labs:  Patient had lab work dated October 30, 2017.  TSH was 1.771.  Sodium was 134, potassium 4.1, chloride 95, CO2 33, BUN 20, creatinine 0.97, glucose 99, AST 13, ALT 9.     Assessment/Plan:   1.  Essential Tremor.  -she would like to slightly increase the primidone 50 mg - 3 in the AM and 2 tablets at night.  Risks, benefits, side effects and alternative therapies were discussed.  The opportunity to ask questions was given and they were answered to the best of my ability.  The patient expressed understanding and willingness to follow the outlined treatment protocols.  -discussed dbs at length.  I talked to the patient about the logistics associated with DBS therapy.  I talked to the patient about risks/benefits/side effects of DBS therapy.  We talked about risks which included but were not limited to infection, paralysis, intraoperative seizure, death, stroke, bleeding around the electrode.   I talked to patient about fiducial placement 1 week prior to DBS therapy.  I talked to the patient about what to expect in the operating room, including the fact that this is an awake surgery.  We talked about battery placement as well as which is done under general anesthesia, generally approximately one week following the initial surgery.  We also talked about the fact that the patient will need to be off of medications for surgery.  The patient and family were given the opportunity to ask questions, which they did, and I answered them to the best of my ability today.  CC:  Aletha Halim., PA-C

## 2018-01-14 ENCOUNTER — Ambulatory Visit: Payer: Medicare Other | Admitting: Neurology

## 2018-01-14 ENCOUNTER — Encounter: Payer: Self-pay | Admitting: Neurology

## 2018-01-14 VITALS — BP 126/70 | HR 88 | Ht 65.0 in | Wt 179.0 lb

## 2018-01-14 DIAGNOSIS — G250 Essential tremor: Secondary | ICD-10-CM | POA: Diagnosis not present

## 2018-01-14 MED ORDER — PRIMIDONE 50 MG PO TABS: ORAL_TABLET | ORAL | 1 refills | Status: DC

## 2018-03-07 ENCOUNTER — Other Ambulatory Visit: Payer: Self-pay | Admitting: Family Medicine

## 2018-03-07 ENCOUNTER — Ambulatory Visit
Admission: RE | Admit: 2018-03-07 | Discharge: 2018-03-07 | Disposition: A | Discharge status: Home / Self Care | Payer: Medicare Other | Source: Ambulatory Visit | Attending: Family Medicine | Admitting: Family Medicine

## 2018-03-07 DIAGNOSIS — R0602 Shortness of breath: Secondary | ICD-10-CM

## 2018-05-30 ENCOUNTER — Other Ambulatory Visit: Payer: Self-pay | Admitting: Neurology

## 2018-06-18 ENCOUNTER — Other Ambulatory Visit: Payer: Self-pay | Admitting: Family Medicine

## 2018-06-18 ENCOUNTER — Ambulatory Visit
Admission: RE | Admit: 2018-06-18 | Discharge: 2018-06-18 | Disposition: A | Discharge status: Home / Self Care | Payer: Medicare Other | Source: Ambulatory Visit | Attending: Family Medicine | Admitting: Family Medicine

## 2018-06-18 DIAGNOSIS — S66912A Strain of unspecified muscle, fascia and tendon at wrist and hand level, left hand, initial encounter: Secondary | ICD-10-CM

## 2018-06-18 DIAGNOSIS — M25532 Pain in left wrist: Secondary | ICD-10-CM

## 2018-06-20 ENCOUNTER — Other Ambulatory Visit: Payer: Self-pay | Admitting: Family Medicine

## 2018-06-20 ENCOUNTER — Ambulatory Visit
Admission: RE | Admit: 2018-06-20 | Discharge: 2018-06-20 | Disposition: A | Discharge status: Home / Self Care | Payer: Medicare Other | Source: Ambulatory Visit | Attending: Family Medicine | Admitting: Family Medicine

## 2018-06-20 ENCOUNTER — Other Ambulatory Visit: Payer: Self-pay

## 2018-06-20 DIAGNOSIS — Z0189 Encounter for other specified special examinations: Secondary | ICD-10-CM

## 2018-07-04 ENCOUNTER — Encounter: Payer: Self-pay | Admitting: Neurology

## 2018-07-17 NOTE — Progress Notes (Signed)
Virtual Visit via Video Note The purpose of this virtual visit is to provide medical care while limiting exposure to the novel coronavirus.    Consent was obtained for video visit:  Yes.   Answered questions that patient had about telehealth interaction:  Yes.   I discussed the limitations, risks, security and privacy concerns of performing an evaluation and management service by telemedicine. I also discussed with the patient that there may be a patient responsible charge related to this service. The patient expressed understanding and agreed to proceed.  Pt location: Home Physician Location: home Name of referring provider:  Aletha Halim., PA-C I connected with Vallery Sa at patients initiation/request on 07/18/2018 at  1:30 PM EDT by video enabled telemedicine application and verified that I am speaking with the correct person using two identifiers. Pt MRN:  [id number] Pt DOB:  [birth year] Video Participants:  Vallery Sa;  son   History of Present Illness:  Patient seen today in follow-up for essential tremor.  Last visit, we increased her primidone, 50 mg, 3 tablets in the morning and 2 tablets at night.  Patient reports that "my shaking is better."  She has no side effects with the medication.  Medical records have been reviewed since last visit.  The patient was having wrist pain.  She had an x-ray that just demonstrated osteopenia of the wrist.  She is going to hand therapy.  States that she saw Dr. Amedeo Plenty   Current Outpatient Medications on File Prior to Visit  Medication Sig Dispense Refill  . ALPRAZolam (XANAX) 0.5 MG tablet Take 0.5 mg by mouth 2 (two) times daily as needed for anxiety.    . benazepril-hydrochlorthiazide (LOTENSIN HCT) 20-25 MG tablet TK 1 T PO D  0  . Calcium Carbonate-Vitamin D (CALCIUM 600+D PO) Take 1 tablet by mouth daily.     . carvedilol (COREG) 6.25 MG tablet Take 6.25 mg by mouth 2 (two) times daily with a meal.     . EPINEPHrine 0.3  mg/0.3 mL IJ SOAJ injection Inject 0.3 mLs (0.3 mg total) into the skin once as needed (for allergic reaction). 1 Device 2  . gabapentin (NEURONTIN) 300 MG capsule Take 300 mg by mouth 2 (two) times daily.  3  . Multiple Vitamins-Minerals (OCUVITE PO) Take 1 tablet by mouth daily.     Marland Kitchen omeprazole (PRILOSEC) 20 MG capsule Take 20 mg by mouth daily.    . primidone (MYSOLINE) 50 MG tablet 3 in AM, 2 in PM 450 tablet 0   No current facility-administered medications on file prior to visit.    Past Medical History:  Diagnosis Date  . Anxiety   . Arthritis    hands  . Carpal tunnel syndrome   . Colon polyps   . Diverticulosis   . GERD (gastroesophageal reflux disease)   . Hiatal hernia   . Hypertension   . Macular degeneration   . Peripheral neuropathy    Review of Systems  Constitutional: Negative.   HENT: Negative.   Genitourinary: Negative.   Musculoskeletal: Positive for joint pain (left hand).  Skin: Negative.      Observations/Objective:   Vitals:   07/18/18 0902  Weight: 175 lb (79.4 kg)  Height: 5\' 5"  (1.651 m)   GEN:  The patient appears stated age and is in NAD.  Neurological examination:  Orientation: The patient is alert and oriented x3. Cranial nerves: There is good facial symmetry. There is minimal facial hypomimia.  The speech  is fluent and clear. Soft palate rises symmetrically and there is no tongue deviation. Hearing is intact to conversational tone. Motor: Strength is at least antigravity x 4.   Shoulder shrug is equal and symmetric.  There is no pronator drift.  Movement examination: Tone: unable Abnormal movements: She has mild postural tremor.  There is mild intention tremor.  This is in the bilateral upper extremities. Coordination:  There is no decremation with RAM's, with hand opening and closing on the right.  She has pain with hand opening and closing on the left because of arthritis (currently seeing a hand surgeon).  She has no trouble with  finger taps bilaterally. Gait and Station: Not tested  Lab work is reviewed.  Last labs were done on Jun 17, 2018.  Sodium was 135, potassium 4.5, chloride 98, CO2 29, BUN 15 and creatinine 1.0.  Assessment and Plan:   1.  Essential tremor  -Continue primidone, 50 mg, 3 tablets in the morning and 2 tablets at night.  The increase last visit definitely helped.  She denies any side effects.  She will let me know when refills run out.  Follow Up Instructions:  6 months  -I discussed the assessment and treatment plan with the patient. The patient was provided an opportunity to ask questions and all were answered. The patient agreed with the plan and demonstrated an understanding of the instructions.   The patient was advised to call back or seek an in-person evaluation if the symptoms worsen or if the condition fails to improve as anticipated.    Total Time spent in visit with the patient was: 15 minutes.  Pt understands and agrees with the plan of care outlined.     Alonza Bogus, DO

## 2018-07-18 ENCOUNTER — Encounter: Payer: Self-pay | Admitting: Neurology

## 2018-07-18 ENCOUNTER — Telehealth (INDEPENDENT_AMBULATORY_CARE_PROVIDER_SITE_OTHER): Payer: Medicare Other | Admitting: Neurology

## 2018-07-18 ENCOUNTER — Other Ambulatory Visit: Payer: Self-pay

## 2018-07-18 DIAGNOSIS — G250 Essential tremor: Secondary | ICD-10-CM | POA: Diagnosis not present

## 2018-07-22 ENCOUNTER — Ambulatory Visit: Payer: Medicare Other | Admitting: Neurology

## 2018-09-04 DIAGNOSIS — N183 Chronic kidney disease, stage 3 unspecified: Secondary | ICD-10-CM | POA: Insufficient documentation

## 2018-10-07 ENCOUNTER — Encounter: Payer: Medicare Other | Admitting: Podiatry

## 2018-10-07 ENCOUNTER — Ambulatory Visit: Payer: Medicare Other

## 2018-10-07 DIAGNOSIS — M778 Other enthesopathies, not elsewhere classified: Secondary | ICD-10-CM

## 2018-10-07 NOTE — Progress Notes (Signed)
This encounter was created in error - please disregard.

## 2018-10-27 ENCOUNTER — Ambulatory Visit: Payer: Medicare Other | Admitting: Physician Assistant

## 2018-10-28 ENCOUNTER — Inpatient Hospital Stay (HOSPITAL_COMMUNITY)
Admission: EM | Admit: 2018-10-28 | Discharge: 2018-11-06 | DRG: 555 | Disposition: A | Discharge status: Skilled Nursing Facility | Payer: Medicare Other | Attending: Internal Medicine | Admitting: Internal Medicine

## 2018-10-28 ENCOUNTER — Emergency Department (HOSPITAL_COMMUNITY): Payer: Medicare Other

## 2018-10-28 ENCOUNTER — Other Ambulatory Visit: Payer: Self-pay

## 2018-10-28 ENCOUNTER — Encounter (HOSPITAL_COMMUNITY): Payer: Self-pay | Admitting: Emergency Medicine

## 2018-10-28 ENCOUNTER — Observation Stay (HOSPITAL_COMMUNITY): Payer: Medicare Other

## 2018-10-28 DIAGNOSIS — H3530 Unspecified macular degeneration: Secondary | ICD-10-CM | POA: Diagnosis present

## 2018-10-28 DIAGNOSIS — E871 Hypo-osmolality and hyponatremia: Secondary | ICD-10-CM | POA: Diagnosis present

## 2018-10-28 DIAGNOSIS — F419 Anxiety disorder, unspecified: Secondary | ICD-10-CM | POA: Diagnosis not present

## 2018-10-28 DIAGNOSIS — E876 Hypokalemia: Secondary | ICD-10-CM | POA: Diagnosis present

## 2018-10-28 DIAGNOSIS — R531 Weakness: Secondary | ICD-10-CM

## 2018-10-28 DIAGNOSIS — Z9049 Acquired absence of other specified parts of digestive tract: Secondary | ICD-10-CM

## 2018-10-28 DIAGNOSIS — R339 Retention of urine, unspecified: Secondary | ICD-10-CM | POA: Diagnosis not present

## 2018-10-28 DIAGNOSIS — E870 Hyperosmolality and hypernatremia: Secondary | ICD-10-CM | POA: Diagnosis present

## 2018-10-28 DIAGNOSIS — Z8739 Personal history of other diseases of the musculoskeletal system and connective tissue: Secondary | ICD-10-CM

## 2018-10-28 DIAGNOSIS — M109 Gout, unspecified: Secondary | ICD-10-CM | POA: Diagnosis present

## 2018-10-28 DIAGNOSIS — J9811 Atelectasis: Secondary | ICD-10-CM | POA: Diagnosis present

## 2018-10-28 DIAGNOSIS — Z23 Encounter for immunization: Secondary | ICD-10-CM

## 2018-10-28 DIAGNOSIS — M25572 Pain in left ankle and joints of left foot: Secondary | ICD-10-CM | POA: Diagnosis present

## 2018-10-28 DIAGNOSIS — R509 Fever, unspecified: Secondary | ICD-10-CM | POA: Diagnosis present

## 2018-10-28 DIAGNOSIS — K219 Gastro-esophageal reflux disease without esophagitis: Secondary | ICD-10-CM | POA: Diagnosis present

## 2018-10-28 DIAGNOSIS — S86091D Other specified injury of right Achilles tendon, subsequent encounter: Secondary | ICD-10-CM

## 2018-10-28 DIAGNOSIS — G9341 Metabolic encephalopathy: Secondary | ICD-10-CM | POA: Clinically undetermined

## 2018-10-28 DIAGNOSIS — I10 Essential (primary) hypertension: Secondary | ICD-10-CM | POA: Diagnosis present

## 2018-10-28 DIAGNOSIS — R000 Tachycardia, unspecified: Secondary | ICD-10-CM | POA: Diagnosis present

## 2018-10-28 DIAGNOSIS — R627 Adult failure to thrive: Secondary | ICD-10-CM | POA: Diagnosis present

## 2018-10-28 DIAGNOSIS — S93491D Sprain of other ligament of right ankle, subsequent encounter: Secondary | ICD-10-CM

## 2018-10-28 DIAGNOSIS — K449 Diaphragmatic hernia without obstruction or gangrene: Secondary | ICD-10-CM | POA: Diagnosis present

## 2018-10-28 DIAGNOSIS — E8809 Other disorders of plasma-protein metabolism, not elsewhere classified: Secondary | ICD-10-CM | POA: Diagnosis present

## 2018-10-28 DIAGNOSIS — R6510 Systemic inflammatory response syndrome (SIRS) of non-infectious origin without acute organ dysfunction: Secondary | ICD-10-CM

## 2018-10-28 DIAGNOSIS — G250 Essential tremor: Secondary | ICD-10-CM | POA: Diagnosis present

## 2018-10-28 DIAGNOSIS — E860 Dehydration: Secondary | ICD-10-CM | POA: Diagnosis not present

## 2018-10-28 DIAGNOSIS — Z8719 Personal history of other diseases of the digestive system: Secondary | ICD-10-CM

## 2018-10-28 DIAGNOSIS — R296 Repeated falls: Secondary | ICD-10-CM | POA: Diagnosis present

## 2018-10-28 DIAGNOSIS — G629 Polyneuropathy, unspecified: Secondary | ICD-10-CM | POA: Diagnosis present

## 2018-10-28 DIAGNOSIS — W19XXXA Unspecified fall, initial encounter: Secondary | ICD-10-CM | POA: Diagnosis present

## 2018-10-28 DIAGNOSIS — W1830XD Fall on same level, unspecified, subsequent encounter: Secondary | ICD-10-CM

## 2018-10-28 DIAGNOSIS — M25571 Pain in right ankle and joints of right foot: Principal | ICD-10-CM | POA: Diagnosis present

## 2018-10-28 DIAGNOSIS — Z90710 Acquired absence of both cervix and uterus: Secondary | ICD-10-CM

## 2018-10-28 DIAGNOSIS — W19XXXD Unspecified fall, subsequent encounter: Secondary | ICD-10-CM | POA: Diagnosis not present

## 2018-10-28 DIAGNOSIS — R197 Diarrhea, unspecified: Secondary | ICD-10-CM | POA: Diagnosis not present

## 2018-10-28 DIAGNOSIS — Z91018 Allergy to other foods: Secondary | ICD-10-CM

## 2018-10-28 DIAGNOSIS — I129 Hypertensive chronic kidney disease with stage 1 through stage 4 chronic kidney disease, or unspecified chronic kidney disease: Secondary | ICD-10-CM | POA: Diagnosis present

## 2018-10-28 DIAGNOSIS — Z8249 Family history of ischemic heart disease and other diseases of the circulatory system: Secondary | ICD-10-CM

## 2018-10-28 DIAGNOSIS — Z886 Allergy status to analgesic agent status: Secondary | ICD-10-CM

## 2018-10-28 DIAGNOSIS — K5900 Constipation, unspecified: Secondary | ICD-10-CM | POA: Diagnosis present

## 2018-10-28 DIAGNOSIS — Z885 Allergy status to narcotic agent status: Secondary | ICD-10-CM

## 2018-10-28 DIAGNOSIS — Z20828 Contact with and (suspected) exposure to other viral communicable diseases: Secondary | ICD-10-CM | POA: Diagnosis present

## 2018-10-28 DIAGNOSIS — Z79899 Other long term (current) drug therapy: Secondary | ICD-10-CM

## 2018-10-28 DIAGNOSIS — S82891A Other fracture of right lower leg, initial encounter for closed fracture: Secondary | ICD-10-CM | POA: Diagnosis present

## 2018-10-28 DIAGNOSIS — N183 Chronic kidney disease, stage 3 unspecified: Secondary | ICD-10-CM | POA: Diagnosis present

## 2018-10-28 LAB — CBC WITH DIFFERENTIAL/PLATELET
Abs Immature Granulocytes: 0.31 10*3/uL — ABNORMAL HIGH (ref 0.00–0.07)
Basophils Absolute: 0.1 10*3/uL (ref 0.0–0.1)
Basophils Relative: 1 %
Eosinophils Absolute: 0 10*3/uL (ref 0.0–0.5)
Eosinophils Relative: 0 %
HCT: 29.5 % — ABNORMAL LOW (ref 36.0–46.0)
Hemoglobin: 10.4 g/dL — ABNORMAL LOW (ref 12.0–15.0)
Immature Granulocytes: 3 %
Lymphocytes Relative: 12 %
Lymphs Abs: 1.3 10*3/uL (ref 0.7–4.0)
MCH: 33.3 pg (ref 26.0–34.0)
MCHC: 35.3 g/dL (ref 30.0–36.0)
MCV: 94.6 fL (ref 80.0–100.0)
Monocytes Absolute: 0.9 10*3/uL (ref 0.1–1.0)
Monocytes Relative: 8 %
Neutro Abs: 8.2 10*3/uL — ABNORMAL HIGH (ref 1.7–7.7)
Neutrophils Relative %: 76 %
Platelets: 288 10*3/uL (ref 150–400)
RBC: 3.12 MIL/uL — ABNORMAL LOW (ref 3.87–5.11)
RDW: 13.2 % (ref 11.5–15.5)
WBC: 10.7 10*3/uL — ABNORMAL HIGH (ref 4.0–10.5)
nRBC: 0 % (ref 0.0–0.2)

## 2018-10-28 LAB — COMPREHENSIVE METABOLIC PANEL
ALT: 53 U/L — ABNORMAL HIGH (ref 0–44)
AST: 41 U/L (ref 15–41)
Albumin: 2.6 g/dL — ABNORMAL LOW (ref 3.5–5.0)
Alkaline Phosphatase: 74 U/L (ref 38–126)
Anion gap: 14 (ref 5–15)
BUN: 19 mg/dL (ref 8–23)
CO2: 23 mmol/L (ref 22–32)
Calcium: 9.1 mg/dL (ref 8.9–10.3)
Chloride: 92 mmol/L — ABNORMAL LOW (ref 98–111)
Creatinine, Ser: 1.06 mg/dL — ABNORMAL HIGH (ref 0.44–1.00)
GFR calc Af Amer: 57 mL/min — ABNORMAL LOW (ref >60)
GFR calc non Af Amer: 49 mL/min — ABNORMAL LOW (ref >60)
Glucose, Bld: 116 mg/dL — ABNORMAL HIGH (ref 70–99)
Potassium: 3.5 mmol/L (ref 3.5–5.1)
Sodium: 129 mmol/L — ABNORMAL LOW (ref 135–145)
Total Bilirubin: 0.7 mg/dL (ref 0.3–1.2)
Total Protein: 6.1 g/dL — ABNORMAL LOW (ref 6.5–8.1)

## 2018-10-28 LAB — URINALYSIS, ROUTINE W REFLEX MICROSCOPIC
Bilirubin Urine: NEGATIVE
Glucose, UA: NEGATIVE mg/dL
Hgb urine dipstick: NEGATIVE
Ketones, ur: NEGATIVE mg/dL
Leukocytes,Ua: NEGATIVE
Nitrite: NEGATIVE
Protein, ur: NEGATIVE mg/dL
Specific Gravity, Urine: 1.021 (ref 1.005–1.030)
pH: 5 (ref 5.0–8.0)

## 2018-10-28 LAB — TSH: TSH: 1.124 u[IU]/mL (ref 0.350–4.500)

## 2018-10-28 LAB — LACTIC ACID, PLASMA
Lactic Acid, Venous: 0.8 mmol/L (ref 0.5–1.9)
Lactic Acid, Venous: 0.7 mmol/L (ref 0.5–1.9)

## 2018-10-28 LAB — SARS CORONAVIRUS 2 BY RT PCR (HOSPITAL ORDER, PERFORMED IN ~~LOC~~ HOSPITAL LAB): SARS Coronavirus 2: NEGATIVE

## 2018-10-28 LAB — AMMONIA: Ammonia: 21 umol/L (ref 9–35)

## 2018-10-28 MED ORDER — PRIMIDONE 50 MG PO TABS
150.0000 mg | ORAL_TABLET | Freq: Every morning | ORAL | Status: DC
  Administered 2018-10-29 – 2018-11-06 (×9): 150 mg via ORAL
  Filled 2018-10-28 (×9): qty 3

## 2018-10-28 MED ORDER — PRIMIDONE 50 MG PO TABS
100.0000 mg | ORAL_TABLET | Freq: Every day | ORAL | Status: DC
  Administered 2018-10-28 – 2018-11-05 (×9): 100 mg via ORAL
  Filled 2018-10-28 (×10): qty 2

## 2018-10-28 MED ORDER — ONDANSETRON HCL 4 MG PO TABS
4.0000 mg | ORAL_TABLET | Freq: Four times a day (QID) | ORAL | Status: DC | PRN
  Administered 2018-11-02: 4 mg via ORAL
  Filled 2018-10-28: qty 1

## 2018-10-28 MED ORDER — SODIUM CHLORIDE 0.9% FLUSH
3.0000 mL | Freq: Two times a day (BID) | INTRAVENOUS | Status: DC
  Administered 2018-10-28 – 2018-11-05 (×15): 3 mL via INTRAVENOUS

## 2018-10-28 MED ORDER — ALLOPURINOL 100 MG PO TABS
100.0000 mg | ORAL_TABLET | Freq: Two times a day (BID) | ORAL | Status: DC
  Administered 2018-10-28 – 2018-11-06 (×19): 100 mg via ORAL
  Filled 2018-10-28 (×20): qty 1

## 2018-10-28 MED ORDER — PIPERACILLIN-TAZOBACTAM 3.375 G IVPB
3.3750 g | Freq: Three times a day (TID) | INTRAVENOUS | Status: DC
  Administered 2018-10-28: 3.375 g via INTRAVENOUS
  Filled 2018-10-28: qty 50

## 2018-10-28 MED ORDER — SODIUM CHLORIDE 0.9 % IV BOLUS
500.0000 mL | Freq: Once | INTRAVENOUS | Status: AC
  Administered 2018-10-28: 500 mL via INTRAVENOUS

## 2018-10-28 MED ORDER — GABAPENTIN 300 MG PO CAPS
300.0000 mg | ORAL_CAPSULE | Freq: Two times a day (BID) | ORAL | Status: DC
  Administered 2018-10-28 – 2018-11-06 (×19): 300 mg via ORAL
  Filled 2018-10-28 (×19): qty 1

## 2018-10-28 MED ORDER — ACETAMINOPHEN 325 MG PO TABS
650.0000 mg | ORAL_TABLET | Freq: Four times a day (QID) | ORAL | Status: DC | PRN
  Administered 2018-10-28 – 2018-11-01 (×8): 650 mg via ORAL
  Filled 2018-10-28 (×8): qty 2

## 2018-10-28 MED ORDER — PIPERACILLIN-TAZOBACTAM 3.375 G IVPB 30 MIN: 3.3750 g | Freq: Three times a day (TID) | INTRAVENOUS | Status: DC

## 2018-10-28 MED ORDER — ALBUTEROL SULFATE (2.5 MG/3ML) 0.083% IN NEBU: 2.5000 mg | INHALATION_SOLUTION | Freq: Four times a day (QID) | RESPIRATORY_TRACT | Status: DC | PRN

## 2018-10-28 MED ORDER — SODIUM CHLORIDE 0.9 % IV SOLN
Freq: Once | INTRAVENOUS | Status: AC
  Administered 2018-10-28: 125 mL/h via INTRAVENOUS

## 2018-10-28 MED ORDER — TRAMADOL HCL 50 MG PO TABS
50.0000 mg | ORAL_TABLET | Freq: Four times a day (QID) | ORAL | Status: DC | PRN
  Administered 2018-10-28 – 2018-11-06 (×6): 50 mg via ORAL
  Filled 2018-10-28 (×7): qty 1

## 2018-10-28 MED ORDER — ALPRAZOLAM 0.5 MG PO TABS
0.5000 mg | ORAL_TABLET | Freq: Two times a day (BID) | ORAL | Status: DC | PRN
  Administered 2018-10-28 – 2018-11-03 (×4): 0.5 mg via ORAL
  Filled 2018-10-28 (×4): qty 1

## 2018-10-28 MED ORDER — ONDANSETRON HCL 4 MG/2ML IJ SOLN: 4.0000 mg | Freq: Four times a day (QID) | INTRAMUSCULAR | Status: DC | PRN

## 2018-10-28 MED ORDER — PANTOPRAZOLE SODIUM 40 MG PO TBEC
40.0000 mg | DELAYED_RELEASE_TABLET | Freq: Every day | ORAL | Status: DC
  Administered 2018-10-28 – 2018-11-06 (×10): 40 mg via ORAL
  Filled 2018-10-28 (×10): qty 1

## 2018-10-28 MED ORDER — SODIUM CHLORIDE 0.9 % IV SOLN
Freq: Once | INTRAVENOUS | Status: AC
  Administered 2018-10-28: 75 mL/h via INTRAVENOUS

## 2018-10-28 MED ORDER — ENSURE ENLIVE PO LIQD
237.0000 mL | Freq: Two times a day (BID) | ORAL | Status: DC
  Administered 2018-10-29 – 2018-11-06 (×17): 237 mL via ORAL
  Filled 2018-10-28: qty 237

## 2018-10-28 MED ORDER — PRIMIDONE 50 MG PO TABS: 100.0000 mg | ORAL_TABLET | Freq: Two times a day (BID) | ORAL | Status: DC

## 2018-10-28 MED ORDER — ACETAMINOPHEN 650 MG RE SUPP: 650.0000 mg | Freq: Four times a day (QID) | RECTAL | Status: DC | PRN

## 2018-10-28 MED ORDER — CARVEDILOL 6.25 MG PO TABS
6.2500 mg | ORAL_TABLET | Freq: Two times a day (BID) | ORAL | Status: DC
  Administered 2018-10-28 – 2018-11-06 (×18): 6.25 mg via ORAL
  Filled 2018-10-28 (×18): qty 1

## 2018-10-28 MED ORDER — SODIUM CHLORIDE 0.9 % IV SOLN
Freq: Once | INTRAVENOUS | Status: AC
  Administered 2018-10-28: 50 mL/h via INTRAVENOUS

## 2018-10-28 NOTE — ED Notes (Signed)
Son at bedside feeding patient lunch.  No difficulty eating.

## 2018-10-28 NOTE — H&P (Addendum)
History and Physical    Caitlin Chavez [id number] DOB: 1934/02/05 DOA: 10/28/2018  Referring MD/NP/PA: Jennette Kettle, MD PCP: Aletha Halim., PA-C  Patient coming from: Home Via EMS  Chief Complaint: Weakness  I have personally briefly reviewed patient's old medical records in [place]   HPI: Caitlin Chavez is a 84 y.o. female with medical history significant of hypertension, hyponatremia, diverticulosis, GERD, hiatal hernia, and arthritis; who presents with complaints of progressively worsening weakness.  History is mostly obtained from the patient's son who is present at bedside.  Patient apparently fell last month sustaining closed fracture to the right ankle and injury to the left ankle and right knee sprain.  Patient has been in a boot on the right foot and brace on the left.  She has been living at home with caregivers to help assist her.  However, over the last week or so has been getting progressively weaker.  No longer able to participate in physical therapy.  She had been discontinued off of opiate pain medication due to increased risks of falls.  She reportedly had been taking Tylenol and meloxicam.  Associated symptoms include sleeping more, no appetite, nausea, intermittently confused, planing of lower abdominal pain, abdominal distention, increased abdominal sounds, diarrhea, and inability to urinate.  Her clothes have been more loose fitting although she has not checked her weight lately.  Denies having any vomiting, cough, chest pain, or changes in her medicines.  He has shortness of breath with exertion at baseline, but it is unchanged.  En route with EMS patient was noted to be febrile up to 100.8 F and given 1 g of Tylenol.   ED Course: Upon admission into the emergency department patient was noted to have a temperature of 99.7 F, pulse 92-118, respiration 13-30, blood pressure as low as 89/69 with improvement with IV fluids currently 128/71, and O2 saturation  maintained on room air.  Labs significant for WBC 10.7, hemoglobin 10.4, sodium 129, chloride 92, potassium 3.5, BUN 19, creatinine 1.06, albumin 2.6, ALT 53, lactic acid within normal limits.  Urinalysis did not show any significant signs of infection.  Chest x-ray noted moderate retrocardiac hiatal hernia.  Patient was given 500 mL of normal saline IV fluids.  Review of Systems  Constitutional: Positive for fever and malaise/fatigue.  HENT: Negative for ear discharge and nosebleeds.   Eyes: Negative for double vision and pain.  Respiratory: Positive for shortness of breath. Negative for cough.   Cardiovascular: Positive for leg swelling. Negative for chest pain.  Gastrointestinal: Positive for abdominal pain, diarrhea and nausea. Negative for blood in stool and vomiting.  Genitourinary: Positive for urgency. Negative for dysuria, flank pain and hematuria.       Decreased urine output.  Musculoskeletal: Positive for falls and joint pain.  Skin: Negative for rash.  Neurological: Positive for weakness. Negative for focal weakness and loss of consciousness.  Psychiatric/Behavioral: Negative for substance abuse.       Positive for confusion    Past Medical History:  Diagnosis Date  . Anxiety   . Arthritis    hands  . Carpal tunnel syndrome   . Colon polyps   . Diverticulosis   . GERD (gastroesophageal reflux disease)   . Hiatal hernia   . Hypertension   . Macular degeneration   . Peripheral neuropathy     Past Surgical History:  Procedure Laterality Date  . CATARACT EXTRACTION, BILATERAL    . CHOLECYSTECTOMY    . COLONOSCOPY    .  LASIK    . POLYPECTOMY    . VAGINAL HYSTERECTOMY       reports that she has never smoked. She has never used smokeless tobacco. She reports that she does not drink alcohol or use drugs.  Allergies  Allergen Reactions  . Alka-Seltzer [Aspirin Effervescent] Anaphylaxis and Hives  . Beef-Derived Products Anaphylaxis  . Nsaids Anaphylaxis and Hives   . Other Anaphylaxis and Rash  . Pork-Derived Products Anaphylaxis  . Codeine Hives and Nausea And Vomiting    Family History  Problem Relation Age of Onset  . Heart attack Father 109  . Alcohol abuse Father   . Heart failure Mother 69       chf  . Heart disease Mother   . Stomach cancer Maternal Grandmother   . Prostate cancer Son   . Colon cancer Neg Hx     Prior to Admission medications   Medication Sig Start Date End Date Taking? Authorizing Provider  allopurinol (ZYLOPRIM) 100 MG tablet Take 100 mg by mouth 2 (two) times daily.   Yes [provider]  ALPRAZolam Duanne Moron) 0.5 MG tablet Take 0.5 mg by mouth 2 (two) times daily as needed for anxiety.   Yes [provider]  benazepril-hydrochlorthiazide (LOTENSIN HCT) 20-25 MG tablet Take 1 tablet by mouth daily.  06/27/17  Yes [provider]  Calcium Carbonate-Vitamin D (CALCIUM 600+D PO) Take 1 tablet by mouth daily.    Yes [provider]  carvedilol (COREG) 6.25 MG tablet Take 6.25 mg by mouth 2 (two) times daily with a meal.  07/11/15  Yes [provider]  EPINEPHrine 0.3 mg/0.3 mL IJ SOAJ injection Inject 0.3 mLs (0.3 mg total) into the skin once as needed (for allergic reaction). 10/22/16  Yes Upstill, Nehemiah Settle, PA-C  gabapentin (NEURONTIN) 300 MG capsule Take 300 mg by mouth 2 (two) times daily. 07/11/15  Yes [provider]  meloxicam (MOBIC) 15 MG tablet Take 7.5 mg by mouth daily.   Yes [provider]  Multiple Vitamins-Minerals (OCUVITE PO) Take 1 tablet by mouth daily.    Yes [provider]  omeprazole (PRILOSEC) 20 MG capsule Take 20 mg by mouth daily.   Yes [provider]  primidone (MYSOLINE) 50 MG tablet 3 in AM, 2 in PM Patient taking differently: Take 100-150 mg by mouth 2 (two) times daily. 3 in AM, 2 in PM 05/30/18  Yes Tat, Eustace Quail, DO  vitamin B-12 (CYANOCOBALAMIN) 100 MCG tablet Take 100 mcg by mouth daily.   Yes [provider]    Physical Exam:  Constitutional: Elderly female who appears to be in no acute distress at this time. Vitals:   10/28/18 0915 10/28/18 0930 10/28/18 0945 10/28/18 1000  BP: 136/69 129/74 136/70 128/71  Pulse: 96 97 92 93  Resp: (!) 21 19 (!) 21 19  Temp:      TempSrc:      SpO2: 97% 96% 97% 96%  Weight:      Height:       Eyes: PERRL, lids and conjunctivae normal ENMT: Mucous membranes are dry. Posterior pharynx clear of any exudate or lesions.   Neck: normal, supple, no masses, no thyromegaly Respiratory: clear to auscultation bilaterally, no wheezing, no crackles. Normal respiratory effort. No accessory muscle use.  Cardiovascular: Regular rate and rhythm, no murmurs / rubs / gallops.  Trace lower extremity edema. 2+ pedal pulses. No carotid bruits.  Abdomen: Mild distention. No tenderness, no masses palpated. No hepatosplenomegaly. Bowel sounds  positive.  Musculoskeletal: no clubbing / cyanosis. No joint deformity upper and lower extremities. Good ROM, no contractures. Normal muscle tone.  Skin: no rashes, lesions, ulcers. No induration Neurologic: CN 2-12 grossly intact. Sensation intact, DTR normal. Strength 5/5 in all 4.  Psychiatric: Normal judgment and insight. Alert and oriented x 3. Normal mood.     Labs on Admission: I have personally reviewed following labs and imaging studies  CBC: Recent Labs  Lab 10/28/18 0248  WBC 10.7*  NEUTROABS 8.2*  HGB 10.4*  HCT 29.5*  MCV 94.6  PLT 123XX123   Basic Metabolic Panel: Recent Labs  Lab 10/28/18 0248  NA 129*  K 3.5  CL 92*  CO2 23  GLUCOSE 116*  BUN 19  CREATININE 1.06*  CALCIUM 9.1   GFR: Estimated Creatinine Clearance: 42 mL/min (A) (by C-G formula based on SCr of 1.06 mg/dL (H)). Liver Function Tests: Recent Labs  Lab 10/28/18 0248  AST 41  ALT 53*  ALKPHOS 74  BILITOT 0.7  PROT 6.1*  ALBUMIN 2.6*   No results for input(s): LIPASE, AMYLASE in the last 168 hours. No results for input(s):  AMMONIA in the last 168 hours. Coagulation Profile: No results for input(s): INR, PROTIME in the last 168 hours. Cardiac Enzymes: No results for input(s): CKTOTAL, CKMB, CKMBINDEX, TROPONINI in the last 168 hours. BNP (last 3 results) No results for input(s): PROBNP in the last 8760 hours. HbA1C: No results for input(s): HGBA1C in the last 72 hours. CBG: No results for input(s): GLUCAP in the last 168 hours. Lipid Profile: No results for input(s): CHOL, HDL, LDLCALC, TRIG, CHOLHDL, LDLDIRECT in the last 72 hours. Thyroid Function Tests: No results for input(s): TSH, T4TOTAL, FREET4, T3FREE, THYROIDAB in the last 72 hours. Anemia Panel: No results for input(s): VITAMINB12, FOLATE, FERRITIN, TIBC, IRON, RETICCTPCT in the last 72 hours. Urine analysis:    Component Value Date/Time   COLORURINE YELLOW 10/28/2018 0451   APPEARANCEUR CLEAR 10/28/2018 0451   LABSPEC 1.021 10/28/2018 0451   PHURINE 5.0 10/28/2018 0451   GLUCOSEU NEGATIVE 10/28/2018 0451   HGBUR NEGATIVE 10/28/2018 0451   BILIRUBINUR NEGATIVE 10/28/2018 0451   KETONESUR NEGATIVE 10/28/2018 0451   PROTEINUR NEGATIVE 10/28/2018 0451   NITRITE NEGATIVE 10/28/2018 0451   LEUKOCYTESUR NEGATIVE 10/28/2018 0451   Sepsis Labs: Recent Results (from the past 240 hour(s))  SARS Coronavirus 2 [facility] order, Performed in [facility] hospital lab) Nasopharyngeal Nasopharyngeal Swab     Status: None   Collection Time: 10/28/18  2:48 AM   Specimen: Nasopharyngeal Swab  Result Value Ref Range Status   SARS Coronavirus 2 NEGATIVE NEGATIVE Final    Comment: (NOTE) If result is NEGATIVE SARS-CoV-2 target nucleic acids are NOT DETECTED. The SARS-CoV-2 RNA is generally detectable in upper and lower  respiratory specimens during the acute phase of infection. The lowest  concentration of SARS-CoV-2 viral copies this assay can detect is 250  copies / mL. A negative result does not preclude SARS-CoV-2 infection  and should not be used  as the sole basis for treatment or other  patient management decisions.  A negative result may occur with  improper specimen collection / handling, submission of specimen other  than nasopharyngeal swab, presence of viral mutation(s) within the  areas targeted by this assay, and inadequate number of viral copies  (<250 copies / mL). A negative result must be combined with clinical  observations, patient history, and epidemiological information. If result is POSITIVE SARS-CoV-2 target nucleic acids are DETECTED.  The SARS-CoV-2 RNA is generally detectable in upper and lower  respiratory specimens dur ing the acute phase of infection.  Positive  results are indicative of active infection with SARS-CoV-2.  Clinical  correlation with patient history and other diagnostic information is  necessary to determine patient infection status.  Positive results do  not rule out bacterial infection or co-infection with other viruses. If result is PRESUMPTIVE POSTIVE SARS-CoV-2 nucleic acids MAY BE PRESENT.   A presumptive positive result was obtained on the submitted specimen  and confirmed on repeat testing.  While 2019 novel coronavirus  (SARS-CoV-2) nucleic acids may be present in the submitted sample  additional confirmatory testing may be necessary for epidemiological  and / or clinical management purposes  to differentiate between  SARS-CoV-2 and other Sarbecovirus currently known to infect humans.  If clinically indicated additional testing with an alternate test  methodology [phone]) is advised. The SARS-CoV-2 RNA is generally  detectable in upper and lower respiratory sp ecimens during the acute  phase of infection. The expected result is Negative. Fact Sheet for Patients:  StrictlyIdeas.no Fact Sheet for Healthcare Providers: BankingDealers.co.za This test is not yet approved or cleared by the [place] FDA and has been authorized for  detection and/or diagnosis of SARS-CoV-2 by FDA under an Emergency Use Authorization (EUA).  This EUA will remain in effect (meaning this test can be used) for the duration of the COVID-19 declaration under Section 564(b)(1) of the Act, 21 U.S.C. section 360bbb-3(b)(1), unless the authorization is terminated or revoked sooner. Performed at [facility], [place] [address] [place], [place] [address]      Radiological Exams on Admission: Dg Chest Portable 1 View  Result Date: 10/28/2018 CLINICAL DATA:  Fever. Generalized weakness. Fall 2 weeks ago. EXAM: PORTABLE CHEST 1 VIEW COMPARISON:  Radiograph 07/11/2015. FINDINGS: The cardiomediastinal contours are unchanged. Moderate retrocardiac hiatal hernia with adjacent atelectasis at the left lung base. Pulmonary vasculature is normal. No consolidation, pleural effusion, or pneumothorax. No acute osseous abnormalities are seen. IMPRESSION: 1. No acute abnormality. 2. Moderate retrocardiac hiatal hernia with adjacent atelectasis at the left lung base. Electronically Signed   By: Keith Rake M.D.   On: 10/28/2018 03:20    EKG: Independently reviewed.  Sinus tachycardia 117 bpm with low voltage.  Assessment/Plan SIRS: Acute.  Patient noted to have fever up to 100.7 F prior to arrival with tachycardia and tachypnea.  WBC elevated up to 10.7 with lactic acid reassuring at 0.8.  Chest x-ray reveals hiatal hernia with adjacent atelectasis at the left lung base.  COVID-19 screening negative.  Urinalysis negative for any acute signs of infection. Due to the patient's complaints of abdominal pain question possibility of diverticulitis.  On the differential includes gastroenteritis/gastritis related to meloxicam vs. partial bowel obstruction vs. atelectasis/infiltrate. -Admit to a medical telemetry bed -Follow-up blood cultures  -Clear liquid diet and advance -Check CT scan of the abdomen and pelvis without contrast -Will empirically start Zosyn IV,  but we will discontinue if no signs of infection on CT  Hyponatremia: Acute on chronic.  Sodium 129 on admission.  Patient with decreased oral intake and/or diuretic use as likely cause. -Gentle IV fluids -Continue to monitor sodium level  Generalized weakness with falls, right ankle fracture: Patient reportedly fractured her right ankle and this last month along with injuries to her left ankle and right knee. -Continue braces with ambulation -Tramadol as needed for pain -PT/OT to eval and treat   -Social work consult for  possible need placement  Acute metabolic encephalopathy -Neurochecks -Check TSH and ammonia level  Essential hypertension: On admission blood pressures noted to be as low as 89/69 with maps maintained. -Continue Coreg as tolerated -Hold benazepril-hydrochlorothiazide due to initially low normal blood pressures.  Hypoalbuminemia: Acute.  Patient noted to have decreased overall p.o. intake.  On admission Albumin 2.6. -Check prealbumin in a.m. -Ensure shakes with meals  Hiatal hernia, GERD: Patient noted to have a moderately large hiatal hernia.  on chest x-ray -Continue pharmacy substitution for omeprazole -Suspect this could be the cause of her indigestion and decreased appetite may warrant surgical evaluation for Nissen fundoplication   Essential tremor: Patient chronically on primidone with no recent changes in dose. -Continue primidone   Elevated ALT.  Acute.  Patient noted to have a mild elevation in ALT to 53.  Unclear cause of symptoms. -Continue to monitor  Anxiety -Continue Xanax as needed anxiety  CKD stage III: Creatinine appears relatively stable at 1.06 when compared to prior baseline which appears to range around 0.9-1. -Continue to monitor    DVT prophylaxis: SCDs Code Status: Full Family Communication: Discussed plan of care with the patient family present at bedside Disposition Plan: To be determined Consults called: none Admission  status: Observation  Norval Morton MD [facility] Pager [phone]   If 7PM-7AM, please contact night-coverage [email or web address] Password [email or web address]  10/28/2018, 10:06 AM

## 2018-10-28 NOTE — ED Triage Notes (Signed)
Pt BIB [facility] from home, states that she fell x 2 weeks ago and is wearing bilateral ankle braces. C/o generalized weakness, decreased appetite and nausea since. Denies vomiting/diarrhea. EMS temp. 100.8, given 1g tylenol PTA. Denies known COVID exposure, but states she does have frequent doctor visits. EMS VS: BP 116/62, HR 120, SpO2 95% room air, CBG 110.

## 2018-10-28 NOTE — ED Notes (Signed)
Attempted report 

## 2018-10-28 NOTE — ED Notes (Signed)
ED TO INPATIENT HANDOFF REPORT  ED Nurse Name and Phone #: [phone]  S Name/Age/Gender Caitlin Chavez 84 y.o. female Room/Bed: 056C/056C  Code Status   Code Status: Full Code  Home/SNF/Other Home Patient oriented to: self, place, time and situation Is this baseline? Yes   Triage Complete: Triage complete  Chief Complaint General Weakness; Fever  Triage Note Pt BIB [facility] from home, states that she fell x 2 weeks ago and is wearing bilateral ankle braces. C/o generalized weakness, decreased appetite and nausea since. Denies vomiting/diarrhea. EMS temp. 100.8, given 1g tylenol PTA. Denies known COVID exposure, but states she does have frequent doctor visits. EMS VS: BP 116/62, HR 120, SpO2 95% room air, CBG 110.   Allergies Allergies  Allergen Reactions  . Alka-Seltzer [Aspirin Effervescent] Anaphylaxis and Hives  . Beef-Derived Products Anaphylaxis  . Nsaids Anaphylaxis and Hives  . Other Anaphylaxis and Rash  . Pork-Derived Products Anaphylaxis  . Codeine Hives and Nausea And Vomiting    Level of Care/Admitting Diagnosis ED Disposition    ED Disposition Condition Comment   Admit  Hospital Area: [facility] [[id number]]  Level of Care: Telemetry Medical [104]  I expect the patient will be discharged within 24 hours: No (not a candidate for 5C-Observation unit)  Covid Evaluation: Asymptomatic Screening Protocol (No Symptoms)  Diagnosis: Falls [id number]  Admitting Physician: Norval Morton [id number]  Attending Physician: Norval Morton [id number]  PT Class (Do Not Modify): Observation [104]  PT Acc Code (Do Not Modify): Observation [10022]       B Medical/Surgery History Past Medical History:  Diagnosis Date  . Anxiety   . Arthritis    hands  . Carpal tunnel syndrome   . Colon polyps   . Diverticulosis   . GERD (gastroesophageal reflux disease)   . Hiatal hernia   . Hypertension   . Macular degeneration   . Peripheral neuropathy     Past Surgical History:  Procedure Laterality Date  . CATARACT EXTRACTION, BILATERAL    . CHOLECYSTECTOMY    . COLONOSCOPY    . LASIK    . POLYPECTOMY    . VAGINAL HYSTERECTOMY       A IV Location/Drains/Wounds Patient Lines/Drains/Airways Status   Active Line/Drains/Airways    Name:   Placement date:   Placement time:   Site:   Days:   Peripheral IV 10/28/18 Right Antecubital   10/28/18    0224    Antecubital   less than 1          Intake/Output Last 24 hours  Intake/Output Summary (Last 24 hours) at 10/28/2018 1943 Last data filed at 10/28/2018 1200 Gross per 24 hour  Intake 500 ml  Output 550 ml  Net -50 ml    Labs/Imaging Results for orders placed or performed during the hospital encounter of 10/28/18 (from the past 48 hour(s))  CBC with Differential     Status: Abnormal   Collection Time: 10/28/18  2:48 AM  Result Value Ref Range   WBC 10.7 (H) 4.0 - 10.5 K/uL   RBC 3.12 (L) 3.87 - 5.11 MIL/uL   Hemoglobin 10.4 (L) 12.0 - 15.0 g/dL   HCT 29.5 (L) 36.0 - 46.0 %   MCV 94.6 80.0 - 100.0 fL   MCH 33.3 26.0 - 34.0 pg   MCHC 35.3 30.0 - 36.0 g/dL   RDW 13.2 11.5 - 15.5 %   Platelets 288 150 - 400 K/uL   nRBC 0.0 0.0 - 0.2 %  Neutrophils Relative % 76 %   Neutro Abs 8.2 (H) 1.7 - 7.7 K/uL   Lymphocytes Relative 12 %   Lymphs Abs 1.3 0.7 - 4.0 K/uL   Monocytes Relative 8 %   Monocytes Absolute 0.9 0.1 - 1.0 K/uL   Eosinophils Relative 0 %   Eosinophils Absolute 0.0 0.0 - 0.5 K/uL   Basophils Relative 1 %   Basophils Absolute 0.1 0.0 - 0.1 K/uL   Immature Granulocytes 3 %   Abs Immature Granulocytes 0.31 (H) 0.00 - 0.07 K/uL    Comment: Performed at [facility] [address], [place], [place] [address]  Lactic acid, plasma     Status: None   Collection Time: 10/28/18  2:48 AM  Result Value Ref Range   Lactic Acid, Venous 0.8 0.5 - 1.9 mmol/L    Comment: Performed at [facility] [address], [place], [place] [address]  Comprehensive  metabolic panel     Status: Abnormal   Collection Time: 10/28/18  2:48 AM  Result Value Ref Range   Sodium 129 (L) 135 - 145 mmol/L   Potassium 3.5 3.5 - 5.1 mmol/L   Chloride 92 (L) 98 - 111 mmol/L   CO2 23 22 - 32 mmol/L   Glucose, Bld 116 (H) 70 - 99 mg/dL   BUN 19 8 - 23 mg/dL   Creatinine, Ser 1.06 (H) 0.44 - 1.00 mg/dL   Calcium 9.1 8.9 - 10.3 mg/dL   Total Protein 6.1 (L) 6.5 - 8.1 g/dL   Albumin 2.6 (L) 3.5 - 5.0 g/dL   AST 41 15 - 41 U/L   ALT 53 (H) 0 - 44 U/L   Alkaline Phosphatase 74 38 - 126 U/L   Total Bilirubin 0.7 0.3 - 1.2 mg/dL   GFR calc non Af Amer 49 (L) >60 mL/min   GFR calc Af Amer 57 (L) >60 mL/min   Anion gap 14 5 - 15    Comment: Performed at [facility], [place] [address], [place], [place] [address]  SARS Coronavirus 2 [facility] order, Performed in [facility] hospital lab) Nasopharyngeal Nasopharyngeal Swab     Status: None   Collection Time: 10/28/18  2:48 AM   Specimen: Nasopharyngeal Swab  Result Value Ref Range   SARS Coronavirus 2 NEGATIVE NEGATIVE    Comment: (NOTE) If result is NEGATIVE SARS-CoV-2 target nucleic acids are NOT DETECTED. The SARS-CoV-2 RNA is generally detectable in upper and lower  respiratory specimens during the acute phase of infection. The lowest  concentration of SARS-CoV-2 viral copies this assay can detect is 250  copies / mL. A negative result does not preclude SARS-CoV-2 infection  and should not be used as the sole basis for treatment or other  patient management decisions.  A negative result may occur with  improper specimen collection / handling, submission of specimen other  than nasopharyngeal swab, presence of viral mutation(s) within the  areas targeted by this assay, and inadequate number of viral copies  (<250 copies / mL). A negative result must be combined with clinical  observations, patient history, and epidemiological information. If result is POSITIVE SARS-CoV-2 target nucleic acids are  DETECTED. The SARS-CoV-2 RNA is generally detectable in upper and lower  respiratory specimens dur ing the acute phase of infection.  Positive  results are indicative of active infection with SARS-CoV-2.  Clinical  correlation with patient history and other diagnostic information is  necessary to determine patient infection status.  Positive  results do  not rule out bacterial infection or co-infection with other viruses. If result is PRESUMPTIVE POSTIVE SARS-CoV-2 nucleic acids MAY BE PRESENT.   A presumptive positive result was obtained on the submitted specimen  and confirmed on repeat testing.  While 2019 novel coronavirus  (SARS-CoV-2) nucleic acids may be present in the submitted sample  additional confirmatory testing may be necessary for epidemiological  and / or clinical management purposes  to differentiate between  SARS-CoV-2 and other Sarbecovirus currently known to infect humans.  If clinically indicated additional testing with an alternate test  methodology [phone]) is advised. The SARS-CoV-2 RNA is generally  detectable in upper and lower respiratory sp ecimens during the acute  phase of infection. The expected result is Negative. Fact Sheet for Patients:  StrictlyIdeas.no Fact Sheet for Healthcare Providers: BankingDealers.co.za This test is not yet approved or cleared by the [place] FDA and has been authorized for detection and/or diagnosis of SARS-CoV-2 by FDA under an Emergency Use Authorization (EUA).  This EUA will remain in effect (meaning this test can be used) for the duration of the COVID-19 declaration under Section 564(b)(1) of the Act, 21 U.S.C. section 360bbb-3(b)(1), unless the authorization is terminated or revoked sooner. Performed at [facility], [place] [address] [place], [place] [address]   Lactic acid, plasma     Status: None   Collection Time: 10/28/18  3:46 AM  Result Value Ref Range    Lactic Acid, Venous 0.7 0.5 - 1.9 mmol/L    Comment: Performed at [facility] [address], [place], [place] [address]  Urinalysis, Routine w reflex microscopic     Status: None   Collection Time: 10/28/18  4:51 AM  Result Value Ref Range   Color, Urine YELLOW YELLOW   APPearance CLEAR CLEAR   Specific Gravity, Urine 1.021 1.005 - 1.030   pH 5.0 5.0 - 8.0   Glucose, UA NEGATIVE NEGATIVE mg/dL   Hgb urine dipstick NEGATIVE NEGATIVE   Bilirubin Urine NEGATIVE NEGATIVE   Ketones, ur NEGATIVE NEGATIVE mg/dL   Protein, ur NEGATIVE NEGATIVE mg/dL   Nitrite NEGATIVE NEGATIVE   Leukocytes,Ua NEGATIVE NEGATIVE    Comment: Performed at [facility] [address] [place], [place] [address]   Ct Abdomen Pelvis Wo Contrast  Result Date: 10/28/2018 CLINICAL DATA:  Generalized weakness after fall 2 weeks ago. EXAM: CT ABDOMEN AND PELVIS WITHOUT CONTRAST TECHNIQUE: Multidetector CT imaging of the abdomen and pelvis was performed following the standard protocol without IV contrast. COMPARISON:  CT scan of November 21, 2015. FINDINGS: Lower chest: Large sliding-type hiatal hernia is noted. Visualized lung bases are unremarkable. Hepatobiliary: No focal liver abnormality is seen. Status post cholecystectomy. No biliary dilatation. Pancreas: Unremarkable. No pancreatic ductal dilatation or surrounding inflammatory changes. Spleen: Normal in size without focal abnormality. Adrenals/Urinary Tract: Adrenal glands are unremarkable. Kidneys are normal, without renal calculi, focal lesion, or hydronephrosis. Bladder is unremarkable. Stomach/Bowel: Stomach is within normal limits. Appendix appears normal. No evidence of bowel wall thickening, distention, or inflammatory changes. Vascular/Lymphatic: Aortic atherosclerosis. No enlarged abdominal or pelvic lymph nodes. Reproductive: Status post hysterectomy. No adnexal masses. Other: Small fat containing periumbilical hernia is noted. No ascites is noted.  Musculoskeletal: No acute or significant osseous findings. IMPRESSION: Large sliding-type hiatal hernia. Small fat containing periumbilical hernia. No acute abnormality seen in the abdomen or pelvis. Aortic Atherosclerosis (ICD10-I70.0). Electronically Signed   By: Marijo Conception M.D.   On: 10/28/2018 19:03  Dg Chest Portable 1 View  Result Date: 10/28/2018 CLINICAL DATA:  Fever. Generalized weakness. Fall 2 weeks ago. EXAM: PORTABLE CHEST 1 VIEW COMPARISON:  Radiograph 07/11/2015. FINDINGS: The cardiomediastinal contours are unchanged. Moderate retrocardiac hiatal hernia with adjacent atelectasis at the left lung base. Pulmonary vasculature is normal. No consolidation, pleural effusion, or pneumothorax. No acute osseous abnormalities are seen. IMPRESSION: 1. No acute abnormality. 2. Moderate retrocardiac hiatal hernia with adjacent atelectasis at the left lung base. Electronically Signed   By: Keith Rake M.D.   On: 10/28/2018 03:20    Pending Labs Unresulted Labs (From admission, onward)    Start     Ordered   10/29/18 0500  CBC  Tomorrow morning,   R     10/28/18 1024   10/29/18 [id number]  Basic metabolic panel  Tomorrow morning,   R     10/28/18 1024   10/29/18 0500  Prealbumin  Tomorrow morning,   R     10/28/18 1533   10/28/18 1523  Ammonia  Add-on,   AD     10/28/18 1522   10/28/18 1521  TSH  Add-on,   AD     10/28/18 1520   10/28/18 0239  Culture, blood (routine x 2)  BLOOD CULTURE X 2,   STAT     10/28/18 0238   10/28/18 0238  Urine culture  ONCE - STAT,   STAT     10/28/18 0238          Vitals/Pain Today's Vitals   10/28/18 1900 10/28/18 1915 10/28/18 1930 10/28/18 1943  BP: (!) 158/89 (!) 166/83 (!) 157/85   Pulse: (!) 104 (!) 101 (!) 103   Resp: 19 (!) 21 17   Temp:      TempSrc:      SpO2: 97% 97% 99%   Weight:      Height:      PainSc:    0-No pain    Isolation Precautions No active isolations  Medications Medications  ALPRAZolam (XANAX) tablet 0.5 mg  (has no administration in time range)  carvedilol (COREG) tablet 6.25 mg (has no administration in time range)  allopurinol (ZYLOPRIM) tablet 100 mg (100 mg Oral Given 10/28/18 1727)  gabapentin (NEURONTIN) capsule 300 mg (300 mg Oral Given 10/28/18 1208)  pantoprazole (PROTONIX) EC tablet 40 mg (40 mg Oral Given 10/28/18 1208)  sodium chloride flush (NS) 0.9 % injection 3 mL (3 mLs Intravenous Given 10/28/18 1210)  acetaminophen (TYLENOL) tablet 650 mg (650 mg Oral Given 10/28/18 1208)    Or  acetaminophen (TYLENOL) suppository 650 mg ( Rectal See Alternative 10/28/18 1208)  ondansetron (ZOFRAN) tablet 4 mg (has no administration in time range)    Or  ondansetron (ZOFRAN) injection 4 mg (has no administration in time range)  albuterol (PROVENTIL) (2.5 MG/3ML) 0.083% nebulizer solution 2.5 mg (has no administration in time range)  feeding supplement (ENSURE ENLIVE) (ENSURE ENLIVE) liquid 237 mL (has no administration in time range)  primidone (MYSOLINE) tablet 100-150 mg (has no administration in time range)  traMADol (ULTRAM) tablet 50 mg (has no administration in time range)  0.9 %  sodium chloride infusion (has no administration in time range)  sodium chloride 0.9 % bolus 500 mL (0 mLs Intravenous Stopped 10/28/18 0515)  0.9 %  sodium chloride infusion ( Intravenous Stopped 10/28/18 1648)  0.9 %  sodium chloride infusion ( Intravenous Stopped 10/28/18 1937)    Mobility walks with device High fall risk   Focused Assessments Neuro Assessment Handoff:  Swallow screen pass? Yes  Cardiac Rhythm: Normal sinus rhythm       Neuro Assessment: Exceptions to WDL Neuro Checks:      Last Documented NIHSS Modified Score:   Has TPA been given? No If patient is a Neuro Trauma and patient is going to OR before floor call report to [place] nurse: [phone] or [phone]     R Recommendations: See Admitting Provider Note  Report given to:   Additional Notes:  Pt eating and tolerating  well

## 2018-10-28 NOTE — ED Provider Notes (Signed)
[facility] EMERGENCY DEPARTMENT Provider Note   CSN: [id number] Arrival date & time: 10/28/18  [id number]     History   Chief Complaint Chief Complaint  Patient presents with  . Weakness  . Fever    HPI Caitlin Chavez is a 84 y.o. female.     Patient brought to the ED with her son who provides history. The patient lives alone with multiple family members nearby who assist with in-home care. She has had an increasing number of falls recently that resulted in bilateral ankle and right knee injury 3 weeks ago. Since that time, her generalized weakness and number of falls has progressively increased. In the last 2 days, the son reports increased confusion, fever, weakness to the point where she can no longer assist with weight bearing. "Her legs just won't hold her up", per son. The pain only complains of pain in the lower extremities, however, the son reports she complained of abdominal pain earlier today. She has had diarrhea described as nonbloody, multiple BMs daily, improved yesterday with Imodium but returned today. No nausea or vomiting. Her son reports she is not eating or drinking.   The history is provided by the patient and a relative. No language interpreter was used.  Weakness Associated symptoms: abdominal pain, diarrhea, fever and shortness of breath (Chronic)   Associated symptoms: no chest pain and no cough   Fever Associated symptoms: confusion and diarrhea   Associated symptoms: no chest pain, no chills, no congestion and no cough     Past Medical History:  Diagnosis Date  . Anxiety   . Arthritis    hands  . Carpal tunnel syndrome   . Colon polyps   . Diverticulosis   . GERD (gastroesophageal reflux disease)   . Hiatal hernia   . Hypertension   . Macular degeneration   . Peripheral neuropathy     Patient Active Problem List   Diagnosis Date Noted  . Essential tremor 07/09/2017    Past Surgical History:  Procedure Laterality Date  .  CATARACT EXTRACTION, BILATERAL    . CHOLECYSTECTOMY    . COLONOSCOPY    . LASIK    . POLYPECTOMY    . VAGINAL HYSTERECTOMY       OB History   No obstetric history on file.      Home Medications    Prior to Admission medications   Medication Sig Start Date End Date Taking? Authorizing Provider  ALPRAZolam Duanne Moron) 0.5 MG tablet Take 0.5 mg by mouth 2 (two) times daily as needed for anxiety.    [provider]  benazepril-hydrochlorthiazide (LOTENSIN HCT) 20-25 MG tablet TK 1 T PO D 06/27/17   [provider]  Calcium Carbonate-Vitamin D (CALCIUM 600+D PO) Take 1 tablet by mouth daily.     [provider]  carvedilol (COREG) 6.25 MG tablet Take 6.25 mg by mouth 2 (two) times daily with a meal.  07/11/15   [provider]  EPINEPHrine 0.3 mg/0.3 mL IJ SOAJ injection Inject 0.3 mLs (0.3 mg total) into the skin once as needed (for allergic reaction). 10/22/16   Charlann Lange, PA-C  gabapentin (NEURONTIN) 300 MG capsule Take 300 mg by mouth 2 (two) times daily. 07/11/15   [provider]  Multiple Vitamins-Minerals (OCUVITE PO) Take 1 tablet by mouth daily.     [provider]  omeprazole (PRILOSEC) 20 MG capsule Take 20 mg by mouth daily.    [provider]  primidone (MYSOLINE) 50  MG tablet 3 in AM, 2 in PM 05/30/18   Tat, Eustace Quail, DO    Family History Family History  Problem Relation Age of Onset  . Heart attack Father 23  . Alcohol abuse Father   . Heart failure Mother 68       chf  . Heart disease Mother   . Stomach cancer Maternal Grandmother   . Prostate cancer Son   . Colon cancer Neg Hx     Social History Social History   Tobacco Use  . Smoking status: Never Smoker  . Smokeless tobacco: Never Used  Substance Use Topics  . Alcohol use: No  . Drug use: No     Allergies   Alka-seltzer [aspirin effervescent], Nsaids, Other, Codeine, Beef-derived products, and Pork-derived products   Review of  Systems Review of Systems  Constitutional: Positive for activity change, appetite change and fever. Negative for chills.  HENT: Negative.  Negative for congestion and trouble swallowing.   Respiratory: Positive for shortness of breath (Chronic). Negative for cough.   Cardiovascular: Negative.  Negative for chest pain.  Gastrointestinal: Positive for abdominal pain and diarrhea. Negative for rectal pain.  Genitourinary: Positive for decreased urine volume.  Musculoskeletal:       See HPI.  Skin: Negative.  Negative for color change and wound.  Neurological: Positive for weakness.  Psychiatric/Behavioral: Positive for confusion.     Physical Exam Updated Vital Signs BP 97/61   Pulse (!) 114   Temp 99.7 F (37.6 C) (Rectal)   Resp 18   Ht 5\' 5"  (1.651 m)   Wt 77.1 kg   SpO2 95%   BMI 28.29 kg/m   Physical Exam Vitals signs and nursing note reviewed.  Constitutional:      General: She is not in acute distress.    Appearance: She is well-developed.  HENT:     Head: Normocephalic.  Eyes:     Pupils: Pupils are equal, round, and reactive to light.  Neck:     Musculoskeletal: Normal range of motion and neck supple.  Cardiovascular:     Rate and Rhythm: Regular rhythm. Tachycardia present.     Heart sounds: No murmur.  Pulmonary:     Effort: Pulmonary effort is normal.     Breath sounds: Normal breath sounds. No wheezing, rhonchi or rales.  Abdominal:     General: Bowel sounds are normal.     Palpations: Abdomen is soft.     Tenderness: There is no abdominal tenderness. There is no guarding or rebound.  Musculoskeletal: Normal range of motion.     Comments: CAM walker to right LE, ASO to left ankle. Minimal swelling distally on the left.   Skin:    General: Skin is warm and dry.     Findings: No rash.  Neurological:     General: No focal deficit present.     Mental Status: She is alert and oriented to person, place, and time.     Sensory: No sensory deficit.      Comments: Patient is A&O x3 but admits to forgetting details provided by son.      ED Treatments / Results  Labs (all labs ordered are listed, but only abnormal results are displayed) Labs Reviewed  CBC WITH DIFFERENTIAL/PLATELET - Abnormal; Notable for the following components:      Result Value   WBC 10.7 (*)    RBC 3.12 (*)    Hemoglobin 10.4 (*)    HCT 29.5 (*)  Neutro Abs 8.2 (*)    Abs Immature Granulocytes 0.31 (*)    All other components within normal limits  COMPREHENSIVE METABOLIC PANEL - Abnormal; Notable for the following components:   Sodium 129 (*)    Chloride 92 (*)    Glucose, Bld 116 (*)    Creatinine, Ser 1.06 (*)    Total Protein 6.1 (*)    Albumin 2.6 (*)    ALT 53 (*)    GFR calc non Af Amer 49 (*)    GFR calc Af Amer 57 (*)    All other components within normal limits  URINE CULTURE  CULTURE, BLOOD (ROUTINE X 2)  CULTURE, BLOOD (ROUTINE X 2)  SARS CORONAVIRUS 2 (HOSPITAL ORDER, [facility])  LACTIC ACID, PLASMA  LACTIC ACID, PLASMA  URINALYSIS, ROUTINE W REFLEX MICROSCOPIC    EKG EKG Interpretation  Date/Time:  Tuesday October 28 2018 02:24:29 EDT Ventricular Rate:  117 PR Interval:    QRS Duration: 72 QT Interval:  311 QTC Calculation: 434 R Axis:   55 Text Interpretation:  Sinus tachycardia Low voltage, extremity and precordial leads Confirmed by Orpah Greek [phone]) on 10/28/2018 2:45:26 AM   Radiology Dg Chest Portable 1 View  Result Date: 10/28/2018 CLINICAL DATA:  Fever. Generalized weakness. Fall 2 weeks ago. EXAM: PORTABLE CHEST 1 VIEW COMPARISON:  Radiograph 07/11/2015. FINDINGS: The cardiomediastinal contours are unchanged. Moderate retrocardiac hiatal hernia with adjacent atelectasis at the left lung base. Pulmonary vasculature is normal. No consolidation, pleural effusion, or pneumothorax. No acute osseous abnormalities are seen. IMPRESSION: 1. No acute abnormality. 2. Moderate retrocardiac  hiatal hernia with adjacent atelectasis at the left lung base. Electronically Signed   By: Keith Rake M.D.   On: 10/28/2018 03:20    Procedures Procedures (including critical care time)  Medications Ordered in ED Medications  sodium chloride 0.9 % bolus 500 mL (500 mLs Intravenous New Bag/Given 10/28/18 0313)     Initial Impression / Assessment and Plan / ED Course  I have reviewed the triage vital signs and the nursing notes.  Pertinent labs & imaging results that were available during my care of the patient were reviewed by me and considered in my medical decision making (see chart for details).        Patient to ED with son who reports progressive weakness over the last 3 weeks, multiple falls resulting in injury, not eating or drinking. Today with fever and profound weakness, unable to stand, and confusion.   She arrives tachycardic, hypotensive at 96/60. Fever per EMS who provided Tylenol prior to arrival. Rectal temp here 99.7.  The patient complains of pain in her LE's where injured, otherwise, denies pain. She is confused as to certain events but oriented to person, place, time.  Ambulation not attempted in ED. Blood pressure improves with small bolus fluids. Urine obtained by catheterization - per son she has not urinated in 12 hours.   Elderly lady who lives alone, arrives with profound weakness, tachycardic, hypotensive, likely dehydrated for no PO intake and recent diarrhea. She will need to be admitted for improvement in condition and strength, possibly to consider discharge to rehab facility.   Final Clinical Impressions(s) / ED Diagnoses   Final diagnoses:  None   1. Weakness 2. Dehydration 3. Fever  ED Discharge Orders    None       Charlann Lange, PA-C 10/28/18 0541    Orpah Greek, MD 10/28/18 [phone]

## 2018-10-28 NOTE — Progress Notes (Signed)
Consult request has been received. CSW attempting to follow up at present time  Velencia Lenart M. Zaviyar Rahal LCSWA Transitions of Care  Clinical Social Worker  Ph: [phone] 

## 2018-10-28 NOTE — ED Notes (Signed)
Pt to ct 

## 2018-10-28 NOTE — ED Notes (Signed)
Lunch Tray Ordered @ 1104.  

## 2018-10-28 NOTE — TOC Initial Note (Signed)
Transition of Care [facility]) - Initial/Assessment Note    Patient Details  Name: Caitlin Chavez MRN: [id number] Date of Birth: [birth year]  Transition of Care [facility]) CM/SW Contact:    [place], LCSW Phone Number: 10/28/2018, 7:51 PM  Clinical Narrative:  CSW at bedside, accompanied by pts son Caitlin Chavez [facility]: [phone]), to conduct TOC assessment. CSW consulted by EDP for possible placement.   Pt is a 84 yo female who resides at home alone but is currently receiving 24 hour supervision from family in a home setting. Caitlin Chavez explains that prior to being admitted, pt suffered from 2 falls which resulted in injuries including a fractured foot. Caitlin Chavez explains that pt suffered 2 falls: one 6 weeks ago and the other 3 weeks ago. Caitlin Chavez goes into detail and explains that after the fall, pts appettite and ability to care for herself has decreased. Caitlin Chavez shares that he noticed pt was not eating and had increased weakness.           Prior to falls, pt was able to ambulate independently, drove to medical appointment and was able to complete ADLs independently. Pt uses a walker and raised toilet seat in the home.   Family and Pt explain that their goal is for pt to return to her baseline prior to the falls. Pt has no hx of home health services or inpatient rehab. Pt did receive some outpatient level rehab.   CSW awaiting recommendation from PT evaluation. Family and pt agreeable to short term rehab from SNF if indicated by PT eval. CSW will submit screen for PASRR. TOC team will continue to follow pt for any social work related needs.   Expected Discharge Plan: Skilled Nursing Facility Barriers to Discharge: Continued Medical Work up   Patient Goals and CMS Choice Patient states their goals for this hospitalization and ongoing recovery are:: family and patient express that the goal is for patient to return to her baseline level of functioning before the fall. CMS Medicare.gov Compare Post Acute  Care list provided to:: Patient Choice offered to / list presented to : Patient  Expected Discharge Plan and Services Expected Discharge Plan: [place] In-house Referral: Clinical Social Work   Post Acute Care Choice: Skilled Nursing Facility(if recommended by PT) Living arrangements for the past 2 months: [place]                                      Prior Living Arrangements/Services Living arrangements for the past 2 months: Single Family Home Lives with:: Self Patient language and need for interpreter reviewed:: Yes Do you feel safe going back to the place where you live?: Yes      Need for Family Participation in Patient Care: No (Comment)(Pt alert and oriented. Has family involvment if needed) Care giver support system in place?: Yes (comment)(Family currently providing 24 hr supervision for patient in home setting) Current home services: DME, Other (comment), Sitter(24 hour supervision provided by family) Criminal Activity/Legal Involvement Pertinent to Current Situation/Hospitalization: No - Comment as needed  Activities of Daily Living      Permission Sought/Granted Permission sought to share information with : Case Manager, Customer service manager Permission granted to share information with : Yes, Verbal Permission Granted  Share Information with NAME: Caitlin Chavez  Permission granted to share info w AGENCY: n/a  Permission granted to share info w Relationship: Son  Permission granted to  share info w Contact Information: PH: [phone]  Emotional Assessment Appearance:: Appears stated age Attitude/Demeanor/Rapport: Engaged Affect (typically observed): Calm, Hopeful, Pleasant Orientation: : Oriented to Self, Oriented to Place, Oriented to  Time, Oriented to Situation Alcohol / Substance Use: Not Applicable Psych Involvement: No (comment)  Admission diagnosis:  General Weakness; Fever Patient Active Problem List    Diagnosis Date Noted  . Falls 10/28/2018  . Hiatal hernia 10/28/2018  . Hyponatremia 10/28/2018  . Generalized weakness 10/28/2018  . Essential hypertension 10/28/2018  . Hypoalbuminemia 10/28/2018  . Anxiety 10/28/2018  . Essential tremor 07/09/2017   PCP:  Aletha Halim., PA-C Pharmacy:   [facility] [phone] - [place], [place] - [address] [place] [address] [place] [address] Phone: [phone] Fax: [phone]     Social Determinants of Health (SDOH) Interventions    Readmission Risk Interventions No flowsheet data found.

## 2018-10-29 ENCOUNTER — Encounter (HOSPITAL_COMMUNITY): Payer: Self-pay | Admitting: Internal Medicine

## 2018-10-29 DIAGNOSIS — S82891A Other fracture of right lower leg, initial encounter for closed fracture: Secondary | ICD-10-CM | POA: Diagnosis not present

## 2018-10-29 DIAGNOSIS — M79609 Pain in unspecified limb: Secondary | ICD-10-CM | POA: Diagnosis not present

## 2018-10-29 DIAGNOSIS — M25571 Pain in right ankle and joints of right foot: Secondary | ICD-10-CM | POA: Diagnosis present

## 2018-10-29 DIAGNOSIS — Z20828 Contact with and (suspected) exposure to other viral communicable diseases: Secondary | ICD-10-CM | POA: Diagnosis present

## 2018-10-29 DIAGNOSIS — I129 Hypertensive chronic kidney disease with stage 1 through stage 4 chronic kidney disease, or unspecified chronic kidney disease: Secondary | ICD-10-CM | POA: Diagnosis present

## 2018-10-29 DIAGNOSIS — R509 Fever, unspecified: Secondary | ICD-10-CM | POA: Diagnosis present

## 2018-10-29 DIAGNOSIS — K449 Diaphragmatic hernia without obstruction or gangrene: Secondary | ICD-10-CM | POA: Diagnosis present

## 2018-10-29 DIAGNOSIS — Z8739 Personal history of other diseases of the musculoskeletal system and connective tissue: Secondary | ICD-10-CM | POA: Diagnosis not present

## 2018-10-29 DIAGNOSIS — M109 Gout, unspecified: Secondary | ICD-10-CM | POA: Diagnosis not present

## 2018-10-29 DIAGNOSIS — A689 Relapsing fever, unspecified: Secondary | ICD-10-CM | POA: Diagnosis not present

## 2018-10-29 DIAGNOSIS — W1830XD Fall on same level, unspecified, subsequent encounter: Secondary | ICD-10-CM | POA: Diagnosis not present

## 2018-10-29 DIAGNOSIS — R000 Tachycardia, unspecified: Secondary | ICD-10-CM | POA: Diagnosis present

## 2018-10-29 DIAGNOSIS — F419 Anxiety disorder, unspecified: Secondary | ICD-10-CM | POA: Diagnosis present

## 2018-10-29 DIAGNOSIS — I10 Essential (primary) hypertension: Secondary | ICD-10-CM | POA: Diagnosis not present

## 2018-10-29 DIAGNOSIS — G9341 Metabolic encephalopathy: Secondary | ICD-10-CM | POA: Diagnosis not present

## 2018-10-29 DIAGNOSIS — G629 Polyneuropathy, unspecified: Secondary | ICD-10-CM | POA: Diagnosis present

## 2018-10-29 DIAGNOSIS — R627 Adult failure to thrive: Secondary | ICD-10-CM | POA: Diagnosis present

## 2018-10-29 DIAGNOSIS — M25572 Pain in left ankle and joints of left foot: Secondary | ICD-10-CM | POA: Diagnosis present

## 2018-10-29 DIAGNOSIS — E870 Hyperosmolality and hypernatremia: Secondary | ICD-10-CM | POA: Diagnosis present

## 2018-10-29 DIAGNOSIS — H3530 Unspecified macular degeneration: Secondary | ICD-10-CM | POA: Diagnosis present

## 2018-10-29 DIAGNOSIS — E8809 Other disorders of plasma-protein metabolism, not elsewhere classified: Secondary | ICD-10-CM | POA: Diagnosis present

## 2018-10-29 DIAGNOSIS — E860 Dehydration: Secondary | ICD-10-CM | POA: Diagnosis present

## 2018-10-29 DIAGNOSIS — S82891D Other fracture of right lower leg, subsequent encounter for closed fracture with routine healing: Secondary | ICD-10-CM | POA: Diagnosis not present

## 2018-10-29 DIAGNOSIS — R531 Weakness: Secondary | ICD-10-CM | POA: Diagnosis not present

## 2018-10-29 DIAGNOSIS — G250 Essential tremor: Secondary | ICD-10-CM | POA: Diagnosis present

## 2018-10-29 DIAGNOSIS — R296 Repeated falls: Secondary | ICD-10-CM | POA: Diagnosis present

## 2018-10-29 DIAGNOSIS — N183 Chronic kidney disease, stage 3 unspecified: Secondary | ICD-10-CM | POA: Diagnosis present

## 2018-10-29 DIAGNOSIS — X58XXXA Exposure to other specified factors, initial encounter: Secondary | ICD-10-CM | POA: Diagnosis not present

## 2018-10-29 DIAGNOSIS — K5900 Constipation, unspecified: Secondary | ICD-10-CM | POA: Diagnosis present

## 2018-10-29 DIAGNOSIS — S93491D Sprain of other ligament of right ankle, subsequent encounter: Secondary | ICD-10-CM | POA: Diagnosis not present

## 2018-10-29 DIAGNOSIS — W19XXXD Unspecified fall, subsequent encounter: Secondary | ICD-10-CM | POA: Diagnosis not present

## 2018-10-29 DIAGNOSIS — K219 Gastro-esophageal reflux disease without esophagitis: Secondary | ICD-10-CM | POA: Diagnosis present

## 2018-10-29 DIAGNOSIS — J9811 Atelectasis: Secondary | ICD-10-CM | POA: Diagnosis present

## 2018-10-29 DIAGNOSIS — Z23 Encounter for immunization: Secondary | ICD-10-CM | POA: Diagnosis not present

## 2018-10-29 DIAGNOSIS — I361 Nonrheumatic tricuspid (valve) insufficiency: Secondary | ICD-10-CM | POA: Diagnosis not present

## 2018-10-29 DIAGNOSIS — S86091D Other specified injury of right Achilles tendon, subsequent encounter: Secondary | ICD-10-CM | POA: Diagnosis not present

## 2018-10-29 DIAGNOSIS — R410 Disorientation, unspecified: Secondary | ICD-10-CM | POA: Diagnosis not present

## 2018-10-29 LAB — BASIC METABOLIC PANEL
Anion gap: 10 (ref 5–15)
Anion gap: 15 (ref 5–15)
BUN: 16 mg/dL (ref 8–23)
BUN: 17 mg/dL (ref 8–23)
CO2: 21 mmol/L — ABNORMAL LOW (ref 22–32)
CO2: 20 mmol/L — ABNORMAL LOW (ref 22–32)
Calcium: 8.3 mg/dL — ABNORMAL LOW (ref 8.9–10.3)
Calcium: 8.4 mg/dL — ABNORMAL LOW (ref 8.9–10.3)
Chloride: 96 mmol/L — ABNORMAL LOW (ref 98–111)
Chloride: 96 mmol/L — ABNORMAL LOW (ref 98–111)
Creatinine, Ser: 0.85 mg/dL (ref 0.44–1.00)
Creatinine, Ser: 0.84 mg/dL (ref 0.44–1.00)
GFR calc Af Amer: >60 mL/min (ref >60)
GFR calc Af Amer: >60 mL/min (ref >60)
GFR calc non Af Amer: >60 mL/min (ref >60)
GFR calc non Af Amer: >60 mL/min (ref >60)
Glucose, Bld: 113 mg/dL — ABNORMAL HIGH (ref 70–99)
Glucose, Bld: 100 mg/dL — ABNORMAL HIGH (ref 70–99)
Potassium: 3.6 mmol/L (ref 3.5–5.1)
Potassium: 3.8 mmol/L (ref 3.5–5.1)
Sodium: 127 mmol/L — ABNORMAL LOW (ref 135–145)
Sodium: 131 mmol/L — ABNORMAL LOW (ref 135–145)

## 2018-10-29 LAB — CK: Total CK: 42 U/L (ref 38–234)

## 2018-10-29 LAB — CBC
HCT: 28.1 % — ABNORMAL LOW (ref 36.0–46.0)
Hemoglobin: 10 g/dL — ABNORMAL LOW (ref 12.0–15.0)
MCH: 33.2 pg (ref 26.0–34.0)
MCHC: 35.6 g/dL (ref 30.0–36.0)
MCV: 93.4 fL (ref 80.0–100.0)
Platelets: 260 10*3/uL (ref 150–400)
RBC: 3.01 MIL/uL — ABNORMAL LOW (ref 3.87–5.11)
RDW: 13.1 % (ref 11.5–15.5)
WBC: 9.9 10*3/uL (ref 4.0–10.5)
nRBC: 0 % (ref 0.0–0.2)

## 2018-10-29 LAB — URINE CULTURE: Culture: NO GROWTH

## 2018-10-29 LAB — OSMOLALITY, URINE: Osmolality, Ur: 804 mOsm/kg (ref 300–900)

## 2018-10-29 LAB — PREALBUMIN: Prealbumin: 12.9 mg/dL — ABNORMAL LOW (ref 18–38)

## 2018-10-29 LAB — OSMOLALITY: Osmolality: 270 mOsm/kg — ABNORMAL LOW (ref 275–295)

## 2018-10-29 LAB — SODIUM, URINE, RANDOM: Sodium, Ur: 78 mmol/L

## 2018-10-29 MED ORDER — INFLUENZA VAC A&B SA ADJ QUAD 0.5 ML IM PRSY
0.5000 mL | PREFILLED_SYRINGE | INTRAMUSCULAR | Status: DC
  Filled 2018-10-29: qty 0.5

## 2018-10-29 MED ORDER — BISACODYL 10 MG RE SUPP: 10.0000 mg | Freq: Once | RECTAL | Status: DC

## 2018-10-29 MED ORDER — SODIUM CHLORIDE 0.9 % IV SOLN
INTRAVENOUS | Status: AC
  Administered 2018-10-29 (×2): via INTRAVENOUS

## 2018-10-29 MED ORDER — SODIUM CHLORIDE 0.9 % IV BOLUS
500.0000 mL | Freq: Once | INTRAVENOUS | Status: AC
  Administered 2018-10-29: 500 mL via INTRAVENOUS

## 2018-10-29 NOTE — Progress Notes (Signed)
OT Cancellation Note  Patient Details Name: Caitlin Chavez MRN: [id number] DOB: May 27, 1934   Cancelled Treatment:    Reason Eval/Treat Not Completed: Fatigue/lethargy limiting ability to participate   Pt just back to bed and declined OT eval at this time.  OT will check on pt next day.    Kari Baars, OT Acute Rehabilitation Services [phone] Office- [phone], Edwena Felty D 10/29/2018, 2:32 PM

## 2018-10-29 NOTE — Progress Notes (Signed)
Dr. Wynelle Cleveland notified of patien't temperature.  Awaitng orders.

## 2018-10-29 NOTE — NC FL2 (Signed)
[place] MEDICAID FL2 LEVEL OF CARE SCREENING TOOL     IDENTIFICATION  Patient Name: Caitlin Chavez Birthdate: 1934/07/08 Sex: female Admission Date (Current Location): 10/28/2018  [facility] and [place] Number:  Herbalist and Address:  The . [facility], [place] [address], [place], [place] [address]      Provider Number: [id number]  Attending Physician Name and Address:  Debbe Odea, MD  Relative Name and Phone Number:  Alandra Bara [phone]    Current Level of Care: Hospital Recommended Level of Care: [place] Prior Approval Number:    Date Approved/Denied:   PASRR Number: [id number]  Discharge Plan: SNF    Current Diagnoses: Patient Active Problem List   Diagnosis Date Noted  . Failure to thrive in adult 10/29/2018  . Ankle fracture, right 10/29/2018  . Falls 10/28/2018  . Hiatal hernia 10/28/2018  . Hyponatremia 10/28/2018  . Generalized weakness 10/28/2018  . Essential hypertension 10/28/2018  . Hypoalbuminemia 10/28/2018  . Anxiety 10/28/2018  . Essential tremor 07/09/2017    Orientation RESPIRATION BLADDER Height & Weight     Self, Time, Situation, Place  Normal Continent Weight: 77.1 kg Height:  5\' 5"  (165.1 cm)  BEHAVIORAL SYMPTOMS/MOOD NEUROLOGICAL BOWEL NUTRITION STATUS  (N/A) (N/A) Continent Diet  AMBULATORY STATUS COMMUNICATION OF NEEDS Skin   Extensive Assist Verbally Other (Comment)(RLE edema)                       Personal Care Assistance Level of Assistance  Bathing, Feeding, Dressing Bathing Assistance: Maximum assistance Feeding assistance: Limited assistance Dressing Assistance: Maximum assistance     Functional Limitations Info  Sight, Hearing, Speech Sight Info: Adequate Hearing Info: Adequate Speech Info: Adequate    SPECIAL CARE FACTORS FREQUENCY  PT (By licensed PT), OT (By licensed OT)     PT Frequency: 3x/week OT Frequency: 3x/week            Contractures  Contractures Info: Not present    Additional Factors Info  Code Status, Allergies Code Status Info: Full Code Allergies Info: Alka-Seltzer; Beef-derived Products; NSAIDS; Pork-derived Products-derived Products           Current Medications (10/29/2018):  This is the current hospital active medication list Current Facility-Administered Medications  Medication Dose Route Frequency Provider Last Rate Last Dose  . 0.9 %  sodium chloride infusion   Intravenous Continuous Radene Gunning, NP 75 mL/hr at 10/29/18 0859    . acetaminophen (TYLENOL) tablet 650 mg  650 mg Oral Q6H PRN Fuller Plan A, MD   650 mg at 10/28/18 1208   Or  . acetaminophen (TYLENOL) suppository 650 mg  650 mg Rectal Q6H PRN Fuller Plan A, MD      . albuterol (PROVENTIL) (2.5 MG/3ML) 0.083% nebulizer solution 2.5 mg  2.5 mg Nebulization Q6H PRN Tamala Julian, Rondell A, MD      . allopurinol (ZYLOPRIM) tablet 100 mg  100 mg Oral BID Fuller Plan A, MD   100 mg at 10/29/18 0855  . ALPRAZolam Duanne Moron) tablet 0.5 mg  0.5 mg Oral BID PRN Fuller Plan A, MD   0.5 mg at 10/28/18 2229  . bisacodyl (DULCOLAX) suppository 10 mg  10 mg Rectal Once Debbe Odea, MD      . carvedilol (COREG) tablet 6.25 mg  6.25 mg Oral BID WC Smith, Rondell A, MD   6.25 mg at 10/29/18 0854  . feeding supplement (ENSURE ENLIVE) (ENSURE ENLIVE) liquid 237 mL  237  mL Oral BID BM Fuller Plan A, MD   237 mL at 10/29/18 0858  . gabapentin (NEURONTIN) capsule 300 mg  300 mg Oral BID Fuller Plan A, MD   300 mg at 10/29/18 0854  . [START ON 10/30/2018] influenza vaccine adjuvanted (FLUAD) injection 0.5 mL  0.5 mL Intramuscular Tomorrow-1000 Smith, Rondell A, MD      . ondansetron (ZOFRAN) tablet 4 mg  4 mg Oral Q6H PRN Fuller Plan A, MD       Or  . ondansetron (ZOFRAN) injection 4 mg  4 mg Intravenous Q6H PRN Smith, Rondell A, MD      . pantoprazole (PROTONIX) EC tablet 40 mg  40 mg Oral Daily Tamala Julian, Rondell A, MD   40 mg at 10/29/18 0855  .  primidone (MYSOLINE) tablet 150 mg  150 mg Oral q morning - 10a Smith, Rondell A, MD   150 mg at 10/29/18 1032   And  . primidone (MYSOLINE) tablet 100 mg  100 mg Oral QHS Smith, Rondell A, MD   100 mg at 10/28/18 2231  . sodium chloride flush (NS) 0.9 % injection 3 mL  3 mL Intravenous Q12H Smith, Rondell A, MD   3 mL at 10/29/18 1033  . traMADol (ULTRAM) tablet 50 mg  50 mg Oral Q6H PRN Fuller Plan A, MD   50 mg at 10/29/18 1039     Discharge Medications: Please see discharge summary for a list of discharge medications.  Relevant Imaging Results:  Relevant Lab Results:   Additional Information SSN: [id number]  [place], BSN, NCM-BC, ACM-RN [phone]

## 2018-10-29 NOTE — Progress Notes (Signed)
Pt arrived around 2200-9/29: got pt settle in bed- secretary went  to retrieve bladder scanner- from another unit and noticed a visitor waiting in alcove by elevators. Upon inquiring learned that it was pt's son- he was asked to wait out there until she was settled. Not mentioned in report that family was present with her or that he was coming up- then transport failed to inform us that he was waiting outside the door. We allowed him to come in and see that his mother was settled. Planning to do a bladder scan on her because she hadn't urinated but a small amt and was fixing to ask him to step out- at which time I decided to broach the visitor policy. He was given the impression from downstairs that he could stay citing the fact that she has dementia. I attempted to clarify that is the case if pt safety is deemed a threat due to their cognitive level, but currently she is alert and appropriate, follows all requests. He made the comment that I am asking him to leave because he pointed out that she has edema, I stated that I am aware that she has generalized edema but pt denies itching, or trouble breathing: is in no acute distress, and so I don't believe this to be an allergic reaction, lung sounds are clear. Reassured him that we would continue to monitor.

## 2018-10-29 NOTE — Progress Notes (Signed)
PROGRESS NOTE    Caitlin Chavez  [id number] DOB: 05/25/1934 DOA: 10/28/2018 PC Outpatient Specialists:  Brief Narrative:  84 year old history hypertension hyponatremia diverticulosis GERD hiatal hernia presented to the emergency department 9/29 chief complaint generalized weakness.  Patient has a closed fracture of the right ankle and injury to the left ankle family reports unwilling/unable to get out of bed for the last 3 weeks.  Decreased oral intake, increased somnolence intermittent nausea and confusion.  Also complains of abdominal distention.   Assessment & Plan:   Principal Problem:   Failure to thrive in adult Active Problems:   Essential tremor   Hyponatremia   Generalized weakness   Hypoalbuminemia   Hiatal hernia   Essential hypertension   Anxiety   Ankle fracture, right   Falls   #1.  Sirs.  Acute.  Max temp 100.7 patient tachycardia with tachypnea leukocytosis.  Lactic acid within the limits of normal.  Chest x-ray reveals a hiatal hernia with atelectasis.  COVID-19 screening negative.  Urinalysis negative.  CT of the abdomen/pelvis Large sliding-type hiatal hernia. Small fat containing periumbilical hernia. No acute abnormality seen in the abdomen or pelvis.  Blood cultures with no growth to date. -Continue Zosyn for now -Low threshold for discontinuing as dont think infectious process  2.  Hyponatremia.  Acute on chronic.  Sodium 129 on admission.  Trending up slightly this morning after IV fluids.  Chart review indicates baseline close to 131.  -Gentle IV fluids -Serum osmolality -Urine osmolality -Urine sodium -Encourage oral intake -Advance diet  #3.  Generalized weakness/failure to thrive.  Patient with a right ankle fracture from a fall several weeks ago.  Since that time she has been immobile lethargic decreased oral intake.  May be related to pain medicine.  Spoke at length with patient about the need for her to participate in her care and to eat.  We  discussed short-term SNF at point of discharge and she agreed. -Encourage p.o. intake -Physical therapy -We will likely need placement -Avoid altering medications -Mobilize  #4.  Acute metabolic encephalopathy.  Much improved this morning.  Ammonia level within the limits of normal.  TSH within the limits of normal. -Monitor  #5.  Hypertension/tachycardia.  Fair control.  Home medications include Coreg hydrochlorothiazide benazepril. -Monitor -Continue Coreg -Hold benazepril and hydrochlorothiazide -We will give a small bolus IV fluids  #6.  Hiatal hernia.  Patient noted to have moderately large hiatal hernia on chest x-ray. -PPI -Monitor -OP GS consult vs inpatient  #7.  Essential tremor.  Chart review indicates recent neurology visit.  Home medications include primidone -Continue home meds   DVT prophylaxis: lovenox Code Status: full Family Communication: message for son Disposition Plan: likely need snf   Consultants:     Procedures:   Antimicrobials:  Zosyn>>>  Subjective: Awake alert but slightly drowsy. Reports pain both legs particularly right ankle.  Objective: Vitals:   10/28/18 1915 10/28/18 1930 10/28/18 2247 10/29/18 0440  BP: (!) 166/83 (!) 157/85 (!) 142/104 140/82  Pulse: (!) 101 (!) 103 (!) 102 (!) 103  Resp: (!) 21 17 18 18   Temp:   98.9 F (37.2 C) 97.9 F (36.6 C)  TempSrc:   Oral Oral  SpO2: 97% 99% 99% 99%  Weight:      Height:        Intake/Output Summary (Last 24 hours) at 10/29/2018 1003 Last data filed at 10/28/2018 1200 Gross per 24 hour  Intake -  Output 550 ml  Net -550  ml   Filed Weights   10/28/18 0314  Weight: 77.1 kg    Examination:  General exam: Appears calm and comfortable  Respiratory system: Clear to auscultation. Respiratory effort normal. Cardiovascular system: S1 & S2 heard, RRR. No JVD, murmurs, rubs, gallops or clicks. No pedal edema. Gastrointestinal system: Abdomen is nondistended, soft and nontender.  No organomegaly or masses felt. Normal bowel sounds heard. Central nervous system: Alert and oriented. No focal neurological deficits. Extremities: Symmetric 5 x 5 power. Skin: No rashes, lesions or ulcers Psychiatry: Judgement and insight appear normal. Mood & affect appropriate.     Data Reviewed: I have personally reviewed following labs and imaging studies  CBC: Recent Labs  Lab 10/28/18 0248 10/29/18 0211  WBC 10.7* 9.9  NEUTROABS 8.2*  --   HGB 10.4* 10.0*  HCT 29.5* 28.1*  MCV 94.6 93.4  PLT 288 [id number]   Basic Metabolic Panel: Recent Labs  Lab 10/28/18 0248 10/29/18 0211 10/29/18 0744  NA 129* 127* 131*  K 3.5 3.6 3.8  CL 92* 96* 96*  CO2 23 21* 20*  GLUCOSE 116* 113* 100*  BUN 19 16 17   CREATININE 1.06* 0.85 0.84  CALCIUM 9.1 8.3* 8.4*   GFR: Estimated Creatinine Clearance: 53 mL/min (by C-G formula based on SCr of 0.84 mg/dL). Liver Function Tests: Recent Labs  Lab 10/28/18 0248  AST 41  ALT 53*  ALKPHOS 74  BILITOT 0.7  PROT 6.1*  ALBUMIN 2.6*   No results for input(s): LIPASE, AMYLASE in the last 168 hours. Recent Labs  Lab 10/28/18 2210  AMMONIA 21   Coagulation Profile: No results for input(s): INR, PROTIME in the last 168 hours. Cardiac Enzymes: No results for input(s): CKTOTAL, CKMB, CKMBINDEX, TROPONINI in the last 168 hours. BNP (last 3 results) No results for input(s): PROBNP in the last 8760 hours. HbA1C: No results for input(s): HGBA1C in the last 72 hours. CBG: No results for input(s): GLUCAP in the last 168 hours. Lipid Profile: No results for input(s): CHOL, HDL, LDLCALC, TRIG, CHOLHDL, LDLDIRECT in the last 72 hours. Thyroid Function Tests: Recent Labs    10/28/18 2210  TSH 1.124   Anemia Panel: No results for input(s): VITAMINB12, FOLATE, FERRITIN, TIBC, IRON, RETICCTPCT in the last 72 hours. Urine analysis:    Component Value Date/Time   COLORURINE YELLOW 10/28/2018 0451   APPEARANCEUR CLEAR 10/28/2018 0451    LABSPEC 1.021 10/28/2018 0451   PHURINE 5.0 10/28/2018 0451   GLUCOSEU NEGATIVE 10/28/2018 0451   HGBUR NEGATIVE 10/28/2018 0451   BILIRUBINUR NEGATIVE 10/28/2018 0451   KETONESUR NEGATIVE 10/28/2018 0451   PROTEINUR NEGATIVE 10/28/2018 0451   NITRITE NEGATIVE 10/28/2018 0451   LEUKOCYTESUR NEGATIVE 10/28/2018 0451   Sepsis Labs: @LABRCNTIP (procalcitonin:4,lacticidven:4)  ) Recent Results (from the past 240 hour(s))  Urine culture     Status: None   Collection Time: 10/28/18  2:38 AM   Specimen: In/Out Cath Urine  Result Value Ref Range Status   Specimen Description IN/OUT CATH URINE  Final   Special Requests NONE  Final   Culture   Final    NO GROWTH Performed at [facility], [address] [place], [place] [address]    Report Status 10/29/2018 FINAL  Final  SARS Coronavirus 2 [facility] order, Performed in [facility] hospital lab) Nasopharyngeal Nasopharyngeal Swab     Status: None   Collection Time: 10/28/18  2:48 AM   Specimen: Nasopharyngeal Swab  Result Value Ref Range Status   SARS Coronavirus 2 NEGATIVE  NEGATIVE Final    Comment: (NOTE) If result is NEGATIVE SARS-CoV-2 target nucleic acids are NOT DETECTED. The SARS-CoV-2 RNA is generally detectable in upper and lower  respiratory specimens during the acute phase of infection. The lowest  concentration of SARS-CoV-2 viral copies this assay can detect is 250  copies / mL. A negative result does not preclude SARS-CoV-2 infection  and should not be used as the sole basis for treatment or other  patient management decisions.  A negative result may occur with  improper specimen collection / handling, submission of specimen other  than nasopharyngeal swab, presence of viral mutation(s) within the  areas targeted by this assay, and inadequate number of viral copies  (<250 copies / mL). A negative result must be combined with clinical  observations, patient history, and epidemiological information. If result is  POSITIVE SARS-CoV-2 target nucleic acids are DETECTED. The SARS-CoV-2 RNA is generally detectable in upper and lower  respiratory specimens dur ing the acute phase of infection.  Positive  results are indicative of active infection with SARS-CoV-2.  Clinical  correlation with patient history and other diagnostic information is  necessary to determine patient infection status.  Positive results do  not rule out bacterial infection or co-infection with other viruses. If result is PRESUMPTIVE POSTIVE SARS-CoV-2 nucleic acids MAY BE PRESENT.   A presumptive positive result was obtained on the submitted specimen  and confirmed on repeat testing.  While 2019 novel coronavirus  (SARS-CoV-2) nucleic acids may be present in the submitted sample  additional confirmatory testing may be necessary for epidemiological  and / or clinical management purposes  to differentiate between  SARS-CoV-2 and other Sarbecovirus currently known to infect humans.  If clinically indicated additional testing with an alternate test  methodology [id number]) is advised. The SARS-CoV-2 RNA is generally  detectable in upper and lower respiratory sp ecimens during the acute  phase of infection. The expected result is Negative. Fact Sheet for Patients:  StrictlyIdeas.no Fact Sheet for Healthcare Providers: BankingDealers.co.za This test is not yet approved or cleared by the [place] FDA and has been authorized for detection and/or diagnosis of SARS-CoV-2 by FDA under an Emergency Use Authorization (EUA).  This EUA will remain in effect (meaning this test can be used) for the duration of the COVID-19 declaration under Section 564(b)(1) of the Act, 21 U.S.C. section 360bbb-3(b)(1), unless the authorization is terminated or revoked sooner. Performed at [facility], [place] [address] [place], [place] [address]   Culture, blood (routine x 2)     Status: None  (Preliminary result)   Collection Time: 10/28/18  2:51 AM   Specimen: BLOOD  Result Value Ref Range Status   Specimen Description BLOOD RIGHT ARM  Final   Special Requests   Final    BOTTLES DRAWN AEROBIC AND ANAEROBIC Blood Culture results may not be optimal due to an excessive volume of blood received in culture bottles   Culture   Final    NO GROWTH 1 DAY Performed at [facility], [place] [address] [place], [place] [address]    Report Status PENDING  Incomplete  Culture, blood (routine x 2)     Status: None (Preliminary result)   Collection Time: 10/28/18  3:51 AM   Specimen: BLOOD  Result Value Ref Range Status   Specimen Description BLOOD RIGHT HAND  Final   Special Requests   Final    BOTTLES DRAWN AEROBIC AND ANAEROBIC Blood Culture adequate volume   Culture  Final    NO GROWTH 1 DAY Performed at [facility], [place] [address] [place], [place] [address]    Report Status PENDING  Incomplete         Radiology Studies: Ct Abdomen Pelvis Wo Contrast  Result Date: 10/28/2018 CLINICAL DATA:  Generalized weakness after fall 2 weeks ago. EXAM: CT ABDOMEN AND PELVIS WITHOUT CONTRAST TECHNIQUE: Multidetector CT imaging of the abdomen and pelvis was performed following the standard protocol without IV contrast. COMPARISON:  CT scan of November 21, 2015. FINDINGS: Lower chest: Large sliding-type hiatal hernia is noted. Visualized lung bases are unremarkable. Hepatobiliary: No focal liver abnormality is seen. Status post cholecystectomy. No biliary dilatation. Pancreas: Unremarkable. No pancreatic ductal dilatation or surrounding inflammatory changes. Spleen: Normal in size without focal abnormality. Adrenals/Urinary Tract: Adrenal glands are unremarkable. Kidneys are normal, without renal calculi, focal lesion, or hydronephrosis. Bladder is unremarkable. Stomach/Bowel: Stomach is within normal limits. Appendix appears normal. No evidence of bowel wall thickening, distention, or  inflammatory changes. Vascular/Lymphatic: Aortic atherosclerosis. No enlarged abdominal or pelvic lymph nodes. Reproductive: Status post hysterectomy. No adnexal masses. Other: Small fat containing periumbilical hernia is noted. No ascites is noted. Musculoskeletal: No acute or significant osseous findings. IMPRESSION: Large sliding-type hiatal hernia. Small fat containing periumbilical hernia. No acute abnormality seen in the abdomen or pelvis. Aortic Atherosclerosis (ICD10-I70.0). Electronically Signed   By: Marijo Conception M.D.   On: 10/28/2018 19:03   Dg Chest Portable 1 View  Result Date: 10/28/2018 CLINICAL DATA:  Fever. Generalized weakness. Fall 2 weeks ago. EXAM: PORTABLE CHEST 1 VIEW COMPARISON:  Radiograph 07/11/2015. FINDINGS: The cardiomediastinal contours are unchanged. Moderate retrocardiac hiatal hernia with adjacent atelectasis at the left lung base. Pulmonary vasculature is normal. No consolidation, pleural effusion, or pneumothorax. No acute osseous abnormalities are seen. IMPRESSION: 1. No acute abnormality. 2. Moderate retrocardiac hiatal hernia with adjacent atelectasis at the left lung base. Electronically Signed   By: Keith Rake M.D.   On: 10/28/2018 03:20        Scheduled Meds: . allopurinol  100 mg Oral BID  . carvedilol  6.25 mg Oral BID WC  . feeding supplement (ENSURE ENLIVE)  237 mL Oral BID BM  . gabapentin  300 mg Oral BID  . [START ON 10/30/2018] influenza vaccine adjuvanted  0.5 mL Intramuscular Tomorrow-1000  . pantoprazole  40 mg Oral Daily  . primidone  150 mg Oral q morning - 10a   And  . primidone  100 mg Oral QHS  . sodium chloride flush  3 mL Intravenous Q12H   Continuous Infusions: . sodium chloride 75 mL/hr at 10/29/18 0859     LOS: 0 days    Time spent: 45 minutes    [facility] M,NP [facility]   If 7PM-7AM, please contact night-coverage [email or web address] Password [email or web address] 10/29/2018, 10:03 AM

## 2018-10-29 NOTE — Progress Notes (Signed)
Physical Therapy Evaluation Patient Details Name: Caitlin Chavez MRN: [id number] DOB: Jul 02, 1934 Today's Date: 10/29/2018   History of Present Illness  84 y.o. female with medical history significant of hypertension, hyponatremia, diverticulosis, GERD, hiatal hernia, and arthritis; who presents with complaints of progressively worsening weakness. Pt with recent fall and R ankle fx (in boot), L ankle injury (in brace).  Clinical Impression  Pt demonstrates deficits in strength, power, functional mobility, gait, balance, and endurance. Pt requires physical assistance for all mobility at this time due to generalized weakness as well as balance and mobility impairments. Pt will benefit from continued acute PT services to reduce falls risk and caregiver burden. Recommendations: Pt will benefit from moderate intensity inpatient PT services upon discharge to aide in a progression to their prior level of function.    Follow Up Recommendations SNF(HHPT if pt declines placement)    Equipment Recommendations  Wheelchair (measurements PT);Wheelchair cushion (measurements PT)(if pt declines placement)    Recommendations for Other Services       Precautions / Restrictions Precautions Precautions: Fall Required Braces or Orthoses: Other Brace Other Brace: CAM boot RLE, ankle brace LLE Restrictions Weight Bearing Restrictions: No(must wear CAM boot and brace)      Mobility  Bed Mobility Overal bed mobility: Needs Assistance Bed Mobility: Supine to Sit     Supine to sit: Max assist     General bed mobility comments: HOB elevated 45 degrees, pt requiring assist for LE management and for scooting  Transfers Overall transfer level: Needs assistance Equipment used: Rolling walker (2 wheeled) Transfers: Sit to/from Stand Sit to Stand: Max assist         General transfer comment: Pt performs 3 sit to stand transfers with maxA and RW  Ambulation/Gait Ambulation/Gait assistance: Mod  assist Gait Distance (Feet): 4 Feet Assistive device: Rolling walker (2 wheeled) Gait Pattern/deviations: Step-to pattern;Decreased step length - right;Decreased step length - left(decreased foot clearance bilaterally) Gait velocity: reduced Gait velocity interpretation: <1.31 ft/sec, indicative of household ambulator    Financial trader Rankin (Stroke Patients Only)       Balance Overall balance assessment: Needs assistance Sitting-balance support: Feet supported;Bilateral upper extremity supported Sitting balance-Leahy Scale: Fair     Standing balance support: Bilateral upper extremity supported Standing balance-Leahy Scale: Poor                               Pertinent Vitals/Pain Pain Assessment: Faces Faces Pain Scale: Hurts little more Pain Location: BLE Pain Descriptors / Indicators: Aching Pain Intervention(s): Limited activity within patient's tolerance    Home Living Family/patient expects to be discharged to:: Private residence Living Arrangements: Children Available Help at Discharge: Family Type of Home: House Home Access: Stairs to enter Entrance Stairs-Rails: Can reach both Entrance Stairs-Number of Steps: 2 Home Layout: Multi-level;Able to live on main level with bedroom/bathroom Home Equipment: Gilford Rile - 2 wheels;Cane - single point      Prior Function Level of Independence: Needs assistance   Gait / Transfers Assistance Needed: Prior to fall was independent for household mobility, ambulating with intermittent use of RW.  ADL's / Homemaking Assistance Needed: Son's assist in IADLs        Hand Dominance   Dominant Hand: Right    Extremity/Trunk Assessment   Upper Extremity Assessment Upper Extremity Assessment: Generalized weakness(intention tremor in BUE)  Lower Extremity Assessment Lower Extremity Assessment: Generalized weakness    Cervical / Trunk Assessment Cervical / Trunk  Assessment: Kyphotic  Communication   Communication: No difficulties  Cognition Arousal/Alertness: Awake/alert Behavior During Therapy: WFL for tasks assessed/performed Overall Cognitive Status: Within Functional Limits for tasks assessed                                        General Comments      Exercises     Assessment/Plan    PT Assessment Patient needs continued PT services  PT Problem List Decreased strength;Decreased activity tolerance;Decreased balance;Decreased mobility;Decreased knowledge of use of DME;Decreased safety awareness       PT Treatment Interventions DME instruction;Gait training;Stair training;Functional mobility training;Therapeutic activities;Therapeutic exercise;Balance training;Neuromuscular re-education;Patient/family education    PT Goals (Current goals can be found in the Care Plan section)  Acute Rehab PT Goals Patient Stated Goal: To go home PT Goal Formulation: With patient/family Time For Goal Achievement: 11/12/18 Potential to Achieve Goals: Good    Frequency Min 2X/week   Barriers to discharge        Co-evaluation               AM-PAC PT "6 Clicks" Mobility  Outcome Measure Help needed turning from your back to your side while in a flat bed without using bedrails?: A Lot Help needed moving from lying on your back to sitting on the side of a flat bed without using bedrails?: Total Help needed moving to and from a bed to a chair (including a wheelchair)?: Total Help needed standing up from a chair using your arms (e.g., wheelchair or bedside chair)?: Total Help needed to walk in hospital room?: Total Help needed climbing 3-5 steps with a railing? : Total 6 Click Score: 7    End of Session Equipment Utilized During Treatment: Gait belt Activity Tolerance: Patient tolerated treatment well Patient left: in chair;with call bell/phone within reach;with chair alarm set;with family/visitor present Nurse  Communication: Mobility status PT Visit Diagnosis: Other abnormalities of gait and mobility (R26.89)    Time: [id number] PT Time Calculation (min) (ACUTE ONLY): 59 min   Charges:   PT Evaluation $PT Eval Low Complexity: 1 Low PT Treatments $Therapeutic Activity: 23-37 mins        Zenaida Niece, PT, DPT Acute Rehabilitation Pager: [phone]   Zenaida Niece 10/29/2018, 11:46 AM

## 2018-10-30 ENCOUNTER — Ambulatory Visit: Payer: Medicare Other | Admitting: Family

## 2018-10-30 ENCOUNTER — Inpatient Hospital Stay (HOSPITAL_COMMUNITY): Payer: Medicare Other

## 2018-10-30 LAB — BASIC METABOLIC PANEL
Anion gap: 14 (ref 5–15)
BUN: 15 mg/dL (ref 8–23)
CO2: 22 mmol/L (ref 22–32)
Calcium: 8.9 mg/dL (ref 8.9–10.3)
Chloride: 95 mmol/L — ABNORMAL LOW (ref 98–111)
Creatinine, Ser: 0.75 mg/dL (ref 0.44–1.00)
GFR calc Af Amer: >60 mL/min (ref >60)
GFR calc non Af Amer: >60 mL/min (ref >60)
Glucose, Bld: 110 mg/dL — ABNORMAL HIGH (ref 70–99)
Potassium: 3.4 mmol/L — ABNORMAL LOW (ref 3.5–5.1)
Sodium: 131 mmol/L — ABNORMAL LOW (ref 135–145)

## 2018-10-30 LAB — RESPIRATORY PANEL BY PCR

## 2018-10-30 NOTE — Plan of Care (Addendum)
  Problem: Elimination: Goal: Will not experience complications related to bowel motility Outcome: Progressing   Problem: Activity: Goal: Risk for activity intolerance will decrease Outcome: Progressing   Noted pt with only 50 ml in purewick cannister. Bladder scanned result in 290 ml. 3rd time for  I/O cath.  Resulted amt of 480 ml. Will f/u with dayshift nurse noted orders to place foley after 3rd I/O cath.

## 2018-10-30 NOTE — Evaluation (Signed)
Occupational Therapy Evaluation Patient Details Name: Caitlin Chavez MRN: [id number] DOB: 12/05/1934 Today's Date: 10/30/2018    History of Present Illness 84 y.o. female with medical history significant of hypertension, hyponatremia, diverticulosis, GERD, hiatal hernia, and arthritis; who presents with complaints of progressively worsening weakness. Pt with recent fall and R ankle fx (in boot), L ankle injury (in brace).   Clinical Impression   Eval limited due pt's pain in R LE and fatigue, pt agreeable to bed level activity. Pt recently working with PT and up In recliner for several hours today. Pt with decline in function and safety with ADLs and ADL mobility with impaired strength, balance and endurance. PTA, pt lived at home with her son and was independent with ADLs/selfcare and used RW for mobility with hx of falls. Pt currently requires extensive assist with ADLs and mobility but does not want to got to a SNF for ST rehab. Pt would benefit from acute OT services to address impairments to maximize level of function and safety    Follow Up Recommendations  SNF    Equipment Recommendations  None recommended by OT    Recommendations for Other Services       Precautions / Restrictions Precautions Precautions: Fall Required Braces or Orthoses: Other Brace Other Brace: CAM boot RLE, ankle brace LLE Restrictions Weight Bearing Restrictions: No      Mobility Bed Mobility Overal bed mobility: Needs Assistance Bed Mobility: Rolling Rolling: Mod assist   Supine to sit: Mod assist Sit to supine: Mod assist   General bed mobility comments: pt declined sitting EOB due to pain and fatigue  Transfers Overall transfer level: Needs assistance Equipment used: Rolling walker (2 wheeled) Transfers: Sit to/from Stand Sit to Stand: Max assist         General transfer comment: pt declined sitting OOB due to pain and fatigue, per PT note today pt is max A    Balance Overall  balance assessment: Needs assistance Sitting-balance support: Bilateral upper extremity supported;Feet supported Sitting balance-Leahy Scale: Fair     Standing balance support: Bilateral upper extremity supported Standing balance-Leahy Scale: Poor                             ADL either performed or assessed with clinical judgement   ADL Overall ADL's : Needs assistance/impaired Eating/Feeding: Independent;Sitting   Grooming: Wash/dry hands;Wash/dry face;Set up;Bed level   Upper Body Bathing: Minimal assistance;Bed level   Lower Body Bathing: Maximal assistance   Upper Body Dressing : Minimal assistance;Bed level   Lower Body Dressing: Maximal assistance     Toilet Transfer Details (indicate cue type and reason): pt declined due ot fatigue Toileting- Clothing Manipulation and Hygiene: Total assistance;Bed level         General ADL Comments: pt declined OOB/EOB activity due to pain and fatigue     Vision Baseline Vision/History: Wears glasses Patient Visual Report: No change from baseline       Perception     Praxis      Pertinent Vitals/Pain Pain Assessment: 0-10 Pain Score: 6  Faces Pain Scale: Hurts a little bit Pain Location: BLE Pain Descriptors / Indicators: Aching;Sore Pain Intervention(s): Limited activity within patient's tolerance;Monitored during session;Repositioned     Hand Dominance Right   Extremity/Trunk Assessment Upper Extremity Assessment Upper Extremity Assessment: Generalized weakness   Lower Extremity Assessment Lower Extremity Assessment: Defer to PT evaluation       Communication Communication Communication: No  difficulties   Cognition Arousal/Alertness: Awake/alert Behavior During Therapy: WFL for tasks assessed/performed Overall Cognitive Status: Within Functional Limits for tasks assessed                                     General Comments       Exercises    Shoulder Instructions       Home Living Family/patient expects to be discharged to:: Private residence Living Arrangements: Children Available Help at Discharge: Family Type of Home: House Home Access: Stairs to enter Technical brewer of Steps: 2 Entrance Stairs-Rails: Can reach both Home Layout: Multi-level;Able to live on main level with bedroom/bathroom     Bathroom Shower/Tub: Teacher, early years/pre: Standard     Home Equipment: Environmental consultant - 2 wheels;Cane - single point          Prior Functioning/Environment    Gait / Transfers Assistance Needed: Prior to fall was independent for household mobility, ambulating with intermittent use of RW. ADL's / Homemaking Assistance Needed: independent with ADLs            OT Problem List: Decreased strength;Impaired balance (sitting and/or standing);Pain;Decreased activity tolerance;Decreased knowledge of use of DME or AE      OT Treatment/Interventions: Self-care/ADL training;Therapeutic activities;DME and/or AE instruction;Therapeutic exercise;Patient/family education    OT Goals(Current goals can be found in the care plan section) Acute Rehab OT Goals Patient Stated Goal: To go home OT Goal Formulation: With patient Time For Goal Achievement: 11/13/18 Potential to Achieve Goals: Good ADL Goals Pt Will Perform Grooming: with min guard assist;sitting Pt Will Perform Upper Body Bathing: with min guard assist;sitting Pt Will Perform Lower Body Bathing: with mod assist;sitting/lateral leans Pt Will Perform Upper Body Dressing: with min guard assist;sitting Pt Will Transfer to Toilet: with mod assist;bedside commode;stand pivot transfer  OT Frequency: Min 2X/week   Barriers to D/C: Decreased caregiver support          Co-evaluation              AM-PAC OT "6 Clicks" Daily Activity     Outcome Measure Help from another person eating meals?: None Help from another person taking care of personal grooming?: A Lot Help from  another person toileting, which includes using toliet, bedpan, or urinal?: A Lot Help from another person bathing (including washing, rinsing, drying)?: A Lot Help from another person to put on and taking off regular upper body clothing?: A Little Help from another person to put on and taking off regular lower body clothing?: A Lot 6 Click Score: 15   End of Session    Activity Tolerance: Patient limited by fatigue;Patient limited by pain Patient left: in bed  OT Visit Diagnosis: Other abnormalities of gait and mobility (R26.89);History of falling (Z91.81);Pain;Muscle weakness (generalized) (M62.81) Pain - Right/Left: Right Pain - part of body: Leg;Ankle and joints of foot                Time: 1447-1510 OT Time Calculation (min): 23 min Charges:  OT General Charges $OT Visit: 1 Visit OT Evaluation $OT Eval Moderate Complexity: 1 Mod   Britt Bottom 10/30/2018, 3:21 PM

## 2018-10-30 NOTE — Progress Notes (Signed)
Familiar with this patient, has this pt last night. Noted same pattern of spiking temps and elevated pulse. Noted blood cultures are pending. Gave tylenol for the elevated temperature. Monitoring continued.

## 2018-10-30 NOTE — Progress Notes (Signed)
Physical Therapy Treatment Patient Details Name: Caitlin Chavez MRN: [id number] DOB: 02/20/1934 Today's Date: 10/30/2018    History of Present Illness 84 y.o. female with medical history significant of hypertension, hyponatremia, diverticulosis, GERD, hiatal hernia, and arthritis; who presents with complaints of progressively worsening weakness. Pt with recent fall and R ankle fx (in boot), L ankle injury (in brace).    PT Comments    Pt tolerated treatment well, although fatigued from sitting up for most of the day. Pt continues to require significant assistance for all functional mobility and remains a high falls risk. Pt with increased work of breathing throughout mobility portion of session. Family present and supportive, recording HEP for patient in notebook. Pt will continue to benefit from PT POC to improve strength, functional mobility, and reduce falls risk.   Follow Up Recommendations  SNF     Equipment Recommendations  Wheelchair (measurements PT);Wheelchair cushion (measurements PT)    Recommendations for Other Services       Precautions / Restrictions Precautions Precautions: Fall Required Braces or Orthoses: Other Brace Other Brace: CAM boot RLE, ankle brace LLE Restrictions Weight Bearing Restrictions: No    Mobility  Bed Mobility Overal bed mobility: Needs Assistance Bed Mobility: Sit to Supine       Sit to supine: Mod assist   General bed mobility comments: PT assisting BLE  Transfers Overall transfer level: Needs assistance Equipment used: Rolling walker (2 wheeled) Transfers: Sit to/from Stand Sit to Stand: Max assist         General transfer comment: pt performs 2 sit to stands from recliner  Ambulation/Gait Ambulation/Gait assistance: Mod assist Gait Distance (Feet): 3 Feet Assistive device: Rolling walker (2 wheeled) Gait Pattern/deviations: Step-to pattern;Decreased step length - right;Decreased step length - left Gait velocity:  reduced Gait velocity interpretation: <1.31 ft/sec, indicative of household ambulator General Gait Details: short step to shuffling gait with significantly slowed gait speed   Stairs             Wheelchair Mobility    Modified Rankin (Stroke Patients Only)       Balance Overall balance assessment: Needs assistance Sitting-balance support: Bilateral upper extremity supported;Feet supported Sitting balance-Leahy Scale: Fair     Standing balance support: Bilateral upper extremity supported Standing balance-Leahy Scale: Poor                              Cognition Arousal/Alertness: Awake/alert Behavior During Therapy: WFL for tasks assessed/performed Overall Cognitive Status: Within Functional Limits for tasks assessed                                        Exercises General Exercises - Lower Extremity Ankle Circles/Pumps: AROM;Both;5 reps Quad Sets: AROM;Both;5 reps Gluteal Sets: AROM;Both;5 reps    General Comments        Pertinent Vitals/Pain Pain Assessment: Faces Faces Pain Scale: Hurts a little bit Pain Location: BLE Pain Descriptors / Indicators: Aching Pain Intervention(s): Limited activity within patient's tolerance    Home Living                      Prior Function            PT Goals (current goals can now be found in the care plan section) Progress towards PT goals: Progressing toward goals    Frequency  Min 2X/week      PT Plan Current plan remains appropriate    Co-evaluation              AM-PAC PT "6 Clicks" Mobility   Outcome Measure  Help needed turning from your back to your side while in a flat bed without using bedrails?: A Lot Help needed moving from lying on your back to sitting on the side of a flat bed without using bedrails?: A Lot Help needed moving to and from a bed to a chair (including a wheelchair)?: A Lot Help needed standing up from a chair using your arms (e.g.,  wheelchair or bedside chair)?: Total Help needed to walk in hospital room?: Total Help needed climbing 3-5 steps with a railing? : Total 6 Click Score: 9    End of Session Equipment Utilized During Treatment: Gait belt Activity Tolerance: Patient tolerated treatment well Patient left: in bed;with call bell/phone within reach;with bed alarm set;with nursing/sitter in room;with family/visitor present Nurse Communication: Mobility status PT Visit Diagnosis: Other abnormalities of gait and mobility (R26.89)     Time: 1350-1406 PT Time Calculation (min) (ACUTE ONLY): 16 min  Charges:  $Therapeutic Activity: 8-22 mins                     Zenaida Niece, PT, DPT Acute Rehabilitation Pager: [phone]    Zenaida Niece 10/30/2018, 2:53 PM

## 2018-10-30 NOTE — Plan of Care (Signed)
  Problem: Education: Goal: Knowledge of General Education information will improve Description Including pain rating scale, medication(s)/side effects and non-pharmacologic comfort measures Outcome: Progressing   

## 2018-10-30 NOTE — Progress Notes (Addendum)
Pt temp elevated. VS and assessment noted. Pt denies any change in condition. PO acetaminophen given. Encouraged and coughed through IS use. MD notified of pt elevated temp. Blood cultures pending and to continue current tx at this time. Held flu vaccine administration at this time.  MEWS Guidelines - (patients age 82 and over)  Red - At High Risk for Deterioration Yellow - At risk for Deterioration  1. Go to room and assess patient 2. Validate data. Is this patient's baseline? If data confirmed: 3. Is this an acute change? 4. Administer prn meds/treatments as ordered. 5. Note Sepsis score 6. Review goals of care 7. Sports coach, RRT nurse and Provider. 8. Ask Provider to come to bedside.  9. Document patient condition/interventions/response. 10. Increase frequency of vital signs and focused assessments to at least q15 minutes x 4, then q30 minutes x2. - If stable, then q1h x3, then q4h x3 and then q8h or dept. routine. - If unstable, contact Provider & RRT nurse. Prepare for possible transfer. 11. Add entry in progress notes using the smart phrase ".MEWS". 1. Go to room and assess patient 2. Validate data. Is this patient's baseline? If data confirmed: 3. Is this an acute change? 4. Administer prn meds/treatments as ordered? 5. Note Sepsis score 6. Review goals of care 7. Sports coach and Provider 8. Call RRT nurse as needed. 9. Document patient condition/interventions/response. 10. Increase frequency of vital signs and focused assessments to at least q2h x2. - If stable, then q4h x2 and then q8h or dept. routine. - If unstable, contact Provider & RRT nurse. Prepare for possible transfer. 11. Add entry in progress notes using the smart phrase ".MEWS".  Green - Likely stable Lavender - Comfort Care Only  1. Continue routine/ordered monitoring.  2. Review goals of care. 1. Continue routine/ordered monitoring. 2. Review goals of care.

## 2018-10-30 NOTE — Progress Notes (Addendum)
PROGRESS NOTE    Caitlin Chavez  [id number] DOB: Nov 26, 1934 DOA: 10/28/2018    Brief Narrative:  Patient is 84 year old history hypertension, hyponatremia, diverticulosis, GERD hiatal hernia presented to the emergency department on 9/29 with chief complaint of generalized weakness.  Patient has a closed fracture of the right ankle and injury to the left ankle. Family reported that the patient was unwilling/unable to get out of bed for the last 3 weeks.  There is also a history of decreased oral intake, increased somnolence, intermittent nausea and confusion.  Assessment & Plan:   Principal Problem:   Failure to thrive in adult Active Problems:   Essential tremor   Falls   Hiatal hernia   Hyponatremia   Generalized weakness   Essential hypertension   Hypoalbuminemia   Anxiety   Ankle fracture, right  Generalized weakness/failure to thrive.   History of fall and right ankle fracture with diminished mobility and oral intake.  Patient has been seen by physical therapy who recommended skilled nursing facility placement.  Patient has been encouraged to improve her oral intake.  Systemic inflammatory response syndrome on presentation..  T-max of 101.  Patient also had tachycardia with tachypnea, leukocytosis.  Lactic acid within the normal limits.  Chest x-ray revealed a hiatal hernia with atelectasis.  COVID-19 screening was negative.  Urinalysis negative.  Urine culture is negative so far.  CT of the abdomen/pelvis Large sliding-type hiatal hernia, small fat containing periumbilical hernia. No acute abnormality seen in the abdomen or pelvis.  Blood cultures repeated on 9/3 is negative in 12 hours..  Patient is currently on Zosyn will continue with that.  Will de-escalate antibiotic once the culture is finalized.  We will obtain an x-ray of the left ankle.  Check ultrasound of the lower extremity for DVT.  Right ankle pain.  Will obtain x-ray of the left ankle.  Acute on chronic  hyponatremia..  Sodium 129 on admission.  Latest sodium of 131 which is likely at baseline.  Patient received gentle IV fluids during hospitalization.  Urine osmolality is more than 800 while serum osmolality is 270.  Acute metabolic encephalopathy.  Improved.  TSH and ammonia within normal limits.  Will closely monitor.  Essential hypertension  Home medications include Coreg hydrochlorothiazide benazepril. Continue Coreg.  Continue to hold benazepril and hydrochlorothiazide.  Blood pressure has improved at this time.  Hiatal hernia.  Patient noted to have moderately large hiatal hernia on chest x-ray. Asymptomatic at this time.  Continue PPI.  Essential tremor.  Chart review indicates recent neurology visit.  Continue primidone from home  Constipation.  Continue bowel regimen.  DVT prophylaxis: lovenox  Code Status: full  Family Communication: I spoke with Tammy the patients daughter and updated her about the clinical condition of the patient and answered lot of queries. Will also add Respiratory viral panel to evaluate for fever.   Disposition Plan:  Skilled nursing facility placement as per PT evaluation.  Continue to monitor for fever, continue antibiotics.  We will also obtain an x-ray of the right ankle and a DVT scan of the lower extremity to rule out the reason for fever  Consultants:   None  Procedures:  None  Antimicrobials:  Zosyn>>>  Subjective: Today, patient complains of right ankle pain.  Denies any shortness of breath, chest pain or cough.  Patient was noted to have a fever today.  Objective: Vitals:   10/29/18 2226 10/30/18 0350 10/30/18 0657 10/30/18 0804  BP: 128/75 (!) 158/96 (!) 151/89 Marland Kitchen)  146/72  Pulse: 95 (!) 108 (!) 108 (!) 105  Resp: 17 16 16 14   Temp: 98.9 F (37.2 C) 100 F (37.8 C) 99 F (37.2 C) (!) 101 F (38.3 C)  TempSrc: Oral Oral Oral Oral  SpO2: 96% 97% 98% 95%  Weight:      Height:        Intake/Output Summary (Last 24 hours) at  10/30/2018 1046 Last data filed at 10/30/2018 0500 Gross per 24 hour  Intake 1801.17 ml  Output 650 ml  Net 1151.17 ml   Filed Weights   10/28/18 0314  Weight: 77.1 kg    Physical examination: General:  Average built, not in obvious distress, febrile HENT: Normocephalic, pupils equally reacting to light and accommodation.  No scleral pallor or icterus noted. Oral mucosa is moist.  Chest:  Clear breath sounds.  Diminished breath sounds bilaterally. No crackles or wheezes.  CVS: S1 &S2 heard. No murmur.  Regular rate and rhythm. Abdomen: Soft, nontender, nondistended.  Bowel sounds are heard.  Liver is not palpable, no abdominal mass palpated Extremities: No cyanosis, clubbing or edema.  Peripheral pulses are palpable.  Right ankle with brace,  Left ankle edema Psych: Alert, awake and communicative,  CNS:  No cranial nerve deficits.  Power equal in all extremities.  No sensory deficits noted.  No cerebellar signs.   Skin: Warm and dry.  No rashes noted.   Data Reviewed: I have personally reviewed following labs and imaging studies  CBC: Recent Labs  Lab 10/28/18 0248 10/29/18 0211  WBC 10.7* 9.9  NEUTROABS 8.2*  --   HGB 10.4* 10.0*  HCT 29.5* 28.1*  MCV 94.6 93.4  PLT 288 [id number]   Basic Metabolic Panel: Recent Labs  Lab 10/28/18 0248 10/29/18 0211 10/29/18 0744 10/30/18 0246  NA 129* 127* 131* 131*  K 3.5 3.6 3.8 3.4*  CL 92* 96* 96* 95*  CO2 23 21* 20* 22  GLUCOSE 116* 113* 100* 110*  BUN 19 16 17 15   CREATININE 1.06* 0.85 0.84 0.75  CALCIUM 9.1 8.3* 8.4* 8.9   GFR: Estimated Creatinine Clearance: 55.6 mL/min (by C-G formula based on SCr of 0.75 mg/dL). Liver Function Tests: Recent Labs  Lab 10/28/18 0248  AST 41  ALT 53*  ALKPHOS 74  BILITOT 0.7  PROT 6.1*  ALBUMIN 2.6*   No results for input(s): LIPASE, AMYLASE in the last 168 hours. Recent Labs  Lab 10/28/18 2210  AMMONIA 21   Coagulation Profile: No results for input(s): INR, PROTIME in the  last 168 hours. Cardiac Enzymes: Recent Labs  Lab 10/29/18 1143  CKTOTAL 42   BNP (last 3 results) No results for input(s): PROBNP in the last 8760 hours. HbA1C: No results for input(s): HGBA1C in the last 72 hours. CBG: No results for input(s): GLUCAP in the last 168 hours. Lipid Profile: No results for input(s): CHOL, HDL, LDLCALC, TRIG, CHOLHDL, LDLDIRECT in the last 72 hours. Thyroid Function Tests: Recent Labs    10/28/18 2210  TSH 1.124   Anemia Panel: No results for input(s): VITAMINB12, FOLATE, FERRITIN, TIBC, IRON, RETICCTPCT in the last 72 hours. Urine analysis:    Component Value Date/Time   COLORURINE YELLOW 10/28/2018 0451   APPEARANCEUR CLEAR 10/28/2018 0451   LABSPEC 1.021 10/28/2018 0451   PHURINE 5.0 10/28/2018 0451   GLUCOSEU NEGATIVE 10/28/2018 0451   HGBUR NEGATIVE 10/28/2018 0451   BILIRUBINUR NEGATIVE 10/28/2018 0451   KETONESUR NEGATIVE 10/28/2018 0451   PROTEINUR NEGATIVE 10/28/2018 0451   NITRITE  NEGATIVE 10/28/2018 0451   LEUKOCYTESUR NEGATIVE 10/28/2018 0451   Sepsis Labs: @LABRCNTIP (procalcitonin:4,lacticidven:4)  ) Recent Results (from the past 240 hour(s))  Urine culture     Status: None   Collection Time: 10/28/18  2:38 AM   Specimen: In/Out Cath Urine  Result Value Ref Range Status   Specimen Description IN/OUT CATH URINE  Final   Special Requests NONE  Final   Culture   Final    NO GROWTH Performed at [facility], [place] [address], [place], [place] [address]    Report Status 10/29/2018 FINAL  Final  SARS Coronavirus 2 [facility] order, Performed in [facility] hospital lab) Nasopharyngeal Nasopharyngeal Swab     Status: None   Collection Time: 10/28/18  2:48 AM   Specimen: Nasopharyngeal Swab  Result Value Ref Range Status   SARS Coronavirus 2 NEGATIVE NEGATIVE Final    Comment: (NOTE) If result is NEGATIVE SARS-CoV-2 target nucleic acids are NOT DETECTED. The SARS-CoV-2 RNA is generally detectable in upper and lower   respiratory specimens during the acute phase of infection. The lowest  concentration of SARS-CoV-2 viral copies this assay can detect is 250  copies / mL. A negative result does not preclude SARS-CoV-2 infection  and should not be used as the sole basis for treatment or other  patient management decisions.  A negative result may occur with  improper specimen collection / handling, submission of specimen other  than nasopharyngeal swab, presence of viral mutation(s) within the  areas targeted by this assay, and inadequate number of viral copies  (<250 copies / mL). A negative result must be combined with clinical  observations, patient history, and epidemiological information. If result is POSITIVE SARS-CoV-2 target nucleic acids are DETECTED. The SARS-CoV-2 RNA is generally detectable in upper and lower  respiratory specimens dur ing the acute phase of infection.  Positive  results are indicative of active infection with SARS-CoV-2.  Clinical  correlation with patient history and other diagnostic information is  necessary to determine patient infection status.  Positive results do  not rule out bacterial infection or co-infection with other viruses. If result is PRESUMPTIVE POSTIVE SARS-CoV-2 nucleic acids MAY BE PRESENT.   A presumptive positive result was obtained on the submitted specimen  and confirmed on repeat testing.  While 2019 novel coronavirus  (SARS-CoV-2) nucleic acids may be present in the submitted sample  additional confirmatory testing may be necessary for epidemiological  and / or clinical management purposes  to differentiate between  SARS-CoV-2 and other Sarbecovirus currently known to infect humans.  If clinically indicated additional testing with an alternate test  methodology [id number]) is advised. The SARS-CoV-2 RNA is generally  detectable in upper and lower respiratory sp ecimens during the acute  phase of infection. The expected result is Negative. Fact  Sheet for Patients:  StrictlyIdeas.no Fact Sheet for Healthcare Providers: BankingDealers.co.za This test is not yet approved or cleared by the [place] FDA and has been authorized for detection and/or diagnosis of SARS-CoV-2 by FDA under an Emergency Use Authorization (EUA).  This EUA will remain in effect (meaning this test can be used) for the duration of the COVID-19 declaration under Section 564(b)(1) of the Act, 21 U.S.C. section 360bbb-3(b)(1), unless the authorization is terminated or revoked sooner. Performed at [facility], [place] [address] [place], [place] [address]   Culture, blood (routine x 2)     Status: None (Preliminary result)   Collection Time: 10/28/18  2:51 AM   Specimen:  BLOOD  Result Value Ref Range Status   Specimen Description BLOOD RIGHT ARM  Final   Special Requests   Final    BOTTLES DRAWN AEROBIC AND ANAEROBIC Blood Culture results may not be optimal due to an excessive volume of blood received in culture bottles   Culture   Final    NO GROWTH 2 DAYS Performed at [facility] [address] [place], [place] [address]    Report Status PENDING  Incomplete  Culture, blood (routine x 2)     Status: None (Preliminary result)   Collection Time: 10/28/18  3:51 AM   Specimen: BLOOD  Result Value Ref Range Status   Specimen Description BLOOD RIGHT HAND  Final   Special Requests   Final    BOTTLES DRAWN AEROBIC AND ANAEROBIC Blood Culture adequate volume   Culture   Final    NO GROWTH 2 DAYS Performed at [facility], [place] [address], [place], [place] [address]    Report Status PENDING  Incomplete  Culture, blood (routine x 2)     Status: None (Preliminary result)   Collection Time: 10/29/18  6:57 PM   Specimen: BLOOD LEFT HAND  Result Value Ref Range Status   Specimen Description BLOOD LEFT HAND  Final   Special Requests   Final    BOTTLES DRAWN AEROBIC AND ANAEROBIC Blood Culture  results may not be optimal due to an inadequate volume of blood received in culture bottles   Culture   Final    NO GROWTH < 12 HOURS Performed at [facility], [place] [address], [place], [place] [address]    Report Status PENDING  Incomplete  Culture, blood (routine x 2)     Status: None (Preliminary result)   Collection Time: 10/29/18  7:03 PM   Specimen: BLOOD RIGHT HAND  Result Value Ref Range Status   Specimen Description BLOOD RIGHT HAND  Final   Special Requests   Final    BOTTLES DRAWN AEROBIC AND ANAEROBIC Blood Culture results may not be optimal due to an inadequate volume of blood received in culture bottles   Culture   Final    NO GROWTH < 12 HOURS Performed at [facility], [place] [address], [place], [place] [address]    Report Status PENDING  Incomplete       Radiology Studies: Ct Abdomen Pelvis Wo Contrast  Result Date: 10/28/2018 CLINICAL DATA:  Generalized weakness after fall 2 weeks ago. EXAM: CT ABDOMEN AND PELVIS WITHOUT CONTRAST TECHNIQUE: Multidetector CT imaging of the abdomen and pelvis was performed following the standard protocol without IV contrast. COMPARISON:  CT scan of November 21, 2015. FINDINGS: Lower chest: Large sliding-type hiatal hernia is noted. Visualized lung bases are unremarkable. Hepatobiliary: No focal liver abnormality is seen. Status post cholecystectomy. No biliary dilatation. Pancreas: Unremarkable. No pancreatic ductal dilatation or surrounding inflammatory changes. Spleen: Normal in size without focal abnormality. Adrenals/Urinary Tract: Adrenal glands are unremarkable. Kidneys are normal, without renal calculi, focal lesion, or hydronephrosis. Bladder is unremarkable. Stomach/Bowel: Stomach is within normal limits. Appendix appears normal. No evidence of bowel wall thickening, distention, or inflammatory changes. Vascular/Lymphatic: Aortic atherosclerosis. No enlarged abdominal or pelvic lymph nodes. Reproductive: Status post  hysterectomy. No adnexal masses. Other: Small fat containing periumbilical hernia is noted. No ascites is noted. Musculoskeletal: No acute or significant osseous findings. IMPRESSION: Large sliding-type hiatal hernia. Small fat containing periumbilical hernia. No acute abnormality seen in the abdomen or pelvis. Aortic Atherosclerosis (  ICD10-I70.0). Electronically Signed   By: Marijo Conception M.D.   On: 10/28/2018 19:03    Scheduled Meds: . allopurinol  100 mg Oral BID  . bisacodyl  10 mg Rectal Once  . carvedilol  6.25 mg Oral BID WC  . feeding supplement (ENSURE ENLIVE)  237 mL Oral BID BM  . gabapentin  300 mg Oral BID  . influenza vaccine adjuvanted  0.5 mL Intramuscular Tomorrow-1000  . pantoprazole  40 mg Oral Daily  . primidone  150 mg Oral q morning - 10a   And  . primidone  100 mg Oral QHS  . sodium chloride flush  3 mL Intravenous Q12H   Continuous Infusions:    LOS: 1 day    Noam Karaffa,MD [facility] 10/30/2018, 10:46 AM

## 2018-10-31 ENCOUNTER — Inpatient Hospital Stay (HOSPITAL_COMMUNITY): Payer: Medicare Other

## 2018-10-31 ENCOUNTER — Encounter (HOSPITAL_COMMUNITY): Payer: Self-pay | Admitting: Infectious Disease

## 2018-10-31 DIAGNOSIS — S82891D Other fracture of right lower leg, subsequent encounter for closed fracture with routine healing: Secondary | ICD-10-CM

## 2018-10-31 DIAGNOSIS — F0390 Unspecified dementia without behavioral disturbance: Secondary | ICD-10-CM

## 2018-10-31 DIAGNOSIS — R509 Fever, unspecified: Secondary | ICD-10-CM

## 2018-10-31 DIAGNOSIS — Z8719 Personal history of other diseases of the digestive system: Secondary | ICD-10-CM

## 2018-10-31 DIAGNOSIS — M25572 Pain in left ankle and joints of left foot: Secondary | ICD-10-CM

## 2018-10-31 DIAGNOSIS — W19XXXD Unspecified fall, subsequent encounter: Secondary | ICD-10-CM

## 2018-10-31 DIAGNOSIS — E871 Hypo-osmolality and hyponatremia: Secondary | ICD-10-CM

## 2018-10-31 DIAGNOSIS — M25571 Pain in right ankle and joints of right foot: Secondary | ICD-10-CM | POA: Diagnosis present

## 2018-10-31 DIAGNOSIS — K5790 Diverticulosis of intestine, part unspecified, without perforation or abscess without bleeding: Secondary | ICD-10-CM

## 2018-10-31 DIAGNOSIS — M79609 Pain in unspecified limb: Secondary | ICD-10-CM

## 2018-10-31 DIAGNOSIS — M1990 Unspecified osteoarthritis, unspecified site: Secondary | ICD-10-CM

## 2018-10-31 DIAGNOSIS — Z9181 History of falling: Secondary | ICD-10-CM

## 2018-10-31 HISTORY — DX: Fever, unspecified: R50.9

## 2018-10-31 LAB — BASIC METABOLIC PANEL
Anion gap: 14 (ref 5–15)
BUN: 13 mg/dL (ref 8–23)
CO2: 21 mmol/L — ABNORMAL LOW (ref 22–32)
Calcium: 8.6 mg/dL — ABNORMAL LOW (ref 8.9–10.3)
Chloride: 94 mmol/L — ABNORMAL LOW (ref 98–111)
Creatinine, Ser: 0.74 mg/dL (ref 0.44–1.00)
GFR calc Af Amer: >60 mL/min (ref >60)
GFR calc non Af Amer: >60 mL/min (ref >60)
Glucose, Bld: 104 mg/dL — ABNORMAL HIGH (ref 70–99)
Potassium: 3.3 mmol/L — ABNORMAL LOW (ref 3.5–5.1)
Sodium: 129 mmol/L — ABNORMAL LOW (ref 135–145)

## 2018-10-31 LAB — CBC
HCT: 27.9 % — ABNORMAL LOW (ref 36.0–46.0)
Hemoglobin: 9.5 g/dL — ABNORMAL LOW (ref 12.0–15.0)
MCH: 31.9 pg (ref 26.0–34.0)
MCHC: 34.1 g/dL (ref 30.0–36.0)
MCV: 93.6 fL (ref 80.0–100.0)
Platelets: 211 10*3/uL (ref 150–400)
RBC: 2.98 MIL/uL — ABNORMAL LOW (ref 3.87–5.11)
RDW: 13.1 % (ref 11.5–15.5)
WBC: 8 10*3/uL (ref 4.0–10.5)
nRBC: 0 % (ref 0.0–0.2)

## 2018-10-31 LAB — URIC ACID: Uric Acid, Serum: 3.8 mg/dL (ref 2.5–7.1)

## 2018-10-31 LAB — SEDIMENTATION RATE: Sed Rate: 114 mm/hr — ABNORMAL HIGH (ref 0–22)

## 2018-10-31 LAB — C-REACTIVE PROTEIN: CRP: 19.6 mg/dL — ABNORMAL HIGH (ref <1.0)

## 2018-10-31 LAB — HIV ANTIBODY (ROUTINE TESTING W REFLEX): HIV Screen 4th Generation wRfx: NONREACTIVE

## 2018-10-31 LAB — HEPATITIS B SURFACE ANTIGEN: Hepatitis B Surface Ag: NONREACTIVE

## 2018-10-31 MED ORDER — GADOBUTROL 1 MMOL/ML IV SOLN
7.0000 mL | Freq: Once | INTRAVENOUS | Status: AC | PRN
  Administered 2018-10-31: 7 mL via INTRAVENOUS

## 2018-10-31 MED ORDER — POTASSIUM CHLORIDE CRYS ER 20 MEQ PO TBCR
40.0000 meq | EXTENDED_RELEASE_TABLET | Freq: Once | ORAL | Status: AC
  Administered 2018-10-31: 40 meq via ORAL
  Filled 2018-10-31: qty 2

## 2018-10-31 MED ORDER — HYDRALAZINE HCL 10 MG PO TABS: 10.0000 mg | ORAL_TABLET | Freq: Four times a day (QID) | ORAL | Status: DC | PRN

## 2018-10-31 MED ORDER — TAMSULOSIN HCL 0.4 MG PO CAPS
0.4000 mg | ORAL_CAPSULE | Freq: Every day | ORAL | Status: DC
  Administered 2018-10-31 – 2018-11-06 (×7): 0.4 mg via ORAL
  Filled 2018-10-31 (×7): qty 1

## 2018-10-31 NOTE — Progress Notes (Signed)
LE venous duplex       has been completed. Preliminary results can be found under CV proc through chart review. Caitlin Chavez, BS, RDMS, RVT   

## 2018-10-31 NOTE — Progress Notes (Signed)
PROGRESS NOTE    Caitlin Chavez  [id number] DOB: 10/24/1934 DOA: 10/28/2018    Brief Narrative:  Patient is 84 year old history hypertension, hyponatremia, diverticulosis, GERD hiatal hernia presented to the emergency department on 9/29 with chief complaint of generalized weakness.  Patient has a closed fracture of the right ankle and injury to the left ankle. Family reported that the patient was unwilling/unable to get out of bed for the last 3 weeks.  There is also a history of decreased oral intake, increased somnolence, intermittent nausea and confusion.  Assessment & Plan:   Principal Problem:   Failure to thrive in adult Active Problems:   Essential tremor   Falls   Hiatal hernia   Hyponatremia   Generalized weakness   Essential hypertension   Hypoalbuminemia   Anxiety   Ankle fracture, right  Generalized weakness/failure to thrive.   History of fall and right ankle fracture with diminished mobility and oral intake.  Patient has been seen by physical therapy who recommended skilled nursing facility placement.  Pending clinical improvement.  Patient has been encouraged to improve her oral intake.  Systemic inflammatory response syndrome on presentation.  T-max of 102. 9 F. Chest x-ray revealed a hiatal hernia with atelectasis.  COVID-19 screening was negative.  Urinalysis negative.  Urine culture is negative so far.  Patient has been having intermittent episodes of urinary retention.  CT of the abdomen/pelvis Large sliding-type hiatal hernia, small fat containing periumbilical hernia. No acute abnormality seen in the abdomen or pelvis.  Blood cultures repeated on 9/30 is negative so far..  Patient is currently on Zosyn .  I spoke with Dr. Lucianne Lei infectious disease for consultation due to persistent fever despite obvious localizing signs.  X-ray of the left ankle showed evidence of previous fracture.  Check ultrasound of the lower extremity for DVT-pending.  Mild hypokalemia.  Will  replenish.  Check BMP in a.m.  Intermittent urinary retention requiring multiple in and out caths.  Will consider Foley catheter today.  Add Flomax for now.  Patient is not constipated at this time.  Increase ambulation.  Bilateral ankle pain.  X-ray of the left ankle shows a previous fracture  Acute on chronic hyponatremia..  Sodium 129 on admission.    Urine osmolality is more than 800 while serum osmolality is 270.  Sodium of 129 today.  Will closely monitor.  Will consider fluid restriction and salt tablets if necessary.  Acute metabolic encephalopathy.  Improved.  TSH and ammonia within normal limits.  Will closely monitor.  Essential hypertension  Home medications include Coreg hydrochlorothiazide benazepril. Continue Coreg.  Continue to hold benazepril and hydrochlorothiazide.  Would discontinue hydrochlorothiazide on discharge due to hyponatremia.  Add PRN antihypertensive.  Hiatal hernia.  Patient noted to have moderately large hiatal hernia on chest x-ray. Asymptomatic at this time.  Continue PPI.  Essential tremor.  Chart review indicates recent neurology visit.  Continue primidone from home.  Constipation.  Resolved with bowel regimen.  DVT prophylaxis: lovenox  Code Status: full  Family Communication:   I spoke with the patient' daughter Ms Lynelle Smoke [phone] and updated her about the clinical condition.    Disposition Plan:  Skilled nursing facility placement as per PT evaluation.  Continue to monitor for fever.  Will consult ID for further opinion on her fever.  Consultants:   Infectious disease  Procedures:  None  Antimicrobials:  Zosyn>>>  Subjective: Today, patient complains of mild cough but no dyspnea no chest pain.  She is been having  episodes of intermittent urinary retention with difficulty voiding.  Spoke with the nursing staff regarding the need for possible foley catheterization.  Objective: Vitals:   10/30/18 2048 10/30/18 2253 10/31/18 0309  10/31/18 0823  BP: (!) 154/75  (!) 161/89 (!) 153/92  Pulse: 97  78 86  Resp: 16  18 18   Temp: (!) 102.9 F (39.4 C) 99.8 F (37.7 C) 98.2 F (36.8 C) 98.1 F (36.7 C)  TempSrc: Oral Oral Oral Oral  SpO2: 97%  98% 97%  Weight:      Height:        Intake/Output Summary (Last 24 hours) at 10/31/2018 [id number] Last data filed at 10/31/2018 0309 Gross per 24 hour  Intake 240 ml  Output 1245 ml  Net -1005 ml   Filed Weights   10/28/18 0314  Weight: 77.1 kg    Physical examination: General:  Average built, not in obvious distress, febrile HENT: Normocephalic, pupils equally reacting to light and accommodation.  No scleral pallor or icterus noted. Oral mucosa is moist.  Chest:  Clear breath sounds.  Diminished breath sounds bilaterally. No crackles or wheezes.  CVS: S1 &S2 heard. No murmur.  Regular rate and rhythm. Abdomen: Soft, nontender, nondistended.  Bowel sounds are heard.  Liver is not palpable, no abdominal mass palpated Extremities: No cyanosis, clubbing or edema.  Peripheral pulses are palpable.  Right ankle with brace,  Left ankle edema Psych: Alert, awake and communicative,  CNS:  No cranial nerve deficits.  Power equal in all extremities.  No sensory deficits noted.  No cerebellar signs.   Skin: Warm and dry.  No rashes noted.   Data Reviewed: I have personally reviewed following labs and imaging studies  CBC: Recent Labs  Lab 10/28/18 0248 10/29/18 0211 10/31/18 0328  WBC 10.7* 9.9 8.0  NEUTROABS 8.2*  --   --   HGB 10.4* 10.0* 9.5*  HCT 29.5* 28.1* 27.9*  MCV 94.6 93.4 93.6  PLT 288 260 [id number]   Basic Metabolic Panel: Recent Labs  Lab 10/28/18 0248 10/29/18 0211 10/29/18 0744 10/30/18 0246 10/31/18 0328  NA 129* 127* 131* 131* 129*  K 3.5 3.6 3.8 3.4* 3.3*  CL 92* 96* 96* 95* 94*  CO2 23 21* 20* 22 21*  GLUCOSE 116* 113* 100* 110* 104*  BUN 19 16 17 15 13   CREATININE 1.06* 0.85 0.84 0.75 0.74  CALCIUM 9.1 8.3* 8.4* 8.9 8.6*   GFR: Estimated  Creatinine Clearance: 55.6 mL/min (by C-G formula based on SCr of 0.74 mg/dL). Liver Function Tests: Recent Labs  Lab 10/28/18 0248  AST 41  ALT 53*  ALKPHOS 74  BILITOT 0.7  PROT 6.1*  ALBUMIN 2.6*   No results for input(s): LIPASE, AMYLASE in the last 168 hours. Recent Labs  Lab 10/28/18 2210  AMMONIA 21   Coagulation Profile: No results for input(s): INR, PROTIME in the last 168 hours. Cardiac Enzymes: Recent Labs  Lab 10/29/18 1143  CKTOTAL 42   BNP (last 3 results) No results for input(s): PROBNP in the last 8760 hours. HbA1C: No results for input(s): HGBA1C in the last 72 hours. CBG: No results for input(s): GLUCAP in the last 168 hours. Lipid Profile: No results for input(s): CHOL, HDL, LDLCALC, TRIG, CHOLHDL, LDLDIRECT in the last 72 hours. Thyroid Function Tests: Recent Labs    10/28/18 2210  TSH 1.124   Anemia Panel: No results for input(s): VITAMINB12, FOLATE, FERRITIN, TIBC, IRON, RETICCTPCT in the last 72 hours. Urine analysis:    Component  Value Date/Time   COLORURINE YELLOW 10/28/2018 0451   APPEARANCEUR CLEAR 10/28/2018 0451   LABSPEC 1.021 10/28/2018 0451   PHURINE 5.0 10/28/2018 0451   GLUCOSEU NEGATIVE 10/28/2018 0451   HGBUR NEGATIVE 10/28/2018 0451   BILIRUBINUR NEGATIVE 10/28/2018 0451   KETONESUR NEGATIVE 10/28/2018 0451   PROTEINUR NEGATIVE 10/28/2018 0451   NITRITE NEGATIVE 10/28/2018 0451   LEUKOCYTESUR NEGATIVE 10/28/2018 0451   Sepsis Labs: @LABRCNTIP (procalcitonin:4,lacticidven:4)  ) Recent Results (from the past 240 hour(s))  Urine culture     Status: None   Collection Time: 10/28/18  2:38 AM   Specimen: In/Out Cath Urine  Result Value Ref Range Status   Specimen Description IN/OUT CATH URINE  Final   Special Requests NONE  Final   Culture   Final    NO GROWTH Performed at [facility], [place] [address] [place], [place] [address]    Report Status 10/29/2018 FINAL  Final  SARS Coronavirus 2 [facility] order,  Performed in [facility] hospital lab) Nasopharyngeal Nasopharyngeal Swab     Status: None   Collection Time: 10/28/18  2:48 AM   Specimen: Nasopharyngeal Swab  Result Value Ref Range Status   SARS Coronavirus 2 NEGATIVE NEGATIVE Final    Comment: (NOTE) If result is NEGATIVE SARS-CoV-2 target nucleic acids are NOT DETECTED. The SARS-CoV-2 RNA is generally detectable in upper and lower  respiratory specimens during the acute phase of infection. The lowest  concentration of SARS-CoV-2 viral copies this assay can detect is 250  copies / mL. A negative result does not preclude SARS-CoV-2 infection  and should not be used as the sole basis for treatment or other  patient management decisions.  A negative result may occur with  improper specimen collection / handling, submission of specimen other  than nasopharyngeal swab, presence of viral mutation(s) within the  areas targeted by this assay, and inadequate number of viral copies  (<250 copies / mL). A negative result must be combined with clinical  observations, patient history, and epidemiological information. If result is POSITIVE SARS-CoV-2 target nucleic acids are DETECTED. The SARS-CoV-2 RNA is generally detectable in upper and lower  respiratory specimens dur ing the acute phase of infection.  Positive  results are indicative of active infection with SARS-CoV-2.  Clinical  correlation with patient history and other diagnostic information is  necessary to determine patient infection status.  Positive results do  not rule out bacterial infection or co-infection with other viruses. If result is PRESUMPTIVE POSTIVE SARS-CoV-2 nucleic acids MAY BE PRESENT.   A presumptive positive result was obtained on the submitted specimen  and confirmed on repeat testing.  While 2019 novel coronavirus  (SARS-CoV-2) nucleic acids may be present in the submitted sample  additional confirmatory testing may be necessary for epidemiological  and / or  clinical management purposes  to differentiate between  SARS-CoV-2 and other Sarbecovirus currently known to infect humans.  If clinically indicated additional testing with an alternate test  methodology [phone]) is advised. The SARS-CoV-2 RNA is generally  detectable in upper and lower respiratory sp ecimens during the acute  phase of infection. The expected result is Negative. Fact Sheet for Patients:  StrictlyIdeas.no Fact Sheet for Healthcare Providers: BankingDealers.co.za This test is not yet approved or cleared by the [place] FDA and has been authorized for detection and/or diagnosis of SARS-CoV-2 by FDA under an Emergency Use Authorization (EUA).  This EUA will remain in effect (meaning this test can be used) for the duration of the  COVID-19 declaration under Section 564(b)(1) of the Act, 21 U.S.C. section 360bbb-3(b)(1), unless the authorization is terminated or revoked sooner. Performed at [facility], [place] [address] [place], [place] [address]   Culture, blood (routine x 2)     Status: None (Preliminary result)   Collection Time: 10/28/18  2:51 AM   Specimen: BLOOD  Result Value Ref Range Status   Specimen Description BLOOD RIGHT ARM  Final   Special Requests   Final    BOTTLES DRAWN AEROBIC AND ANAEROBIC Blood Culture results may not be optimal due to an excessive volume of blood received in culture bottles   Culture   Final    NO GROWTH 3 DAYS Performed at [facility], [place] [address] [place], [place] [address]    Report Status PENDING  Incomplete  Culture, blood (routine x 2)     Status: None (Preliminary result)   Collection Time: 10/28/18  3:51 AM   Specimen: BLOOD  Result Value Ref Range Status   Specimen Description BLOOD RIGHT HAND  Final   Special Requests   Final    BOTTLES DRAWN AEROBIC AND ANAEROBIC Blood Culture adequate volume   Culture   Final    NO GROWTH 3 DAYS Performed at [facility], [address] [place], [place] [address]    Report Status PENDING  Incomplete  Culture, blood (routine x 2)     Status: None (Preliminary result)   Collection Time: 10/29/18  6:57 PM   Specimen: BLOOD LEFT HAND  Result Value Ref Range Status   Specimen Description BLOOD LEFT HAND  Final   Special Requests   Final    BOTTLES DRAWN AEROBIC AND ANAEROBIC Blood Culture results may not be optimal due to an inadequate volume of blood received in culture bottles   Culture   Final    NO GROWTH 2 DAYS Performed at [facility], [place] [address], [place], [place] [address]    Report Status PENDING  Incomplete  Culture, blood (routine x 2)     Status: None (Preliminary result)   Collection Time: 10/29/18  7:03 PM   Specimen: BLOOD RIGHT HAND  Result Value Ref Range Status   Specimen Description BLOOD RIGHT HAND  Final   Special Requests   Final    BOTTLES DRAWN AEROBIC AND ANAEROBIC Blood Culture results may not be optimal due to an inadequate volume of blood received in culture bottles   Culture   Final    NO GROWTH 2 DAYS Performed at [facility], [place] [address] [place], [place] [address]    Report Status PENDING  Incomplete  Respiratory Panel by PCR     Status: None   Collection Time: 10/30/18  5:32 PM   Specimen: Nasopharyngeal Swab; Respiratory  Result Value Ref Range Status   Adenovirus NOT DETECTED NOT DETECTED Final   Coronavirus 229E NOT DETECTED NOT DETECTED Final    Comment: (NOTE) The Coronavirus on the Respiratory Panel, DOES NOT test for the novel  Coronavirus (2019 nCoV)    Coronavirus HKU1 NOT DETECTED NOT DETECTED Final   Coronavirus NL63 NOT DETECTED NOT DETECTED Final   Coronavirus OC43 NOT DETECTED NOT DETECTED Final   Metapneumovirus NOT DETECTED NOT DETECTED Final   Rhinovirus / Enterovirus NOT DETECTED NOT DETECTED Final   Influenza A NOT DETECTED NOT DETECTED Final   Influenza B NOT DETECTED NOT DETECTED Final   Parainfluenza Virus  1 NOT DETECTED NOT DETECTED  Final   Parainfluenza Virus 2 NOT DETECTED NOT DETECTED Final   Parainfluenza Virus 3 NOT DETECTED NOT DETECTED Final   Parainfluenza Virus 4 NOT DETECTED NOT DETECTED Final   Respiratory Syncytial Virus NOT DETECTED NOT DETECTED Final   Bordetella pertussis NOT DETECTED NOT DETECTED Final   Chlamydophila pneumoniae NOT DETECTED NOT DETECTED Final   Mycoplasma pneumoniae NOT DETECTED NOT DETECTED Final    Comment: Performed at [facility], [place] [address], [place], [place] [address]       Radiology Studies: Dg Ankle 2 Views Right  Result Date: 10/30/2018 CLINICAL DATA:  Fall, ankle pain EXAM: RIGHT ANKLE - 2 VIEW COMPARISON:  None. FINDINGS: Irregularity and small bone fragments noted at the tip of the medial malleolus concerning for avulsion fracture. No fibular abnormality. Ankle mortise is intact with joint space maintained. Diffuse soft tissue swelling. IMPRESSION: Irregularity and small bone fragments at the tip of the medial malleolus compatible with avulsion fractures. Electronically Signed   By: Rolm Baptise M.D.   On: 10/30/2018 12:02    Scheduled Meds: . allopurinol  100 mg Oral BID  . bisacodyl  10 mg Rectal Once  . carvedilol  6.25 mg Oral BID WC  . feeding supplement (ENSURE ENLIVE)  237 mL Oral BID BM  . gabapentin  300 mg Oral BID  . influenza vaccine adjuvanted  0.5 mL Intramuscular Tomorrow-1000  . pantoprazole  40 mg Oral Daily  . primidone  150 mg Oral q morning - 10a   And  . primidone  100 mg Oral QHS  . sodium chloride flush  3 mL Intravenous Q12H   Continuous Infusions:    LOS: 2 days    Ellene Bloodsaw,MD [facility] 10/31/2018, 9:07 AM

## 2018-10-31 NOTE — Consult Note (Addendum)
Date of Admission:  10/28/2018          Reason for Consult:FUO    Referring Provider: Dr Grier Rocher   Assessment:  1. FUO 2. Bilateral ankle pain with history of a fracture on the right side that was evaluated by podiatry in late August 3. Fall 4. Hx of Gout 5. Hx of Diverticulosis 6. Delirium ? dementia  Plan:  1. DC antibiotics 2. MRI bilateral ankles 3. Check TTE 4. Check ESR, CRP, HIV, hepatitis panel, RA, Uric acid 5. Will consider CT chest if above is unrevealing   Dr. Megan Salon to check on her over the weekend.   Principal Problem:   FUO (fever of unknown origin) Active Problems:   Essential tremor   Falls   Hiatal hernia   Hyponatremia   Generalized weakness   Essential hypertension   Hypoalbuminemia   Anxiety   Failure to thrive in adult   Ankle fracture, right   Acute bilateral ankle pain   Scheduled Meds: . allopurinol  100 mg Oral BID  . carvedilol  6.25 mg Oral BID WC  . feeding supplement (ENSURE ENLIVE)  237 mL Oral BID BM  . gabapentin  300 mg Oral BID  . influenza vaccine adjuvanted  0.5 mL Intramuscular Tomorrow-1000  . pantoprazole  40 mg Oral Daily  . primidone  150 mg Oral q morning - 10a   And  . primidone  100 mg Oral QHS  . sodium chloride flush  3 mL Intravenous Q12H  . tamsulosin  0.4 mg Oral Daily   Continuous Infusions: PRN Meds:.acetaminophen **OR** acetaminophen, albuterol, ALPRAZolam, hydrALAZINE, ondansetron **OR** ondansetron (ZOFRAN) IV, traMADol  HPI: Caitlin Chavez is a 84 y.o. female   with a past medical history significant for diverticulosis, hyponatremia osteoarthritis, had developed right ankle pain was evaluated by [facility] podiatry in late August and found to have a closed fracture there.  In the last several weeks apparently she is hardly gotten out of bed and apparently had sustained a fall.  She was admitted to the hospital with fever and weakness.  Upon questioning her she says the reason she came in  was because of her ankle pain and she in fact has bilateral ankle pain.  She was initiated on antimicrobial therapy in the form of Zosyn though no clear-cut infection has been identified.  Her COVID test was negative as was a respiratory panel.  Chest x-ray was without any evidence of inflow infection.  She also had a CT of the abdomen pelvis that did not show infection being present.  Her blood cultures remain sterile as has her urine   She continues to have fevers on a daily basis.  Based on her chief physical complaint which is bilateral ankle pain I think that we need to investigate the ankles for potential infection.  She does also have a history of having had gout in the past.  I am ordering MRI of her ankles today and I am discontinue her Zosyn.  I will check a sed rate CRP rheumatoid factor uric acid HIV and hepatitis panel.  Also order a 2D echocardiogram.  If the work-up above is unrevealing I will order CT of the chest and we can embark on further serological workup.    Review of Systems: Review of Systems  Unable to perform ROS: Dementia    Past Medical History:  Diagnosis Date  . Anxiety   . Arthritis    hands  . Carpal tunnel  syndrome   . Colon polyps   . Diverticulosis   . FUO (fever of unknown origin) 10/31/2018  . GERD (gastroesophageal reflux disease)   . Hiatal hernia   . Hypertension   . Macular degeneration   . Peripheral neuropathy     Social History   Tobacco Use  . Smoking status: Never Smoker  . Smokeless tobacco: Never Used  Substance Use Topics  . Alcohol use: No  . Drug use: No    Family History  Problem Relation Age of Onset  . Heart attack Father 70  . Alcohol abuse Father   . Heart failure Mother 25       chf  . Heart disease Mother   . Stomach cancer Maternal Grandmother   . Prostate cancer Son   . Colon cancer Neg Hx    Allergies  Allergen Reactions  . Alka-Seltzer [Aspirin Effervescent] Anaphylaxis and Hives  .  Beef-Derived Products Anaphylaxis  . Nsaids Anaphylaxis and Hives  . Other Anaphylaxis and Rash  . Pork-Derived Products Anaphylaxis  . Codeine Hives and Nausea And Vomiting    OBJECTIVE: Blood pressure (!) 153/92, pulse 86, temperature 98.1 F (36.7 C), temperature source Oral, resp. rate 18, height '5\' 5"'$  (1.651 m), weight 77.1 kg, SpO2 97 %.  Physical Exam Constitutional:      General: She is not in acute distress.    Appearance: She is well-developed. She is not diaphoretic.  HENT:     Head: Normocephalic and atraumatic.     Mouth/Throat:     Pharynx: No oropharyngeal exudate.  Eyes:     General: No scleral icterus.    Conjunctiva/sclera: Conjunctivae normal.  Neck:     Musculoskeletal: Normal range of motion and neck supple.  Cardiovascular:     Rate and Rhythm: Normal rate and regular rhythm.     Heart sounds: No murmur. No friction rub. No gallop.   Pulmonary:     Effort: Pulmonary effort is normal. No respiratory distress.     Breath sounds: Normal breath sounds. No stridor. No wheezing or rhonchi.  Abdominal:     General: Bowel sounds are normal. There is no distension.     Palpations: There is no mass.     Tenderness: There is no abdominal tenderness.  Musculoskeletal:     Right ankle: She exhibits decreased range of motion and swelling. Tenderness. Lateral malleolus and medial malleolus tenderness found.     Left ankle: Tenderness. Lateral malleolus and medial malleolus tenderness found.  Skin:    General: Skin is warm and dry.     Coloration: Skin is pale.     Findings: No erythema or rash.  Neurological:     Mental Status: She is alert. She is confused.     Motor: No abnormal muscle tone.     Coordination: Coordination normal.  Psychiatric:        Behavior: Behavior normal.     Lab Results Lab Results  Component Value Date   WBC 8.0 10/31/2018   HGB 9.5 (L) 10/31/2018   HCT 27.9 (L) 10/31/2018   MCV 93.6 10/31/2018   PLT 211 10/31/2018    Lab  Results  Component Value Date   CREATININE 0.74 10/31/2018   BUN 13 10/31/2018   NA 129 (L) 10/31/2018   K 3.3 (L) 10/31/2018   CL 94 (L) 10/31/2018   CO2 21 (L) 10/31/2018    Lab Results  Component Value Date   ALT 53 (H) 10/28/2018   AST 41  10/28/2018   ALKPHOS 74 10/28/2018   BILITOT 0.7 10/28/2018     Microbiology: Recent Results (from the past 240 hour(s))  Urine culture     Status: None   Collection Time: 10/28/18  2:38 AM   Specimen: In/Out Cath Urine  Result Value Ref Range Status   Specimen Description IN/OUT CATH URINE  Final   Special Requests NONE  Final   Culture   Final    NO GROWTH Performed at [facility], [place] [address] [place], [place] [address]    Report Status 10/29/2018 FINAL  Final  SARS Coronavirus 2 [facility] order, Performed in [facility] hospital lab) Nasopharyngeal Nasopharyngeal Swab     Status: None   Collection Time: 10/28/18  2:48 AM   Specimen: Nasopharyngeal Swab  Result Value Ref Range Status   SARS Coronavirus 2 NEGATIVE NEGATIVE Final    Comment: (NOTE) If result is NEGATIVE SARS-CoV-2 target nucleic acids are NOT DETECTED. The SARS-CoV-2 RNA is generally detectable in upper and lower  respiratory specimens during the acute phase of infection. The lowest  concentration of SARS-CoV-2 viral copies this assay can detect is 250  copies / mL. A negative result does not preclude SARS-CoV-2 infection  and should not be used as the sole basis for treatment or other  patient management decisions.  A negative result may occur with  improper specimen collection / handling, submission of specimen other  than nasopharyngeal swab, presence of viral mutation(s) within the  areas targeted by this assay, and inadequate number of viral copies  (<250 copies / mL). A negative result must be combined with clinical  observations, patient history, and epidemiological information. If result is POSITIVE SARS-CoV-2 target nucleic acids are  DETECTED. The SARS-CoV-2 RNA is generally detectable in upper and lower  respiratory specimens dur ing the acute phase of infection.  Positive  results are indicative of active infection with SARS-CoV-2.  Clinical  correlation with patient history and other diagnostic information is  necessary to determine patient infection status.  Positive results do  not rule out bacterial infection or co-infection with other viruses. If result is PRESUMPTIVE POSTIVE SARS-CoV-2 nucleic acids MAY BE PRESENT.   A presumptive positive result was obtained on the submitted specimen  and confirmed on repeat testing.  While 2019 novel coronavirus  (SARS-CoV-2) nucleic acids may be present in the submitted sample  additional confirmatory testing may be necessary for epidemiological  and / or clinical management purposes  to differentiate between  SARS-CoV-2 and other Sarbecovirus currently known to infect humans.  If clinically indicated additional testing with an alternate test  methodology [phone]) is advised. The SARS-CoV-2 RNA is generally  detectable in upper and lower respiratory sp ecimens during the acute  phase of infection. The expected result is Negative. Fact Sheet for Patients:  StrictlyIdeas.no Fact Sheet for Healthcare Providers: BankingDealers.co.za This test is not yet approved or cleared by the [place] FDA and has been authorized for detection and/or diagnosis of SARS-CoV-2 by FDA under an Emergency Use Authorization (EUA).  This EUA will remain in effect (meaning this test can be used) for the duration of the COVID-19 declaration under Section 564(b)(1) of the Act, 21 U.S.C. section 360bbb-3(b)(1), unless the authorization is terminated or revoked sooner. Performed at [facility], [place] [address] [place],  [address]   Culture, blood (routine x 2)     Status: None (Preliminary result)   Collection Time: 10/28/18  2:51  AM   Specimen:  BLOOD  Result Value Ref Range Status   Specimen Description BLOOD RIGHT ARM  Final   Special Requests   Final    BOTTLES DRAWN AEROBIC AND ANAEROBIC Blood Culture results may not be optimal due to an excessive volume of blood received in culture bottles   Culture   Final    NO GROWTH 3 DAYS Performed at [facility], [place] [address], [place], [place] [address]    Report Status PENDING  Incomplete  Culture, blood (routine x 2)     Status: None (Preliminary result)   Collection Time: 10/28/18  3:51 AM   Specimen: BLOOD  Result Value Ref Range Status   Specimen Description BLOOD RIGHT HAND  Final   Special Requests   Final    BOTTLES DRAWN AEROBIC AND ANAEROBIC Blood Culture adequate volume   Culture   Final    NO GROWTH 3 DAYS Performed at [facility], [address] [place], [place] [address]    Report Status PENDING  Incomplete  Culture, blood (routine x 2)     Status: None (Preliminary result)   Collection Time: 10/29/18  6:57 PM   Specimen: BLOOD LEFT HAND  Result Value Ref Range Status   Specimen Description BLOOD LEFT HAND  Final   Special Requests   Final    BOTTLES DRAWN AEROBIC AND ANAEROBIC Blood Culture results may not be optimal due to an inadequate volume of blood received in culture bottles   Culture   Final    NO GROWTH 2 DAYS Performed at [facility], [place] [address] [place], [place] [address]    Report Status PENDING  Incomplete  Culture, blood (routine x 2)     Status: None (Preliminary result)   Collection Time: 10/29/18  7:03 PM   Specimen: BLOOD RIGHT HAND  Result Value Ref Range Status   Specimen Description BLOOD RIGHT HAND  Final   Special Requests   Final    BOTTLES DRAWN AEROBIC AND ANAEROBIC Blood Culture results may not be optimal due to an inadequate volume of blood received in culture bottles   Culture   Final    NO GROWTH 2 DAYS Performed at [facility], [place] [address] [place], [place] [address]     Report Status PENDING  Incomplete  Respiratory Panel by PCR     Status: None   Collection Time: 10/30/18  5:32 PM   Specimen: Nasopharyngeal Swab; Respiratory  Result Value Ref Range Status   Adenovirus NOT DETECTED NOT DETECTED Final   Coronavirus 229E NOT DETECTED NOT DETECTED Final    Comment: (NOTE) The Coronavirus on the Respiratory Panel, DOES NOT test for the novel  Coronavirus (2019 nCoV)    Coronavirus HKU1 NOT DETECTED NOT DETECTED Final   Coronavirus NL63 NOT DETECTED NOT DETECTED Final   Coronavirus OC43 NOT DETECTED NOT DETECTED Final   Metapneumovirus NOT DETECTED NOT DETECTED Final   Rhinovirus / Enterovirus NOT DETECTED NOT DETECTED Final   Influenza A NOT DETECTED NOT DETECTED Final   Influenza B NOT DETECTED NOT DETECTED Final   Parainfluenza Virus 1 NOT DETECTED NOT DETECTED Final   Parainfluenza Virus 2 NOT DETECTED NOT DETECTED Final   Parainfluenza Virus 3 NOT DETECTED NOT DETECTED Final   Parainfluenza Virus 4 NOT DETECTED NOT DETECTED Final   Respiratory Syncytial Virus NOT DETECTED NOT DETECTED Final   Bordetella pertussis NOT DETECTED NOT DETECTED Final   Chlamydophila pneumoniae NOT DETECTED NOT DETECTED Final  Mycoplasma pneumoniae NOT DETECTED NOT DETECTED Final    Comment: Performed at [facility], [place] [address] [place], [place] [address]    Alcide Evener, [place] [facility] [phone] pager  10/31/2018, 1:02 PM

## 2018-10-31 NOTE — Plan of Care (Signed)
  Problem: Elimination: Goal: Will not experience complications related to bowel motility Outcome: Progressing   Problem: Activity: Goal: Risk for activity intolerance will decrease Outcome: Progressing

## 2018-10-31 NOTE — TOC Progression Note (Signed)
Transition of Care [facility]) - Progression Note    Patient Details  Name: Caitlin Chavez MRN: [id number] Date of Birth: 12/20/1934  Transition of Care [facility]) CM/SW [place] RN, BSN, NCM-BC, [place] [phone] Phone Number: 10/31/2018, 1:09 PM  Clinical Narrative:    CM continue following for dispositional needs. FL2 sent to [facility] with bed offers obtained. CM spoke to patients daughter, Rudy Mcclenny, to discuss ST SNF process and bed offers. CMS HH Compare list emailed to patients daughter to review and discuss with her family as requested. CM team will f/u on bed choice. Patient will need insurance auth once facility is selected and a recent COVID test. CM will continue to follow.    Expected Discharge Plan: [place] Barriers to Discharge: Continued Medical Work up  Expected Discharge Plan and Services Expected Discharge Plan: [place] In-house Referral: Clinical Social Work   Post Acute Care Choice: Skilled Nursing Facility(if recommended by PT) Living arrangements for the past 2 months: Single Family Home                   Social Determinants of Health (SDOH) Interventions    Readmission Risk Interventions No flowsheet data found.

## 2018-11-01 ENCOUNTER — Inpatient Hospital Stay (HOSPITAL_COMMUNITY): Payer: Medicare Other

## 2018-11-01 DIAGNOSIS — I361 Nonrheumatic tricuspid (valve) insufficiency: Secondary | ICD-10-CM

## 2018-11-01 DIAGNOSIS — S82891A Other fracture of right lower leg, initial encounter for closed fracture: Secondary | ICD-10-CM

## 2018-11-01 DIAGNOSIS — Z886 Allergy status to analgesic agent status: Secondary | ICD-10-CM

## 2018-11-01 DIAGNOSIS — Z8739 Personal history of other diseases of the musculoskeletal system and connective tissue: Secondary | ICD-10-CM

## 2018-11-01 DIAGNOSIS — X58XXXA Exposure to other specified factors, initial encounter: Secondary | ICD-10-CM

## 2018-11-01 DIAGNOSIS — Z91018 Allergy to other foods: Secondary | ICD-10-CM

## 2018-11-01 DIAGNOSIS — A689 Relapsing fever, unspecified: Secondary | ICD-10-CM

## 2018-11-01 DIAGNOSIS — M25571 Pain in right ankle and joints of right foot: Principal | ICD-10-CM

## 2018-11-01 DIAGNOSIS — M109 Gout, unspecified: Secondary | ICD-10-CM

## 2018-11-01 DIAGNOSIS — Z888 Allergy status to other drugs, medicaments and biological substances status: Secondary | ICD-10-CM

## 2018-11-01 DIAGNOSIS — Z885 Allergy status to narcotic agent status: Secondary | ICD-10-CM

## 2018-11-01 LAB — CBC
HCT: 25.3 % — ABNORMAL LOW (ref 36.0–46.0)
Hemoglobin: 8.9 g/dL — ABNORMAL LOW (ref 12.0–15.0)
MCH: 32.6 pg (ref 26.0–34.0)
MCHC: 35.2 g/dL (ref 30.0–36.0)
MCV: 92.7 fL (ref 80.0–100.0)
Platelets: 219 10*3/uL (ref 150–400)
RBC: 2.73 MIL/uL — ABNORMAL LOW (ref 3.87–5.11)
RDW: 13.2 % (ref 11.5–15.5)
WBC: 8.6 10*3/uL (ref 4.0–10.5)
nRBC: 0 % (ref 0.0–0.2)

## 2018-11-01 LAB — BASIC METABOLIC PANEL
Anion gap: 11 (ref 5–15)
BUN: 12 mg/dL (ref 8–23)
CO2: 23 mmol/L (ref 22–32)
Calcium: 8.5 mg/dL — ABNORMAL LOW (ref 8.9–10.3)
Chloride: 96 mmol/L — ABNORMAL LOW (ref 98–111)
Creatinine, Ser: 0.64 mg/dL (ref 0.44–1.00)
GFR calc Af Amer: >60 mL/min (ref >60)
GFR calc non Af Amer: >60 mL/min (ref >60)
Glucose, Bld: 94 mg/dL (ref 70–99)
Potassium: 3.6 mmol/L (ref 3.5–5.1)
Sodium: 130 mmol/L — ABNORMAL LOW (ref 135–145)

## 2018-11-01 LAB — MAGNESIUM: Magnesium: 1.5 mg/dL — ABNORMAL LOW (ref 1.7–2.4)

## 2018-11-01 LAB — ECHOCARDIOGRAM COMPLETE
Height: 65 in
Weight: 2720 oz

## 2018-11-01 LAB — HCV COMMENT:

## 2018-11-01 LAB — HEPATITIS C ANTIBODY (REFLEX): HCV Ab: 0.1 s/co ratio (ref 0.0–0.9)

## 2018-11-01 MED ORDER — IBUPROFEN 200 MG PO TABS
400.0000 mg | ORAL_TABLET | Freq: Three times a day (TID) | ORAL | Status: DC
  Administered 2018-11-01 – 2018-11-03 (×5): 400 mg via ORAL
  Filled 2018-11-01 (×7): qty 2

## 2018-11-01 MED ORDER — POTASSIUM CHLORIDE CRYS ER 20 MEQ PO TBCR
40.0000 meq | EXTENDED_RELEASE_TABLET | Freq: Once | ORAL | Status: AC
  Administered 2018-11-01: 40 meq via ORAL
  Filled 2018-11-01: qty 2

## 2018-11-01 MED ORDER — IBUPROFEN 200 MG PO TABS: 200.0000 mg | ORAL_TABLET | Freq: Three times a day (TID) | ORAL | Status: DC

## 2018-11-01 MED ORDER — CHLORHEXIDINE GLUCONATE CLOTH 2 % EX PADS
6.0000 | MEDICATED_PAD | Freq: Every day | CUTANEOUS | Status: DC
  Administered 2018-11-01 – 2018-11-06 (×6): 6 via TOPICAL

## 2018-11-01 MED ORDER — MAGNESIUM SULFATE 2 GM/50ML IV SOLN
2.0000 g | Freq: Once | INTRAVENOUS | Status: AC
  Administered 2018-11-01: 15:00:00 2 g via INTRAVENOUS
  Filled 2018-11-01: qty 50

## 2018-11-01 MED ORDER — COLCHICINE 0.6 MG PO TABS
0.6000 mg | ORAL_TABLET | Freq: Three times a day (TID) | ORAL | Status: DC
  Administered 2018-11-01 – 2018-11-02 (×3): 0.6 mg via ORAL
  Filled 2018-11-01 (×3): qty 1

## 2018-11-01 MED ORDER — ACETAMINOPHEN 500 MG PO TABS
500.0000 mg | ORAL_TABLET | Freq: Four times a day (QID) | ORAL | Status: DC
  Administered 2018-11-01 – 2018-11-05 (×19): 500 mg via ORAL
  Filled 2018-11-01 (×18): qty 1

## 2018-11-01 NOTE — Progress Notes (Addendum)
PROGRESS NOTE    Caitlin Chavez  [id number] DOB: August 29, 1934 DOA: 10/28/2018 PCP: Aletha Halim., PA-C    Brief Narrative:  84 year old female who presented with weakness.  She does have significant past medical history for hypertension, hyponatremia, diverticulosis, GERD, hiatal hernia and arthritis.  She reported progressive generalized weakness.  She had a recent mechanical fall with right ankle injury.  Now with ambulatory dysfunction.  Due to progressive symptoms EMS was called, patient was found to be febrile.  On her initial physical examination blood pressure 136/69, heart rate 96, respiratory 21, oxygen saturation 96%, she had dry mucous membranes, lungs clear to auscultation bilaterally, heart S1-S2 present rhythm, abdomen soft nontender, no lower extremity edema.  Patient was admitted to the hospital with a working diagnosis of systemic response syndrome.   Patient continue to be febrile, despite antibiotic therapy. ID was consulted with recommendation to stop antibiotic therapy, further workup with bilateral ankles MRI and echocardiogram.   Assessment & Plan:   Principal Problem:   FUO (fever of unknown origin) Active Problems:   Essential tremor   Falls   Hiatal hernia   Hyponatremia   Generalized weakness   Essential hypertension   Hypoalbuminemia   Anxiety   Failure to thrive in adult   Ankle fracture, right   Acute bilateral ankle pain   1.  Right ankle pain with fever and systemic inflammatory response. Patient with persistent pain and fever,  Imaging with bilateral ankle MRI negative for deep infection, wbc at 8,6 with sed rate 114, crp 19,6. Uric acid 3,8. Patient on allopurinol and hx of gout in the past. Possible gout arthritis. Will add anti-inflammatory regimen with ibuprofen and colchicine. Will follow on response.   2. HTN. Will continue to hold on hctz and benazepril, will continue carvedilol for blood pressure control.  3. Hypokalemia with  Hyponatremia. Na at 130 with K at 3,6 and serum bicarbonate at 23. Mg at 1,5. Preserved renal function with serum cr at 0,64. Continue K and Mg correction, follow on renal pane, in am.   4. Acute metabolic encephalopathy. Patient is more awake and alert, continue pain control and neuro checks per unit protocol.  DVT prophylaxis: enoxaparin   Code Status: full Family Communication: I spoke with patient's daughter at the bedside and all questions were addressed.  Disposition Plan/ discharge barriers: pending clinical improvement.   Body mass index is 28.29 kg/m. Malnutrition Type:      Malnutrition Characteristics:      Nutrition Interventions:     RN Pressure Injury Documentation:     Consultants:   ID   Procedures:     Antimicrobials:       Subjective: Patient with significant pain on the right ankle, pain to light tough and movement, associated with poor appetite and persistent fever. Patient had gout in the past.   Objective: Vitals:   10/31/18 1638 10/31/18 1922 11/01/18 0520 11/01/18 0811  BP: 129/70 (!) 152/75 (!) 156/82 (!) 157/83  Pulse: 96 84 92 90  Resp:  14 20 16   Temp: 98.5 F (36.9 C) 98.2 F (36.8 C) 100.1 F (37.8 C) (!) 100.8 F (38.2 C)  TempSrc: Oral Oral Oral Oral  SpO2: 98% 98% 96% 97%  Weight:      Height:        Intake/Output Summary (Last 24 hours) at 11/01/2018 1346 Last data filed at 11/01/2018 0800 Gross per 24 hour  Intake 340 ml  Output 800 ml  Net -460 ml  Filed Weights   10/28/18 0314  Weight: 77.1 kg    Examination:   General: Not in pain or dyspnea, deconditioned and ill looking appearing.  Neurology: Awake and alert, non focal  E ENT: mild pallor, no icterus, oral mucosa moist Cardiovascular: No JVD. S1-S2 present, rhythmic, no gallops, rubs, or murmurs. No lower extremity edema. Pulmonary: positive breath sounds bilaterally, adequate air movement, no wheezing, rhonchi or rales. Gastrointestinal. Abdomen  with no organomegaly, non tender, no rebound or guarding Skin. No rashes Musculoskeletal: no joint deformities/ right ankle with local edema, no erythema, increase local temperature and tender to light touch.      Data Reviewed: I have personally reviewed following labs and imaging studies  CBC: Recent Labs  Lab 10/28/18 0248 10/29/18 0211 10/31/18 0328 11/01/18 0713  WBC 10.7* 9.9 8.0 8.6  NEUTROABS 8.2*  --   --   --   HGB 10.4* 10.0* 9.5* 8.9*  HCT 29.5* 28.1* 27.9* 25.3*  MCV 94.6 93.4 93.6 92.7  PLT 288 260 211 [id number]   Basic Metabolic Panel: Recent Labs  Lab 10/29/18 0211 10/29/18 0744 10/30/18 0246 10/31/18 0328 11/01/18 0713  NA 127* 131* 131* 129* 130*  K 3.6 3.8 3.4* 3.3* 3.6  CL 96* 96* 95* 94* 96*  CO2 21* 20* 22 21* 23  GLUCOSE 113* 100* 110* 104* 94  BUN 16 17 15 13 12   CREATININE 0.85 0.84 0.75 0.74 0.64  CALCIUM 8.3* 8.4* 8.9 8.6* 8.5*  MG  --   --   --   --  1.5*   GFR: Estimated Creatinine Clearance: 55.6 mL/min (by C-G formula based on SCr of 0.64 mg/dL). Liver Function Tests: Recent Labs  Lab 10/28/18 0248  AST 41  ALT 53*  ALKPHOS 74  BILITOT 0.7  PROT 6.1*  ALBUMIN 2.6*   No results for input(s): LIPASE, AMYLASE in the last 168 hours. Recent Labs  Lab 10/28/18 2210  AMMONIA 21   Coagulation Profile: No results for input(s): INR, PROTIME in the last 168 hours. Cardiac Enzymes: Recent Labs  Lab 10/29/18 1143  CKTOTAL 42   BNP (last 3 results) No results for input(s): PROBNP in the last 8760 hours. HbA1C: No results for input(s): HGBA1C in the last 72 hours. CBG: No results for input(s): GLUCAP in the last 168 hours. Lipid Profile: No results for input(s): CHOL, HDL, LDLCALC, TRIG, CHOLHDL, LDLDIRECT in the last 72 hours. Thyroid Function Tests: No results for input(s): TSH, T4TOTAL, FREET4, T3FREE, THYROIDAB in the last 72 hours. Anemia Panel: No results for input(s): VITAMINB12, FOLATE, FERRITIN, TIBC, IRON, RETICCTPCT  in the last 72 hours.    Radiology Studies: I have reviewed all of the imaging during this hospital visit personally     Scheduled Meds: . allopurinol  100 mg Oral BID  . carvedilol  6.25 mg Oral BID WC  . Chlorhexidine Gluconate Cloth  6 each Topical Daily  . feeding supplement (ENSURE ENLIVE)  237 mL Oral BID BM  . gabapentin  300 mg Oral BID  . influenza vaccine adjuvanted  0.5 mL Intramuscular Tomorrow-1000  . pantoprazole  40 mg Oral Daily  . primidone  150 mg Oral q morning - 10a   And  . primidone  100 mg Oral QHS  . sodium chloride flush  3 mL Intravenous Q12H  . tamsulosin  0.4 mg Oral Daily   Continuous Infusions:   LOS: 3 days        Alois Mincer Gerome Apley, MD

## 2018-11-01 NOTE — Plan of Care (Signed)

## 2018-11-01 NOTE — TOC Progression Note (Addendum)
Transition of Care [facility]) - Progression Note    Patient Details  Name: Caitlin Chavez MRN: [id number] Date of Birth: 1935-01-21  Transition of Care [facility]) CM/SW [place], [place] Phone Number: [phone] 11/01/2018, 3:37 PM  Clinical Narrative:     CSW met with patient and patient's daughter- in- law Tammy to give bed offers. CSW provided them with the bed offers. They inquired about visitations at the facilities due to not pursing a SNF if there was no visitations. Tammy informed CSW that she would call and find out. They informed CSW that they wanted to research the facilities and was not at a point to pick a facility. They asked if CSW team could follow up on Monday.  CSW explained supportively the discharge process planning and the need to have a plan, so when patient is ready for discharge the plan is in place.  TOC team will continue to follow up for discharge planning.    3:58 pm  Tammy followed up with CSW to inform her that she followed up with the facilities and they are not allowing visitors. They want patient to go home with [facility] instead. CSW alerted RN case manager of request.Expected Discharge Plan: [place] Barriers to Discharge: Continued Medical Work up  Expected Discharge Plan and Services Expected Discharge Plan: [place] In-house Referral: Clinical Social Work   Post Acute Care Choice: Skilled Nursing Facility(if recommended by PT) Living arrangements for the past 2 months: Single Family Home                                       Social Determinants of Health (SDOH) Interventions    Readmission Risk Interventions No flowsheet data found.

## 2018-11-01 NOTE — Progress Notes (Signed)
Patient ID: Caitlin Chavez, female   DOB: [birth year], 84 y.o.   MRN: [id number]         [facility]  Date of Admission:  10/28/2018      ASSESSMENT: The source of her recent fevers is unclear.  She does not have any evidence of pneumonia or UTI.  Blood cultures remain negative.  Of her ankles does not reveal any clear-cut evidence of infection.  There are changes compatible with a subacute malleolar fracture in her right ankle.  If she continues to have fever I would recommend orthopedic evaluation.  Even though her uric acid is normal I am still concerned about the possibility of gout superimposed upon her fracture.  PLAN: 1. Continue observation off of antibiotics  Principal Problem:   Fever Active Problems:   Ankle fracture, right   Acute bilateral ankle pain   History of gout   Essential tremor   Falls   Hiatal hernia   Hyponatremia   Generalized weakness   Essential hypertension   Hypoalbuminemia   Anxiety   Failure to thrive in adult   Scheduled Meds: . acetaminophen  500 mg Oral Q6H  . allopurinol  100 mg Oral BID  . carvedilol  6.25 mg Oral BID WC  . Chlorhexidine Gluconate Cloth  6 each Topical Daily  . feeding supplement (ENSURE ENLIVE)  237 mL Oral BID BM  . gabapentin  300 mg Oral BID  . ibuprofen  200 mg Oral TID  . influenza vaccine adjuvanted  0.5 mL Intramuscular Tomorrow-1000  . pantoprazole  40 mg Oral Daily  . primidone  150 mg Oral q morning - 10a   And  . primidone  100 mg Oral QHS  . sodium chloride flush  3 mL Intravenous Q12H  . tamsulosin  0.4 mg Oral Daily   Continuous Infusions: PRN Meds:.acetaminophen **OR** acetaminophen, albuterol, ALPRAZolam, hydrALAZINE, ondansetron **OR** ondansetron (ZOFRAN) IV, traMADol   SUBJECTIVE: She continues to be bothered by bilateral ankle pain, right greater than left.  When I asked her if the pain feels like her previous bouts of gout she said "no, it is much worse".  Review  of Systems: Review of Systems  Constitutional: Positive for fever. Negative for chills and diaphoresis.  HENT: Negative for congestion and sore throat.   Respiratory: Negative for cough and shortness of breath.   Cardiovascular: Negative for chest pain.  Gastrointestinal: Negative for abdominal pain, diarrhea, nausea and vomiting.  Genitourinary: Negative for dysuria.  Musculoskeletal: Positive for joint pain.  Skin: Negative for rash.    Allergies  Allergen Reactions  . Alka-Seltzer [Aspirin Effervescent] Anaphylaxis and Hives  . Beef-Derived Products Anaphylaxis  . Nsaids Anaphylaxis and Hives  . Other Anaphylaxis and Rash  . Pork-Derived Products Anaphylaxis  . Codeine Hives and Nausea And Vomiting    OBJECTIVE: Vitals:   10/31/18 1638 10/31/18 1922 11/01/18 0520 11/01/18 0811  BP: 129/70 (!) 152/75 (!) 156/82 (!) 157/83  Pulse: 96 84 92 90  Resp:  14 20 16   Temp: 98.5 F (36.9 C) 98.2 F (36.8 C) 100.1 F (37.8 C) (!) 100.8 F (38.2 C)  TempSrc: Oral Oral Oral Oral  SpO2: 98% 98% 96% 97%  Weight:      Height:       Body mass index is 28.29 kg/m.  Physical Exam Constitutional:      Comments: She is resting quietly in bed.  Cardiovascular:     Rate and Rhythm: Normal rate and regular  rhythm.     Heart sounds: No murmur.  Pulmonary:     Effort: Pulmonary effort is normal.     Breath sounds: Normal breath sounds.  Abdominal:     Palpations: Abdomen is soft.     Tenderness: There is no abdominal tenderness.  Musculoskeletal:     Comments: She is exquisitely tender with palpation and range of motion of her right ankle.  She has some mild diffuse swelling.  There is no unusual redness.  She has mild tenderness with palpation over the left medial malleolus.  There is no unusual redness.  Skin:    Findings: No rash.     Lab Results Lab Results  Component Value Date   WBC 8.6 11/01/2018   HGB 8.9 (L) 11/01/2018   HCT 25.3 (L) 11/01/2018   MCV 92.7  11/01/2018   PLT 219 11/01/2018    Lab Results  Component Value Date   CREATININE 0.64 11/01/2018   BUN 12 11/01/2018   NA 130 (L) 11/01/2018   K 3.6 11/01/2018   CL 96 (L) 11/01/2018   CO2 23 11/01/2018    Lab Results  Component Value Date   ALT 53 (H) 10/28/2018   AST 41 10/28/2018   ALKPHOS 74 10/28/2018   BILITOT 0.7 10/28/2018     Microbiology: Recent Results (from the past 240 hour(s))  Urine culture     Status: None   Collection Time: 10/28/18  2:38 AM   Specimen: In/Out Cath Urine  Result Value Ref Range Status   Specimen Description IN/OUT CATH URINE  Final   Special Requests NONE  Final   Culture   Final    NO GROWTH Performed at [facility], [address] [place], [place] [address]    Report Status 10/29/2018 FINAL  Final  SARS Coronavirus 2 [facility] order, Performed in [facility] hospital lab) Nasopharyngeal Nasopharyngeal Swab     Status: None   Collection Time: 10/28/18  2:48 AM   Specimen: Nasopharyngeal Swab  Result Value Ref Range Status   SARS Coronavirus 2 NEGATIVE NEGATIVE Final    Comment: (NOTE) If result is NEGATIVE SARS-CoV-2 target nucleic acids are NOT DETECTED. The SARS-CoV-2 RNA is generally detectable in upper and lower  respiratory specimens during the acute phase of infection. The lowest  concentration of SARS-CoV-2 viral copies this assay can detect is 250  copies / mL. A negative result does not preclude SARS-CoV-2 infection  and should not be used as the sole basis for treatment or other  patient management decisions.  A negative result may occur with  improper specimen collection / handling, submission of specimen other  than nasopharyngeal swab, presence of viral mutation(s) within the  areas targeted by this assay, and inadequate number of viral copies  (<250 copies / mL). A negative result must be combined with clinical  observations, patient history, and epidemiological information. If result is POSITIVE SARS-CoV-2  target nucleic acids are DETECTED. The SARS-CoV-2 RNA is generally detectable in upper and lower  respiratory specimens dur ing the acute phase of infection.  Positive  results are indicative of active infection with SARS-CoV-2.  Clinical  correlation with patient history and other diagnostic information is  necessary to determine patient infection status.  Positive results do  not rule out bacterial infection or co-infection with other viruses. If result is PRESUMPTIVE POSTIVE SARS-CoV-2 nucleic acids MAY BE PRESENT.   A presumptive positive result was obtained on the submitted specimen  and confirmed on repeat  testing.  While 2019 novel coronavirus  (SARS-CoV-2) nucleic acids may be present in the submitted sample  additional confirmatory testing may be necessary for epidemiological  and / or clinical management purposes  to differentiate between  SARS-CoV-2 and other Sarbecovirus currently known to infect humans.  If clinically indicated additional testing with an alternate test  methodology [phone]) is advised. The SARS-CoV-2 RNA is generally  detectable in upper and lower respiratory sp ecimens during the acute  phase of infection. The expected result is Negative. Fact Sheet for Patients:  StrictlyIdeas.no Fact Sheet for Healthcare Providers: BankingDealers.co.za This test is not yet approved or cleared by the [place] FDA and has been authorized for detection and/or diagnosis of SARS-CoV-2 by FDA under an Emergency Use Authorization (EUA).  This EUA will remain in effect (meaning this test can be used) for the duration of the COVID-19 declaration under Section 564(b)(1) of the Act, 21 U.S.C. section 360bbb-3(b)(1), unless the authorization is terminated or revoked sooner. Performed at [facility], [place] [address] [place], [place] [address]   Culture, blood (routine x 2)     Status: None (Preliminary result)    Collection Time: 10/28/18  2:51 AM   Specimen: BLOOD  Result Value Ref Range Status   Specimen Description BLOOD RIGHT ARM  Final   Special Requests   Final    BOTTLES DRAWN AEROBIC AND ANAEROBIC Blood Culture results may not be optimal due to an excessive volume of blood received in culture bottles   Culture   Final    NO GROWTH 4 DAYS Performed at [facility], [place] [address] [place], [place] [address]    Report Status PENDING  Incomplete  Culture, blood (routine x 2)     Status: None (Preliminary result)   Collection Time: 10/28/18  3:51 AM   Specimen: BLOOD  Result Value Ref Range Status   Specimen Description BLOOD RIGHT HAND  Final   Special Requests   Final    BOTTLES DRAWN AEROBIC AND ANAEROBIC Blood Culture adequate volume   Culture   Final    NO GROWTH 4 DAYS Performed at [facility], [place] [address], [place], [place] [address]    Report Status PENDING  Incomplete  Culture, blood (routine x 2)     Status: None (Preliminary result)   Collection Time: 10/29/18  6:57 PM   Specimen: BLOOD LEFT HAND  Result Value Ref Range Status   Specimen Description BLOOD LEFT HAND  Final   Special Requests   Final    BOTTLES DRAWN AEROBIC AND ANAEROBIC Blood Culture results may not be optimal due to an inadequate volume of blood received in culture bottles   Culture   Final    NO GROWTH 3 DAYS Performed at [facility], [place] [address] [place], [place] [address]    Report Status PENDING  Incomplete  Culture, blood (routine x 2)     Status: None (Preliminary result)   Collection Time: 10/29/18  7:03 PM   Specimen: BLOOD RIGHT HAND  Result Value Ref Range Status   Specimen Description BLOOD RIGHT HAND  Final   Special Requests   Final    BOTTLES DRAWN AEROBIC AND ANAEROBIC Blood Culture results may not be optimal due to an inadequate volume of blood received in culture bottles   Culture   Final    NO GROWTH 3 DAYS Performed at [facility], [place] [address], [place],  [address]  Report Status PENDING  Incomplete  Respiratory Panel by PCR     Status: None   Collection Time: 10/30/18  5:32 PM   Specimen: Nasopharyngeal Swab; Respiratory  Result Value Ref Range Status   Adenovirus NOT DETECTED NOT DETECTED Final   Coronavirus 229E NOT DETECTED NOT DETECTED Final    Comment: (NOTE) The Coronavirus on the Respiratory Panel, DOES NOT test for the novel  Coronavirus (2019 nCoV)    Coronavirus HKU1 NOT DETECTED NOT DETECTED Final   Coronavirus NL63 NOT DETECTED NOT DETECTED Final   Coronavirus OC43 NOT DETECTED NOT DETECTED Final   Metapneumovirus NOT DETECTED NOT DETECTED Final   Rhinovirus / Enterovirus NOT DETECTED NOT DETECTED Final   Influenza A NOT DETECTED NOT DETECTED Final   Influenza B NOT DETECTED NOT DETECTED Final   Parainfluenza Virus 1 NOT DETECTED NOT DETECTED Final   Parainfluenza Virus 2 NOT DETECTED NOT DETECTED Final   Parainfluenza Virus 3 NOT DETECTED NOT DETECTED Final   Parainfluenza Virus 4 NOT DETECTED NOT DETECTED Final   Respiratory Syncytial Virus NOT DETECTED NOT DETECTED Final   Bordetella pertussis NOT DETECTED NOT DETECTED Final   Chlamydophila pneumoniae NOT DETECTED NOT DETECTED Final   Mycoplasma pneumoniae NOT DETECTED NOT DETECTED Final    Comment: Performed at [facility], [place]. [address] [place], [place] [address]    Michel Bickers, [place] [facility] [phone] pager   [phone] cell 11/01/2018, 1:53 PM

## 2018-11-01 NOTE — Progress Notes (Signed)
*  PRELIMINARY RESULTS* Echocardiogram 2D Echocardiogram has been performed.  Leavy Cella 11/01/2018, 2:56 PM

## 2018-11-01 NOTE — TOC Progression Note (Signed)
Transition of Care [facility]) - Progression Note    Patient Details  Name: Caitlin Chavez MRN: [id number] Date of Birth: [birth year]  [facility]) CM/SW Contact  Zenon Mayo, RN Phone Number: 11/01/2018, 5:08 PM  Clinical Narrative:    NCM wernt to speak with daughter, they want her to go home, NCM offered choice, daughter chose 4 different agencies to try, NCM tried [place] with [place] ,he states he will have to take a look and give [place] a call tomorrow.  The other agencies were closed.  If [facility] can not take her ,she would like to try [facility], [facility], [place].  Informed her the NCM of TOC team will check back with [facility] tomorrow.    Expected Discharge Plan: [place] Barriers to Discharge: Continued Medical Work up  Expected Discharge Plan and Services Expected Discharge Plan: [place] In-house Referral: Clinical Social Work   Post Acute Care Choice: Skilled Nursing Facility(if recommended by PT) Living arrangements for the past 2 months: Single Family Home                                       Social Determinants of Health (SDOH) Interventions    Readmission Risk Interventions No flowsheet data found.

## 2018-11-02 LAB — CBC WITH DIFFERENTIAL/PLATELET
Abs Immature Granulocytes: 0.21 K/uL — ABNORMAL HIGH (ref 0.00–0.07)
Basophils Absolute: 0 K/uL (ref 0.0–0.1)
Basophils Relative: 0 %
Eosinophils Absolute: 0.2 K/uL (ref 0.0–0.5)
Eosinophils Relative: 2 %
HCT: 26.6 % — ABNORMAL LOW (ref 36.0–46.0)
Hemoglobin: 9 g/dL — ABNORMAL LOW (ref 12.0–15.0)
Immature Granulocytes: 2 %
Lymphocytes Relative: 8 %
Lymphs Abs: 0.7 K/uL (ref 0.7–4.0)
MCH: 32 pg (ref 26.0–34.0)
MCHC: 33.8 g/dL (ref 30.0–36.0)
MCV: 94.7 fL (ref 80.0–100.0)
Monocytes Absolute: 0.7 K/uL (ref 0.1–1.0)
Monocytes Relative: 7 %
Neutro Abs: 7.4 K/uL (ref 1.7–7.7)
Neutrophils Relative %: 81 %
Platelets: 235 K/uL (ref 150–400)
RBC: 2.81 MIL/uL — ABNORMAL LOW (ref 3.87–5.11)
RDW: 13.1 % (ref 11.5–15.5)
WBC: 9.2 K/uL (ref 4.0–10.5)
nRBC: 0 % (ref 0.0–0.2)

## 2018-11-02 LAB — MAGNESIUM: Magnesium: 1.7 mg/dL (ref 1.7–2.4)

## 2018-11-02 LAB — BASIC METABOLIC PANEL WITH GFR
Anion gap: 10 (ref 5–15)
BUN: 14 mg/dL (ref 8–23)
CO2: 22 mmol/L (ref 22–32)
Calcium: 8.6 mg/dL — ABNORMAL LOW (ref 8.9–10.3)
Chloride: 98 mmol/L (ref 98–111)
Creatinine, Ser: 0.71 mg/dL (ref 0.44–1.00)
GFR calc Af Amer: >60 mL/min (ref >60)
GFR calc non Af Amer: >60 mL/min (ref >60)
Glucose, Bld: 91 mg/dL (ref 70–99)
Potassium: 4 mmol/L (ref 3.5–5.1)
Sodium: 130 mmol/L — ABNORMAL LOW (ref 135–145)

## 2018-11-02 LAB — CULTURE, BLOOD (ROUTINE X 2)
Culture: NO GROWTH
Culture: NO GROWTH
Special Requests: ADEQUATE

## 2018-11-02 LAB — RHEUMATOID FACTOR: Rheumatoid fact SerPl-aCnc: 17.2 IU/mL — ABNORMAL HIGH (ref 0.0–13.9)

## 2018-11-02 MED ORDER — MAGNESIUM SULFATE 2 GM/50ML IV SOLN
2.0000 g | Freq: Once | INTRAVENOUS | Status: AC
  Administered 2018-11-02: 2 g via INTRAVENOUS
  Filled 2018-11-02: qty 50

## 2018-11-02 NOTE — Plan of Care (Signed)

## 2018-11-02 NOTE — Progress Notes (Signed)
PROGRESS NOTE    Caitlin Chavez  [id number] DOB: 1934/05/16 DOA: 10/28/2018 PCP: Aletha Halim., PA-C    Brief Narrative:  84 year old female who presented with weakness.  She does have significant past medical history for hypertension, hyponatremia, diverticulosis, GERD, hiatal hernia and arthritis.  She reported progressive generalized weakness.  She had a recent mechanical fall with right ankle injury.  Now with ambulatory dysfunction.  Due to progressive symptoms EMS was called, patient was found to be febrile.  On her initial physical examination blood pressure 136/69, heart rate 96, respiratory 21, oxygen saturation 96%, she had dry mucous membranes, lungs clear to auscultation bilaterally, heart S1-S2 present rhythm, abdomen soft nontender, no lower extremity edema.  Patient was admitted to the hospital with a working diagnosis of systemic response syndrome.   Patient continue to be febrile, despite antibiotic therapy. ID was consulted with recommendation to stop antibiotic therapy, further workup with bilateral ankles MRI and echocardiogram.   Patient with persistent pain, significant structural injury to the right angle, clinically mild improvement with anti-inflammatory regimen with ibuprofen.   Consulted orthopedics.   Assessment & Plan:   Principal Problem:   Fever Active Problems:   Essential tremor   Falls   Hiatal hernia   Hyponatremia   Generalized weakness   Essential hypertension   Hypoalbuminemia   Anxiety   Failure to thrive in adult   Ankle fracture, right   Acute bilateral ankle pain   History of gout    1.  Right ankle sprain with post injury fever and systemic inflammatory response/ ruled out ankle deep infection. Improved symptoms with anti-inflammatory regimen with ibuprofen, she has been afebrile. MRI with significant structural joint injury, patient had history of gout, but considering magnitude of ankle injury, per MRI report on the right  with tear of deltoid ligament, medial malleolus avulsion, osseous contusion, tear of the Achilles tendon. On the left with also significant inflammatory changes.   Less likely to be gout related arthropathy. Will dc colchicine. Is poke with orthopedics over the phone, changes related to ankle sprain, recommendations early mobilization, weight bearing as tolerated and physical therapy.   Will continue pain control with ibuprofen, acetaminophen and IV morphine, patient will need SNF.   2. HTN. Blood pressure control with carvedilol.   3. Hypokalemia, hypomagnesemia with Hyponatremia. Renal function has been stable, serum cr at 0,71 with K at 4,0 and serum bicarbonate at 22, Mg at 1,7. Will continue Mg correction with IV Mag, follow with electrolytes in am.   4. Acute metabolic encephalopathy. No further encephalopathy, continue pain control and physical therapy.   5. Acute urinary retention. Patient has failed voiding trail and foley catheter has been replaced. Will plan to repeat voiding trial before discharge.  DVT prophylaxis: enoxaparin   Code Status: full Family Communication: I spoke with patient's son at the bedside and all questions were addressed.  Disposition Plan/ discharge barriers: pending clinical improvement, will need SNF.     Body mass index is 28.29 kg/m. Malnutrition Type:      Malnutrition Characteristics:      Nutrition Interventions:     RN Pressure Injury Documentation:     Consultants:   Orthopedics over the phone  ID   Procedures:     Antimicrobials:       Subjective: Patient with no further fever, her right ankle pain has improved, but not yet back to baseline, continue to have significant tenderness to palpation and not able to ambulate. Positive diarrhea.  Continue to have foley catheter in place.   Objective: Vitals:   11/01/18 1954 11/01/18 2011 11/01/18 2350 11/02/18 0317  BP: 138/80 (!) 148/90 (!) 150/70 138/69  Pulse:  82 79 80 84  Resp:   18 18  Temp: 97.7 F (36.5 C) 98.8 F (37.1 C) 98.1 F (36.7 C) 98.3 F (36.8 C)  TempSrc: Oral Oral Oral Oral  SpO2: 98% 97% 98% 97%  Weight:      Height:        Intake/Output Summary (Last 24 hours) at 11/02/2018 1040 Last data filed at 11/02/2018 0300 Gross per 24 hour  Intake 517.7 ml  Output 1250 ml  Net -732.3 ml   Filed Weights   10/28/18 0314  Weight: 77.1 kg    Examination:   General: Not in pain or dyspnea, deconditioned  Neurology: Awake and alert, non focal  E ENT: mild pallor, no icterus, oral mucosa moist Cardiovascular: No JVD. S1-S2 present, rhythmic, no gallops, rubs, or murmurs. No lower extremity edema. Pulmonary: vesicular breath sounds bilaterally, adequate air movement, no wheezing, rhonchi or rales. Gastrointestinal. Abdomen with no organomegaly, non tender, no rebound or guarding Skin. No rashes Musculoskeletal: right ankle tender to palpation, no increased local temperature, decreased range of motion due to pain. No erythema.      Data Reviewed: I have personally reviewed following labs and imaging studies  CBC: Recent Labs  Lab 10/28/18 0248 10/29/18 0211 10/31/18 0328 11/01/18 0713 11/02/18 0511  WBC 10.7* 9.9 8.0 8.6 9.2  NEUTROABS 8.2*  --   --   --  7.4  HGB 10.4* 10.0* 9.5* 8.9* 9.0*  HCT 29.5* 28.1* 27.9* 25.3* 26.6*  MCV 94.6 93.4 93.6 92.7 94.7  PLT 288 260 211 219 [id number]   Basic Metabolic Panel: Recent Labs  Lab 10/29/18 0744 10/30/18 0246 10/31/18 0328 11/01/18 0713 11/02/18 0511  NA 131* 131* 129* 130* 130*  K 3.8 3.4* 3.3* 3.6 4.0  CL 96* 95* 94* 96* 98  CO2 20* 22 21* 23 22  GLUCOSE 100* 110* 104* 94 91  BUN 17 15 13 12 14   CREATININE 0.84 0.75 0.74 0.64 0.71  CALCIUM 8.4* 8.9 8.6* 8.5* 8.6*  MG  --   --   --  1.5* 1.7   GFR: Estimated Creatinine Clearance: 55.6 mL/min (by C-G formula based on SCr of 0.71 mg/dL). Liver Function Tests: Recent Labs  Lab 10/28/18 0248  AST 41  ALT 53*   ALKPHOS 74  BILITOT 0.7  PROT 6.1*  ALBUMIN 2.6*   No results for input(s): LIPASE, AMYLASE in the last 168 hours. Recent Labs  Lab 10/28/18 2210  AMMONIA 21   Coagulation Profile: No results for input(s): INR, PROTIME in the last 168 hours. Cardiac Enzymes: Recent Labs  Lab 10/29/18 1143  CKTOTAL 42   BNP (last 3 results) No results for input(s): PROBNP in the last 8760 hours. HbA1C: No results for input(s): HGBA1C in the last 72 hours. CBG: No results for input(s): GLUCAP in the last 168 hours. Lipid Profile: No results for input(s): CHOL, HDL, LDLCALC, TRIG, CHOLHDL, LDLDIRECT in the last 72 hours. Thyroid Function Tests: No results for input(s): TSH, T4TOTAL, FREET4, T3FREE, THYROIDAB in the last 72 hours. Anemia Panel: No results for input(s): VITAMINB12, FOLATE, FERRITIN, TIBC, IRON, RETICCTPCT in the last 72 hours.    Radiology Studies: I have reviewed all of the imaging during this hospital visit personally     Scheduled Meds: . acetaminophen  500 mg Oral Q6H  .  allopurinol  100 mg Oral BID  . carvedilol  6.25 mg Oral BID WC  . Chlorhexidine Gluconate Cloth  6 each Topical Daily  . colchicine  0.6 mg Oral TID  . feeding supplement (ENSURE ENLIVE)  237 mL Oral BID BM  . gabapentin  300 mg Oral BID  . ibuprofen  400 mg Oral TID WC  . influenza vaccine adjuvanted  0.5 mL Intramuscular Tomorrow-1000  . pantoprazole  40 mg Oral Daily  . primidone  150 mg Oral q morning - 10a   And  . primidone  100 mg Oral QHS  . sodium chloride flush  3 mL Intravenous Q12H  . tamsulosin  0.4 mg Oral Daily   Continuous Infusions:   LOS: 4 days        Trystin Terhune Gerome Apley, MD

## 2018-11-02 NOTE — Plan of Care (Signed)
  Problem: Clinical Measurements: Goal: Ability to maintain clinical measurements within normal limits will improve Outcome: Progressing   Problem: Activity: Goal: Risk for activity intolerance will decrease Outcome: Progressing   Problem: Elimination: Goal: Will not experience complications related to bowel motility Outcome: Progressing   Problem: Pain Managment: Goal: General experience of comfort will improve Outcome: Progressing   Problem: Safety: Goal: Ability to remain free from injury will improve Outcome: Progressing

## 2018-11-03 DIAGNOSIS — R410 Disorientation, unspecified: Secondary | ICD-10-CM

## 2018-11-03 LAB — CULTURE, BLOOD (ROUTINE X 2)
Culture: NO GROWTH
Culture: NO GROWTH

## 2018-11-03 LAB — CBC WITH DIFFERENTIAL/PLATELET
Abs Immature Granulocytes: 0.3 10*3/uL — ABNORMAL HIGH (ref 0.00–0.07)
Basophils Absolute: 0 10*3/uL (ref 0.0–0.1)
Basophils Relative: 0 %
Eosinophils Absolute: 0.3 10*3/uL (ref 0.0–0.5)
Eosinophils Relative: 3 %
HCT: 26.8 % — ABNORMAL LOW (ref 36.0–46.0)
Hemoglobin: 9.5 g/dL — ABNORMAL LOW (ref 12.0–15.0)
Immature Granulocytes: 3 %
Lymphocytes Relative: 7 %
Lymphs Abs: 0.6 10*3/uL — ABNORMAL LOW (ref 0.7–4.0)
MCH: 33 pg (ref 26.0–34.0)
MCHC: 35.4 g/dL (ref 30.0–36.0)
MCV: 93.1 fL (ref 80.0–100.0)
Monocytes Absolute: 0.6 10*3/uL (ref 0.1–1.0)
Monocytes Relative: 6 %
Neutro Abs: 7.1 10*3/uL (ref 1.7–7.7)
Neutrophils Relative %: 81 %
Platelets: 251 10*3/uL (ref 150–400)
RBC: 2.88 MIL/uL — ABNORMAL LOW (ref 3.87–5.11)
RDW: 13.2 % (ref 11.5–15.5)
WBC: 8.9 10*3/uL (ref 4.0–10.5)
nRBC: 0 % (ref 0.0–0.2)

## 2018-11-03 LAB — BASIC METABOLIC PANEL
Anion gap: 11 (ref 5–15)
BUN: 13 mg/dL (ref 8–23)
CO2: 22 mmol/L (ref 22–32)
Calcium: 8.7 mg/dL — ABNORMAL LOW (ref 8.9–10.3)
Chloride: 96 mmol/L — ABNORMAL LOW (ref 98–111)
Creatinine, Ser: 0.58 mg/dL (ref 0.44–1.00)
GFR calc Af Amer: >60 mL/min (ref >60)
GFR calc non Af Amer: >60 mL/min (ref >60)
Glucose, Bld: 94 mg/dL (ref 70–99)
Potassium: 4.2 mmol/L (ref 3.5–5.1)
Sodium: 129 mmol/L — ABNORMAL LOW (ref 135–145)

## 2018-11-03 LAB — MAGNESIUM: Magnesium: 1.9 mg/dL (ref 1.7–2.4)

## 2018-11-03 NOTE — Plan of Care (Signed)

## 2018-11-03 NOTE — Progress Notes (Signed)
Physical Therapy Treatment Patient Details Name: Caitlin Chavez MRN: [id number] DOB: Mar 04, 1934 Today's Date: 11/03/2018    History of Present Illness 84 y.o. female with medical history significant of hypertension, hyponatremia, diverticulosis, GERD, hiatal hernia, and arthritis; who presents with complaints of progressively worsening weakness. Pt with recent fall and R ankle fx (in boot), L ankle injury (in brace).    PT Comments    Pt remains limited by fatigue and weakness during sessions, significantly limiting her independence in functional mobility. Pt requires mod-maxA to stand and is unable to clear feet without PT facilitation for weight shifting. Pt will benefit from continued acute PT to reduce falls risk and caregiver burden.   Follow Up Recommendations  SNF     Equipment Recommendations  Wheelchair (measurements PT);Wheelchair cushion (measurements PT)    Recommendations for Other Services       Precautions / Restrictions Precautions Precautions: Fall Restrictions Weight Bearing Restrictions: No    Mobility  Bed Mobility Overal bed mobility: Needs Assistance Bed Mobility: Supine to Sit     Supine to sit: Mod assist        Transfers Overall transfer level: Needs assistance Equipment used: Rolling walker (2 wheeled) Transfers: Sit to/from Stand Sit to Stand: Max assist         General transfer comment: sit to stand x4 trials from bed, arm chair, and commode  Ambulation/Gait Ambulation/Gait assistance: Max assist Gait Distance (Feet): 2 Feet(steps to and from commode/recliner) Assistive device: Rolling walker (2 wheeled) Gait Pattern/deviations: Step-to pattern;Shuffle Gait velocity: reduced Gait velocity interpretation: <1.31 ft/sec, indicative of household ambulator General Gait Details: PT providing assistance to facilitate weight shit side to side, verbal cues to sequence gait   Stairs             Wheelchair Mobility    Modified  Rankin (Stroke Patients Only)       Balance Overall balance assessment: Needs assistance Sitting-balance support: Bilateral upper extremity supported;Feet supported Sitting balance-Leahy Scale: Poor Sitting balance - Comments: modA progressing to min guard   Standing balance support: Bilateral upper extremity supported Standing balance-Leahy Scale: Poor Standing balance comment: mod-maxA for standing with BUE support of RW                            Cognition Arousal/Alertness: Awake/alert Behavior During Therapy: WFL for tasks assessed/performed Overall Cognitive Status: Within Functional Limits for tasks assessed                                        Exercises      General Comments        Pertinent Vitals/Pain Pain Assessment: Faces Faces Pain Scale: Hurts a little bit Pain Location: BLE Pain Descriptors / Indicators: Aching;Sore Pain Intervention(s): Limited activity within patient's tolerance    Home Living                      Prior Function            PT Goals (current goals can now be found in the care plan section) Acute Rehab PT Goals Patient Stated Goal: To go home Progress towards PT goals: Progressing toward goals    Frequency    Min 2X/week      PT Plan Current plan remains appropriate    Co-evaluation  AM-PAC PT "6 Clicks" Mobility   Outcome Measure  Help needed turning from your back to your side while in a flat bed without using bedrails?: A Lot Help needed moving from lying on your back to sitting on the side of a flat bed without using bedrails?: A Lot Help needed moving to and from a bed to a chair (including a wheelchair)?: Total Help needed standing up from a chair using your arms (e.g., wheelchair or bedside chair)?: Total Help needed to walk in hospital room?: A Lot Help needed climbing 3-5 steps with a railing? : Total 6 Click Score: 9    End of Session Equipment  Utilized During Treatment: Gait belt Activity Tolerance: Patient limited by fatigue Patient left: in chair;with call bell/phone within reach;with chair alarm set Nurse Communication: Mobility status PT Visit Diagnosis: Other abnormalities of gait and mobility (R26.89)     Time: [id number] PT Time Calculation (min) (ACUTE ONLY): 46 min  Charges:  $Therapeutic Activity: 38-52 mins                     Zenaida Niece, PT, DPT Acute Rehabilitation Pager: [phone]    Zenaida Niece 11/03/2018, 12:34 PM

## 2018-11-03 NOTE — Progress Notes (Signed)
PROGRESS NOTE    Caitlin Chavez  [id number] DOB: 16-Jul-1934 DOA: 10/28/2018 PCP: Aletha Halim., PA-C    Brief Narrative:  84 year old female who presented with weakness.  She does have significant past medical history for hypertension, hyponatremia, diverticulosis, GERD, hiatal hernia and arthritis.  She reported progressive generalized weakness.  She had a recent mechanical fall with right ankle injury.  Now with ambulatory dysfunction.  Due to progressive symptoms EMS was called, patient was found to be febrile.  On her initial physical examination blood pressure 136/69, heart rate 96, respiratory 21, oxygen saturation 96%, she had dry mucous membranes, lungs clear to auscultation bilaterally, heart S1-S2 present rhythm, abdomen soft nontender, no lower extremity edema.  Patient was admitted to the hospital with a working diagnosis of systemic response syndrome.   Patient continue to be febrile, despite antibiotic therapy. ID was consulted with recommendation to stop antibiotic therapy, further workup with bilateral ankles MRI and echocardiogram.   Patient with persistent pain, significant structural injury to the right angle, clinically mild improvement with anti-inflammatory regimen with ibuprofen.   Consulted orthopedics.   Assessment & Plan:   Principal Problem:   Fever Active Problems:   Essential tremor   Falls   Hiatal hernia   Hyponatremia   Generalized weakness   Essential hypertension   Hypoalbuminemia   Anxiety   Failure to thrive in adult   Ankle fracture, right   Acute bilateral ankle pain   History of gout    1.  Right ankle sprain with post injury fever and systemic inflammatory response/ ruled out ankle deep infection. Improved symptoms with anti-inflammatory regimen with ibuprofen, she has been afebrile. MRI with significant structural joint injury, patient had history of gout, but considering magnitude of ankle injury, per MRI report on the right  with tear of deltoid ligament, medial malleolus avulsion, osseous contusion, tear of the Achilles tendon.  Less likely to be gout related arthropathy. Will dc colchicine. Dr. Cathlean Sauer spoke with orthopedics over the phone, changes related to ankle sprain, recommendations early mobilization, weight bearing as tolerated and physical therapy.  Will continue pain control with ibuprofen, acetaminophen and IV morphine, patient will need SNF.   2. HTN. Blood pressure control with carvedilol.   3. Hypokalemia, hypomagnesemia with Hyponatremia.  -repleted K and Mg -Na low: ? SIADH- fluid restrict  4. Acute metabolic encephalopathy.  -No further encephalopathy, continue pain control and physical therapy.   5. Acute urinary retention.  failed voiding trial on 10/4 and foley catheter has been replaced -suspect will need to follow up at urology   DVT prophylaxis: enoxaparin   Code Status: full Family Communication: called daughter in law, Lynelle Smoke [phone]-- decision about disposition needs to be made today Disposition Plan/ discharge barriers:  will need SNF vs home with 24 hour care       Consultants:   Orthopedics over the phone  ID      Subjective: Patient with no further fever, her right ankle pain has improved, but not yet back to baseline, continue to have significant tenderness to palpation and not able to ambulate. Positive diarrhea. Continue to have foley catheter in place.   Objective: Vitals:   11/02/18 1538 11/02/18 1949 11/03/18 0329 11/03/18 0742  BP: 130/78 (!) 148/73 (!) 150/81 131/71  Pulse: 78 82 99 88  Resp: 16 18 17 17   Temp: 98.3 F (36.8 C) 97.7 F (36.5 C) 98.8 F (37.1 C) (!) 97.3 F (36.3 C)  TempSrc: Oral Oral Oral Oral  SpO2: 98% 99% 96% 95%  Weight:      Height:        Intake/Output Summary (Last 24 hours) at 11/03/2018 1233 Last data filed at 11/03/2018 1141 Gross per 24 hour  Intake 957 ml  Output 2650 ml  Net -1693 ml   Filed Weights    10/28/18 0314  Weight: 77.1 kg    Examination:   General: in bed, NAD- appears weak Cardiovascular: rrr Pulmonary: no increased work of breathing Gastrointestinal. +BS, soft Musculoskeletal: foot in boot      Data Reviewed: I have personally reviewed following labs and imaging studies  CBC: Recent Labs  Lab 10/28/18 0248 10/29/18 0211 10/31/18 0328 11/01/18 0713 11/02/18 0511 11/03/18 0430  WBC 10.7* 9.9 8.0 8.6 9.2 8.9  NEUTROABS 8.2*  --   --   --  7.4 7.1  HGB 10.4* 10.0* 9.5* 8.9* 9.0* 9.5*  HCT 29.5* 28.1* 27.9* 25.3* 26.6* 26.8*  MCV 94.6 93.4 93.6 92.7 94.7 93.1  PLT 288 260 211 219 235 [id number]   Basic Metabolic Panel: Recent Labs  Lab 10/30/18 0246 10/31/18 0328 11/01/18 0713 11/02/18 0511 11/03/18 0430  NA 131* 129* 130* 130* 129*  K 3.4* 3.3* 3.6 4.0 4.2  CL 95* 94* 96* 98 96*  CO2 22 21* 23 22 22   GLUCOSE 110* 104* 94 91 94  BUN 15 13 12 14 13   CREATININE 0.75 0.74 0.64 0.71 0.58  CALCIUM 8.9 8.6* 8.5* 8.6* 8.7*  MG  --   --  1.5* 1.7 1.9   GFR: Estimated Creatinine Clearance: 55.6 mL/min (by C-G formula based on SCr of 0.58 mg/dL). Liver Function Tests: Recent Labs  Lab 10/28/18 0248  AST 41  ALT 53*  ALKPHOS 74  BILITOT 0.7  PROT 6.1*  ALBUMIN 2.6*   No results for input(s): LIPASE, AMYLASE in the last 168 hours. Recent Labs  Lab 10/28/18 2210  AMMONIA 21   Coagulation Profile: No results for input(s): INR, PROTIME in the last 168 hours. Cardiac Enzymes: Recent Labs  Lab 10/29/18 1143  CKTOTAL 42   BNP (last 3 results) No results for input(s): PROBNP in the last 8760 hours. HbA1C: No results for input(s): HGBA1C in the last 72 hours. CBG: No results for input(s): GLUCAP in the last 168 hours. Lipid Profile: No results for input(s): CHOL, HDL, LDLCALC, TRIG, CHOLHDL, LDLDIRECT in the last 72 hours. Thyroid Function Tests: No results for input(s): TSH, T4TOTAL, FREET4, T3FREE, THYROIDAB in the last 72 hours. Anemia  Panel: No results for input(s): VITAMINB12, FOLATE, FERRITIN, TIBC, IRON, RETICCTPCT in the last 72 hours.    Radiology Studies: I have reviewed all of the imaging during this hospital visit personally     Scheduled Meds: . acetaminophen  500 mg Oral Q6H  . allopurinol  100 mg Oral BID  . carvedilol  6.25 mg Oral BID WC  . Chlorhexidine Gluconate Cloth  6 each Topical Daily  . feeding supplement (ENSURE ENLIVE)  237 mL Oral BID BM  . gabapentin  300 mg Oral BID  . ibuprofen  400 mg Oral TID WC  . influenza vaccine adjuvanted  0.5 mL Intramuscular Tomorrow-1000  . pantoprazole  40 mg Oral Daily  . primidone  150 mg Oral q morning - 10a   And  . primidone  100 mg Oral QHS  . sodium chloride flush  3 mL Intravenous Q12H  . tamsulosin  0.4 mg Oral Daily   Continuous Infusions:   LOS: 5 days  Geradine Girt, DO

## 2018-11-03 NOTE — Progress Notes (Signed)
Subjective: No new complaints   Antibiotics:  Anti-infectives (From admission, onward)   Start     Dose/Rate Route Frequency Ordered Stop   10/28/18 1545  piperacillin-tazobactam (ZOSYN) IVPB 3.375 g  Status:  Discontinued     3.375 g 100 mL/hr over 30 Minutes Intravenous Every 8 hours 10/28/18 1532 10/28/18 1539   10/28/18 1545  piperacillin-tazobactam (ZOSYN) IVPB 3.375 g  Status:  Discontinued     3.375 g 12.5 mL/hr over 240 Minutes Intravenous Every 8 hours 10/28/18 1539 10/28/18 1941      Medications: Scheduled Meds: . acetaminophen  500 mg Oral Q6H  . allopurinol  100 mg Oral BID  . carvedilol  6.25 mg Oral BID WC  . Chlorhexidine Gluconate Cloth  6 each Topical Daily  . feeding supplement (ENSURE ENLIVE)  237 mL Oral BID BM  . gabapentin  300 mg Oral BID  . ibuprofen  400 mg Oral TID WC  . influenza vaccine adjuvanted  0.5 mL Intramuscular Tomorrow-1000  . pantoprazole  40 mg Oral Daily  . primidone  150 mg Oral q morning - 10a   And  . primidone  100 mg Oral QHS  . sodium chloride flush  3 mL Intravenous Q12H  . tamsulosin  0.4 mg Oral Daily   Continuous Infusions: PRN Meds:.acetaminophen **OR** acetaminophen, ALPRAZolam, hydrALAZINE, ondansetron **OR** ondansetron (ZOFRAN) IV, traMADol    Objective: Weight change:   Intake/Output Summary (Last 24 hours) at 11/03/2018 1418 Last data filed at 11/03/2018 1408 Gross per 24 hour  Intake 954 ml  Output 2350 ml  Net -1396 ml   Blood pressure 131/71, pulse 88, temperature (!) 97.3 F (36.3 C), temperature source Oral, resp. rate 17, height 5\' 5"  (1.651 m), weight 77.1 kg, SpO2 95 %. Temp:  [97.3 F (36.3 C)-98.8 F (37.1 C)] 97.3 F (36.3 C) (10/05 0742) Pulse Rate:  [78-99] 88 (10/05 0742) Resp:  [16-18] 17 (10/05 0742) BP: (130-150)/(71-81) 131/71 (10/05 0742) SpO2:  [95 %-99 %] 95 % (10/05 0742)  Physical Exam: General: Alert and awake, oriented to person pleasant HEENT: anicteric sclera,  EOMI CVS regular rate, normal  Chest: , no wheezing, no respiratory distress Abdomen: soft non-distended,  Extremities: Still tender in ankles bilaterally right worse than left  skin: no rashes Neuro: nonfocal  CBC:    BMET Recent Labs    11/02/18 0511 11/03/18 0430  NA 130* 129*  K 4.0 4.2  CL 98 96*  CO2 22 22  GLUCOSE 91 94  BUN 14 13  CREATININE 0.71 0.58  CALCIUM 8.6* 8.7*     Liver Panel  No results for input(s): PROT, ALBUMIN, AST, ALT, ALKPHOS, BILITOT, BILIDIR, IBILI in the last 72 hours.     Sedimentation Rate No results for input(s): ESRSEDRATE in the last 72 hours. C-Reactive Protein No results for input(s): CRP in the last 72 hours.  Micro Results: Recent Results (from the past 720 hour(s))  Urine culture     Status: None   Collection Time: 10/28/18  2:38 AM   Specimen: In/Out Cath Urine  Result Value Ref Range Status   Specimen Description IN/OUT CATH URINE  Final   Special Requests NONE  Final   Culture   Final    NO GROWTH Performed at [facility], [address] [place], [place] [address]    Report Status 10/29/2018 FINAL  Final  SARS Coronavirus 2 [facility] order, Performed in [facility] hospital lab) Nasopharyngeal Nasopharyngeal  Swab     Status: None   Collection Time: 10/28/18  2:48 AM   Specimen: Nasopharyngeal Swab  Result Value Ref Range Status   SARS Coronavirus 2 NEGATIVE NEGATIVE Final    Comment: (NOTE) If result is NEGATIVE SARS-CoV-2 target nucleic acids are NOT DETECTED. The SARS-CoV-2 RNA is generally detectable in upper and lower  respiratory specimens during the acute phase of infection. The lowest  concentration of SARS-CoV-2 viral copies this assay can detect is 250  copies / mL. A negative result does not preclude SARS-CoV-2 infection  and should not be used as the sole basis for treatment or other  patient management decisions.  A negative result may occur with  improper specimen collection / handling,  submission of specimen other  than nasopharyngeal swab, presence of viral mutation(s) within the  areas targeted by this assay, and inadequate number of viral copies  (<250 copies / mL). A negative result must be combined with clinical  observations, patient history, and epidemiological information. If result is POSITIVE SARS-CoV-2 target nucleic acids are DETECTED. The SARS-CoV-2 RNA is generally detectable in upper and lower  respiratory specimens dur ing the acute phase of infection.  Positive  results are indicative of active infection with SARS-CoV-2.  Clinical  correlation with patient history and other diagnostic information is  necessary to determine patient infection status.  Positive results do  not rule out bacterial infection or co-infection with other viruses. If result is PRESUMPTIVE POSTIVE SARS-CoV-2 nucleic acids MAY BE PRESENT.   A presumptive positive result was obtained on the submitted specimen  and confirmed on repeat testing.  While 2019 novel coronavirus  (SARS-CoV-2) nucleic acids may be present in the submitted sample  additional confirmatory testing may be necessary for epidemiological  and / or clinical management purposes  to differentiate between  SARS-CoV-2 and other Sarbecovirus currently known to infect humans.  If clinically indicated additional testing with an alternate test  methodology [id number]) is advised. The SARS-CoV-2 RNA is generally  detectable in upper and lower respiratory sp ecimens during the acute  phase of infection. The expected result is Negative. Fact Sheet for Patients:  StrictlyIdeas.no Fact Sheet for Healthcare Providers: BankingDealers.co.za This test is not yet approved or cleared by the [place] FDA and has been authorized for detection and/or diagnosis of SARS-CoV-2 by FDA under an Emergency Use Authorization (EUA).  This EUA will remain in effect (meaning this test can be  used) for the duration of the COVID-19 declaration under Section 564(b)(1) of the Act, 21 U.S.C. section 360bbb-3(b)(1), unless the authorization is terminated or revoked sooner. Performed at [facility], [place] [address] [place], [place] [address]   Culture, blood (routine x 2)     Status: None   Collection Time: 10/28/18  2:51 AM   Specimen: BLOOD  Result Value Ref Range Status   Specimen Description BLOOD RIGHT ARM  Final   Special Requests   Final    BOTTLES DRAWN AEROBIC AND ANAEROBIC Blood Culture results may not be optimal due to an excessive volume of blood received in culture bottles   Culture   Final    NO GROWTH 5 DAYS Performed at [facility], [place] [address] [place], [place] [address]    Report Status 11/02/2018 FINAL  Final  Culture, blood (routine x 2)     Status: None   Collection Time: 10/28/18  3:51 AM   Specimen: BLOOD  Result Value Ref Range Status   Specimen  Description BLOOD RIGHT HAND  Final   Special Requests   Final    BOTTLES DRAWN AEROBIC AND ANAEROBIC Blood Culture adequate volume   Culture   Final    NO GROWTH 5 DAYS Performed at [facility], [address], [place], [place] [address]    Report Status 11/02/2018 FINAL  Final  Culture, blood (routine x 2)     Status: None   Collection Time: 10/29/18  6:57 PM   Specimen: BLOOD LEFT HAND  Result Value Ref Range Status   Specimen Description BLOOD LEFT HAND  Final   Special Requests   Final    BOTTLES DRAWN AEROBIC AND ANAEROBIC Blood Culture results may not be optimal due to an inadequate volume of blood received in culture bottles   Culture   Final    NO GROWTH 5 DAYS Performed at [facility], [place] [address], [place], [place] [address]    Report Status 11/03/2018 FINAL  Final  Culture, blood (routine x 2)     Status: None   Collection Time: 10/29/18  7:03 PM   Specimen: BLOOD RIGHT HAND  Result Value Ref Range Status   Specimen Description BLOOD RIGHT HAND  Final    Special Requests   Final    BOTTLES DRAWN AEROBIC AND ANAEROBIC Blood Culture results may not be optimal due to an inadequate volume of blood received in culture bottles   Culture   Final    NO GROWTH 5 DAYS Performed at [facility], [place] [address] [place], [place] [address]    Report Status 11/03/2018 FINAL  Final  Respiratory Panel by PCR     Status: None   Collection Time: 10/30/18  5:32 PM   Specimen: Nasopharyngeal Swab; Respiratory  Result Value Ref Range Status   Adenovirus NOT DETECTED NOT DETECTED Final   Coronavirus 229E NOT DETECTED NOT DETECTED Final    Comment: (NOTE) The Coronavirus on the Respiratory Panel, DOES NOT test for the novel  Coronavirus (2019 nCoV)    Coronavirus HKU1 NOT DETECTED NOT DETECTED Final   Coronavirus NL63 NOT DETECTED NOT DETECTED Final   Coronavirus OC43 NOT DETECTED NOT DETECTED Final   Metapneumovirus NOT DETECTED NOT DETECTED Final   Rhinovirus / Enterovirus NOT DETECTED NOT DETECTED Final   Influenza A NOT DETECTED NOT DETECTED Final   Influenza B NOT DETECTED NOT DETECTED Final   Parainfluenza Virus 1 NOT DETECTED NOT DETECTED Final   Parainfluenza Virus 2 NOT DETECTED NOT DETECTED Final   Parainfluenza Virus 3 NOT DETECTED NOT DETECTED Final   Parainfluenza Virus 4 NOT DETECTED NOT DETECTED Final   Respiratory Syncytial Virus NOT DETECTED NOT DETECTED Final   Bordetella pertussis NOT DETECTED NOT DETECTED Final   Chlamydophila pneumoniae NOT DETECTED NOT DETECTED Final   Mycoplasma pneumoniae NOT DETECTED NOT DETECTED Final    Comment: Performed at [facility], [place] [address] [place], [place] [address]    Studies/Results: No results found.    Assessment/Plan:  INTERVAL HISTORY: Patient's fevers have disappeared.   Principal Problem:   Fever Active Problems:   Essential tremor   Falls   Hiatal hernia   Hyponatremia   Generalized weakness   Essential hypertension   Hypoalbuminemia   Anxiety   Failure  to thrive in adult   Ankle fracture, right   Acute bilateral ankle pain   History of gout    Caitlin Chavez is a 84 y.o. female with history of ankle  fracture admitted with ankle pain confusion and high-grade fevers though without a clear-cut source.  Her fever seems to have defervesced off of antibiotics.  She did have elevated sed rate and CRP  I do think gout is still certainly a possibility.  I would continue observe her off antibiotics.  If she has recurrence of fevers would consult orthopedic surgery to aspirate her most point painful joint and sent for crystal analysis and if positive for gout treat with corticosteroids.  I will sign off for now please call with further questions.   LOS: 5 days   Alcide Evener 11/03/2018, 2:18 PM

## 2018-11-03 NOTE — TOC Progression Note (Addendum)
Transition of Care [facility]) - Progression Note    Patient Details  Name: Caitlin Chavez MRN: [id number] Date of Birth: [birth year]  Transition of [facility]) CM/SW [place] RN, BSN, NCM-BC, [place] [phone] Phone Number: 11/03/2018, 1:40 PM  Clinical Narrative:    CM following for transitional needs. CM spoke to Tammy (daughter in law) to discuss DCP needs for [facility] vs SNF. Daughter in law unsure if the family would like patient to transfer to a SNF d/t COVID concerns, but is agreeable to [facility] SNF, pending bed availability vs HH with private duty arranged by family. CM spoke to [employer]), with facility able to offer a bed once insurance Josem Kaufmann is obtained and COVID test resulted. CM will arranged PTAR transportation and continue to follow for needs.    Expected Discharge Plan: Skilled Nursing Facility Barriers to Discharge: Insurance Authorization  Expected Discharge Plan and Services Expected Discharge Plan: [place] In-house Referral: Clinical Social Work   Post Acute Care Choice: Skilled Nursing Facility(if recommended by PT) Living arrangements for the past 2 months: Single Family Home                   Social Determinants of Health (SDOH) Interventions    Readmission Risk Interventions No flowsheet data found.

## 2018-11-03 NOTE — Care Management Important Message (Signed)
Important Message  Patient Details  Name: Caitlin Chavez MRN: [id number] Date of Birth: 07-06-1934   Medicare Important Message Given:  Yes     Memory Argue 11/03/2018, 4:31 PM

## 2018-11-04 LAB — CBC
HCT: 26.4 % — ABNORMAL LOW (ref 36.0–46.0)
Hemoglobin: 9 g/dL — ABNORMAL LOW (ref 12.0–15.0)
MCH: 31.9 pg (ref 26.0–34.0)
MCHC: 34.1 g/dL (ref 30.0–36.0)
MCV: 93.6 fL (ref 80.0–100.0)
Platelets: 283 10*3/uL (ref 150–400)
RBC: 2.82 MIL/uL — ABNORMAL LOW (ref 3.87–5.11)
RDW: 13.2 % (ref 11.5–15.5)
WBC: 11 10*3/uL — ABNORMAL HIGH (ref 4.0–10.5)
nRBC: 0.2 % (ref 0.0–0.2)

## 2018-11-04 LAB — BASIC METABOLIC PANEL
Anion gap: 9 (ref 5–15)
BUN: 21 mg/dL (ref 8–23)
CO2: 25 mmol/L (ref 22–32)
Calcium: 8.4 mg/dL — ABNORMAL LOW (ref 8.9–10.3)
Chloride: 95 mmol/L — ABNORMAL LOW (ref 98–111)
Creatinine, Ser: 0.69 mg/dL (ref 0.44–1.00)
GFR calc Af Amer: >60 mL/min (ref >60)
GFR calc non Af Amer: >60 mL/min (ref >60)
Glucose, Bld: 135 mg/dL — ABNORMAL HIGH (ref 70–99)
Potassium: 3.5 mmol/L (ref 3.5–5.1)
Sodium: 129 mmol/L — ABNORMAL LOW (ref 135–145)

## 2018-11-04 LAB — NOVEL CORONAVIRUS, NAA (HOSP ORDER, SEND-OUT TO REF LAB; TAT 18-24 HRS): SARS-CoV-2, NAA: NOT DETECTED

## 2018-11-04 MED ORDER — LISINOPRIL 20 MG PO TABS
20.0000 mg | ORAL_TABLET | Freq: Every day | ORAL | Status: DC
  Administered 2018-11-04 – 2018-11-06 (×3): 20 mg via ORAL
  Filled 2018-11-04 (×3): qty 1

## 2018-11-04 MED ORDER — LOPERAMIDE HCL 2 MG PO CAPS
2.0000 mg | ORAL_CAPSULE | Freq: Once | ORAL | Status: AC
  Administered 2018-11-04: 2 mg via ORAL
  Filled 2018-11-04: qty 1

## 2018-11-04 NOTE — Care Management (Signed)
COVID results faxed to [facility] SN[facility].  Midge Minium RN, BSN, NCM-BC, ACM-RN [phone]

## 2018-11-04 NOTE — Progress Notes (Signed)
   11/04/18 1132  SNF Authorization Status  SNF Authorization Type Transition of Care CM/SW Authorization Request  SNF Auth Started 11/04/18  SNF Auth Start Time 1132  SNF Authorization Status Started (Pending auth: [id number])   CM spoke to Frederico Hamman Bernadene Bell) with pending auth obtained; still awaiting insurance auth; may not receive till 11/05/18.

## 2018-11-04 NOTE — Plan of Care (Signed)
  Problem: Education: Goal: Knowledge of General Education information will improve Description: Including pain rating scale, medication(s)/side effects and non-pharmacologic comfort measures Outcome: Progressing   Problem: Clinical Measurements: Goal: Ability to maintain clinical measurements within normal limits will improve Outcome: Progressing   Problem: Nutrition: Goal: Adequate nutrition will be maintained Outcome: Progressing   Problem: Pain Managment: Goal: General experience of comfort will improve Outcome: Progressing   Problem: Safety: Goal: Ability to remain free from injury will improve Outcome: Progressing   Problem: Skin Integrity: Goal: Risk for impaired skin integrity will decrease Outcome: Progressing   

## 2018-11-04 NOTE — Progress Notes (Addendum)
PROGRESS NOTE    Caitlin Chavez  [id number] DOB: November 22, 1934 DOA: 10/28/2018 PCP: Aletha Halim., PA-C    Brief Narrative:  84 year old female who presented with weakness.  She does have significant past medical history for hypertension, hyponatremia, diverticulosis, GERD, hiatal hernia and arthritis.  She reported progressive generalized weakness.  She had a recent mechanical fall with right ankle injury.  Now with ambulatory dysfunction.  Due to progressive symptoms EMS was called, patient was found to be febrile.  On her initial physical examination blood pressure 136/69, heart rate 96, respiratory 21, oxygen saturation 96%, she had dry mucous membranes, lungs clear to auscultation bilaterally, heart S1-S2 present rhythm, abdomen soft nontender, no lower extremity edema.  Patient was admitted to the hospital with a working diagnosis of systemic response syndrome.   Patient continue to be febrile, despite antibiotic therapy. ID was consulted with recommendation to stop antibiotic therapy, further workup with bilateral ankles MRI and echocardiogram.     Assessment & Plan:   Principal Problem:   FUO (fever of unknown origin) Active Problems:   Essential tremor   Falls   Hiatal hernia   Hyponatremia   Generalized weakness   Essential hypertension   Hypoalbuminemia   Anxiety   Failure to thrive in adult   Ankle fracture, right   Acute bilateral ankle pain   History of gout    1.  Right ankle sprain with post injury fever and systemic inflammatory response/ ruled out ankle deep infection. Improved symptoms with anti-inflammatory regimen with ibuprofen, she has been afebrile. MRI with significant structural joint injury, patient had history of gout, but considering magnitude of ankle injury, per MRI report on the right with tear of deltoid ligament, medial malleolus avulsion, osseous contusion, tear of the Achilles tendon.  Less likely to be gout related arthropathy. Will dc  colchicine. Dr. Cathlean Sauer spoke with orthopedics over the phone, changes related to ankle sprain, recommendations early mobilization, weight bearing as tolerated and physical therapy.  Will continue pain control with ibuprofen, acetaminophen and IV morphine, patient will need SNF.   2. HTN.  -coreg, add lisinopril  3. Hypokalemia, hypomagnesemia with Hyponatremia.  -repleted K and Mg -Na low: ? SIADH- fluid restrict  4. Acute metabolic encephalopathy.  -No further encephalopathy, continue pain control and physical therapy.   5. Acute urinary retention.  failed voiding trial on 10/4 and foley catheter has been replaced -suspect will need to follow up at urology for voiding trial  6. Reported diarrhea -no episodes this AM -GI pathogen panel sent -had been getting colchicine until 10/4 -heme test pending (had been on ibuprofen--- d/c'd)   DVT prophylaxis: enoxaparin   Code Status: full Family Communication: called daughter in law, Tammy [phone]- on 10/5 Disposition Plan/ discharge barriers:  will need SNF      Consultants:   Orthopedics over the phone  ID      Subjective: Reported diarrhea last night- none thus far today  Objective: Vitals:   11/03/18 1539 11/03/18 2009 11/04/18 0339 11/04/18 0810  BP: (!) 147/72 (!) 154/81 (!) 178/87 (!) 158/66  Pulse: 97 95 (!) 107 98  Resp: 16 18  17   Temp:  97.8 F (36.6 C) 98.1 F (36.7 C) 97.8 F (36.6 C)  TempSrc:  Oral Oral Oral  SpO2: 97% 98% 97% 98%  Weight:      Height:        Intake/Output Summary (Last 24 hours) at 11/04/2018 1237 Last data filed at 11/04/2018 1000 Gross per  24 hour  Intake 577 ml  Output 1851 ml  Net -1274 ml   Filed Weights   10/28/18 0314  Weight: 77.1 kg    Examination:   General: in bed, weak appearing Cardiovascular: rrr Gastrointestinal. +BS, soft, NT Musculoskeletal: moves all 4 ext, no focal weakness      Data Reviewed: I have personally reviewed following labs  and imaging studies  CBC: Recent Labs  Lab 10/31/18 0328 11/01/18 0713 11/02/18 0511 11/03/18 0430 11/04/18 0409  WBC 8.0 8.6 9.2 8.9 11.0*  NEUTROABS  --   --  7.4 7.1  --   HGB 9.5* 8.9* 9.0* 9.5* 9.0*  HCT 27.9* 25.3* 26.6* 26.8* 26.4*  MCV 93.6 92.7 94.7 93.1 93.6  PLT 211 219 235 251 [id number]   Basic Metabolic Panel: Recent Labs  Lab 10/31/18 0328 11/01/18 0713 11/02/18 0511 11/03/18 0430 11/04/18 0409  NA 129* 130* 130* 129* 129*  K 3.3* 3.6 4.0 4.2 3.5  CL 94* 96* 98 96* 95*  CO2 21* 23 22 22 25   GLUCOSE 104* 94 91 94 135*  BUN 13 12 14 13 21   CREATININE 0.74 0.64 0.71 0.58 0.69  CALCIUM 8.6* 8.5* 8.6* 8.7* 8.4*  MG  --  1.5* 1.7 1.9  --    GFR: Estimated Creatinine Clearance: 55.6 mL/min (by C-G formula based on SCr of 0.69 mg/dL). Liver Function Tests: No results for input(s): AST, ALT, ALKPHOS, BILITOT, PROT, ALBUMIN in the last 168 hours. No results for input(s): LIPASE, AMYLASE in the last 168 hours. Recent Labs  Lab 10/28/18 2210  AMMONIA 21   Coagulation Profile: No results for input(s): INR, PROTIME in the last 168 hours. Cardiac Enzymes: Recent Labs  Lab 10/29/18 1143  CKTOTAL 42   BNP (last 3 results) No results for input(s): PROBNP in the last 8760 hours. HbA1C: No results for input(s): HGBA1C in the last 72 hours. CBG: No results for input(s): GLUCAP in the last 168 hours. Lipid Profile: No results for input(s): CHOL, HDL, LDLCALC, TRIG, CHOLHDL, LDLDIRECT in the last 72 hours. Thyroid Function Tests: No results for input(s): TSH, T4TOTAL, FREET4, T3FREE, THYROIDAB in the last 72 hours. Anemia Panel: No results for input(s): VITAMINB12, FOLATE, FERRITIN, TIBC, IRON, RETICCTPCT in the last 72 hours.    Radiology Studies: I have reviewed all of the imaging during this hospital visit personally     Scheduled Meds: . acetaminophen  500 mg Oral Q6H  . allopurinol  100 mg Oral BID  . carvedilol  6.25 mg Oral BID WC  . Chlorhexidine  Gluconate Cloth  6 each Topical Daily  . feeding supplement (ENSURE ENLIVE)  237 mL Oral BID BM  . gabapentin  300 mg Oral BID  . influenza vaccine adjuvanted  0.5 mL Intramuscular Tomorrow-1000  . pantoprazole  40 mg Oral Daily  . primidone  150 mg Oral q morning - 10a   And  . primidone  100 mg Oral QHS  . sodium chloride flush  3 mL Intravenous Q12H  . tamsulosin  0.4 mg Oral Daily   Continuous Infusions:   LOS: 6 days    Geradine Girt, DO

## 2018-11-04 NOTE — TOC Progression Note (Signed)
Transition of Care [facility]) - Progression Note    Patient Details  Name: Caitlin Chavez MRN: [id number] Date of Birth: 07-Apr-1934  Transition of Care [facility]) CM/SW [place], RN Phone Number: 11/04/2018, 4:03 PM  Clinical Narrative:      11/04/18 1552  SNF Authorization Status  SNF Authorization Complete 11/04/18  SNF Auth Complete Time [id number]  SNF Authorization Status Complete (Auth: [id number])   CM spoke with Elmer Sow); insurance auth received. Auth #[id number]. Dr. Eliseo Squires updated with [facility] to be arranged for an early discharge on 11/05/18; Patients GI panel is still pending. CM will continue to follow    Expected Discharge Plan: [place] Barriers to Discharge: Continued Medical Work up(awaiting GI panel results)  Expected Discharge Plan and Services Expected Discharge Plan: [place] In-house Referral: Clinical Social Work   Post Acute Care Choice: Skilled Nursing Facility(if recommended by PT) Living arrangements for the past 2 months: Single Family Home                   Social Determinants of Health (SDOH) Interventions    Readmission Risk Interventions No flowsheet data found.

## 2018-11-05 LAB — OCCULT BLOOD X 1 CARD TO LAB, STOOL: Fecal Occult Bld: NEGATIVE

## 2018-11-05 MED ORDER — LOPERAMIDE HCL 2 MG PO CAPS
2.0000 mg | ORAL_CAPSULE | ORAL | Status: DC | PRN
  Administered 2018-11-05: 14:00:00 2 mg via ORAL
  Filled 2018-11-05: qty 1

## 2018-11-05 MED ORDER — SODIUM CHLORIDE 0.9 % IV SOLN
INTRAVENOUS | Status: DC
  Administered 2018-11-05 – 2018-11-06 (×2): via INTRAVENOUS

## 2018-11-05 NOTE — TOC Transition Note (Addendum)
Transition of Care [facility]) - CM/SW Discharge Note   Patient Details  Name: DONNAMARIA KOFFLER MRN: [id number] Date of Birth: [birth year]  Transition of Care [facility]) CM/SW Contact:  Midge Minium RN, BSN, NCM-BC, ACM-RN [phone] Phone Number: 11/05/2018, 11:24 AM   Clinical Narrative:    Patient not medically stable to transition to [facility] SNF d/t having diarrhea.CM will f/u with SNF on bed status and if additional auth is needed. Patient will require an updated COVID test. CM updated Dr. Karleen Hampshire. CM will continue to follow.   Updated patients family on the POC.  Final next level of care: [place] Barriers to Discharge: Continued Medical Work up   Patient Goals and CMS Choice Patient states their goals for this hospitalization and ongoing recovery are:: family and patient express that the goal is for patient to return to her baseline level of functioning before the fall. CMS Medicare.gov Compare Post Acute Care list provided to:: Patient Represenative (must comment) Choice offered to / list presented to : Adult Children(Tammy [phone])  Discharge Placement              Patient chooses bed at: [facility] Patient to be transferred to facility by: [facility] Name of family member notified: Tammy (daughter-in-law) Patient and family notified of of transfer: 11/05/18  Discharge Plan and Services In-house Referral: Clinical Social Work   Post Acute Care Choice: Skilled Nursing Facility(if recommended by PT)             Social Determinants of Health (SDOH) Interventions     Readmission Risk Interventions No flowsheet data found.

## 2018-11-05 NOTE — Progress Notes (Signed)
PROGRESS NOTE    Caitlin Chavez  [id number] DOB: 1934/06/13 DOA: 10/28/2018 PCP: Aletha Halim., PA-C  Brief Narrative:   A prior history of hypertension hyponatremia GERD, hiatal hernia, diverticulosis, arthritis presents with mechanical fall was found to have a right ankle injury.  She presented to ED with SIRS, with fevers and right ankle pain. MRI OF THE ANKLE  Shows Reactive marrow/edema involving the talus, lateral malleolus, and medial malleolus. No evidence of osteomyelitis or osseous fracture. Focal disruption of the anterior talofibular ligament with fluid in the syndesmosis Diffuse subcutaneous edema seen surrounding the ankle with trace ankle and subtalar joint edema. Diffuse muscular edema surrounding the midfoot.   Assessment & Plan:   Principal Problem:   FUO (fever of unknown origin) Active Problems:   Essential tremor   Falls   Hiatal hernia   Hyponatremia   Generalized weakness   Essential hypertension   Hypoalbuminemia   Anxiety   Failure to thrive in adult   Ankle fracture, right   Acute bilateral ankle pain   History of gout   Right ankle pain s/p fall  MRI of the ankle shows diffuse subcutaneous edema surrounding the ankle with trace ankle and subtalar joint edema no evidence of osteomyelitis or osseous fracture.  Focal disruption of the anterior talofibular fibular ligament with fluid.  Discussed with orthopedics over the phone recommended early mobilization and weightbearing as tolerated and physical therapy. Pain control for now.  Infectious disease consulted recommended to stop the antibiotics and if recurrent fever patient will need aspiration of the joint for further evaluation.    Patient seen and examined today denies having any fever today has mild leukocytosis.   PT evaluation recommending SNF.    Essential hypertension Better controlled Resume Coreg and the lisinopril  Hypokalemia, hypomagnesemia and  hyponatremia Replaced    Acute metabolic encephalopathy  resolved    Persistent diarrhea GI pathogen PCR sent Probably secondary to colchicine Imodium as needed     Acute urinary retention Foley catheter placed recommend outpatient follow-up with urology for voiding trial.   DVT prophylaxis: SCDs Code Status full code Family Communication: Family at bedside  disposition Plan: Pending resolution of the diarrhea and pending GI pathogen PCR results  Consultants:   Infectious disease  Procedures: None Antimicrobials: None  Subjective: Patient reports having runny bowel movements today she reports she feels weak and tired.  She denies any chest pain shortness of breath, nausea vomiting. No abdominal pain  Objective: Vitals:   11/04/18 1811 11/04/18 2150 11/05/18 0540 11/05/18 0834  BP: 129/73 (!) 152/77 (!) 141/77 134/75  Pulse: (!) 104 (!) 106 (!) 102 91  Resp: 17 18 18 16   Temp: 98.8 F (37.1 C) 99.6 F (37.6 C) 98.7 F (37.1 C) 98.9 F (37.2 C)  TempSrc: Oral Oral Oral Oral  SpO2: 97% 96% 92% 94%  Weight:      Height:        Intake/Output Summary (Last 24 hours) at 11/05/2018 1324 Last data filed at 11/05/2018 0500 Gross per 24 hour  Intake 120 ml  Output 1600 ml  Net -1480 ml   Filed Weights   10/28/18 0314  Weight: 77.1 kg    Examination:  General exam: Elderly lady ill-appearing lethargic. Respiratory system: Clear to auscultation. Respiratory effort normal. Cardiovascular system: S1 & S2 heard, RRR. No JVD, Gastrointestinal system: Abdomen is soft, nontender, nondistended Central nervous system: Alert and oriented. No focal neurological deficits. Extremities: Symmetric 5 x 5 power. Skin: No  rashes, lesions or ulcers Psychiatry:  Mood & affect appropriate.     Data Reviewed: I have personally reviewed following labs and imaging studies  CBC: Recent Labs  Lab 10/31/18 0328 11/01/18 0713 11/02/18 0511 11/03/18 0430 11/04/18 0409   WBC 8.0 8.6 9.2 8.9 11.0*  NEUTROABS  --   --  7.4 7.1  --   HGB 9.5* 8.9* 9.0* 9.5* 9.0*  HCT 27.9* 25.3* 26.6* 26.8* 26.4*  MCV 93.6 92.7 94.7 93.1 93.6  PLT 211 219 235 251 [id number]   Basic Metabolic Panel: Recent Labs  Lab 10/31/18 0328 11/01/18 0713 11/02/18 0511 11/03/18 0430 11/04/18 0409  NA 129* 130* 130* 129* 129*  K 3.3* 3.6 4.0 4.2 3.5  CL 94* 96* 98 96* 95*  CO2 21* 23 22 22 25   GLUCOSE 104* 94 91 94 135*  BUN 13 12 14 13 21   CREATININE 0.74 0.64 0.71 0.58 0.69  CALCIUM 8.6* 8.5* 8.6* 8.7* 8.4*  MG  --  1.5* 1.7 1.9  --    GFR: Estimated Creatinine Clearance: 55.6 mL/min (by C-G formula based on SCr of 0.69 mg/dL). Liver Function Tests: No results for input(s): AST, ALT, ALKPHOS, BILITOT, PROT, ALBUMIN in the last 168 hours. No results for input(s): LIPASE, AMYLASE in the last 168 hours. No results for input(s): AMMONIA in the last 168 hours. Coagulation Profile: No results for input(s): INR, PROTIME in the last 168 hours. Cardiac Enzymes: No results for input(s): CKTOTAL, CKMB, CKMBINDEX, TROPONINI in the last 168 hours. BNP (last 3 results) No results for input(s): PROBNP in the last 8760 hours. HbA1C: No results for input(s): HGBA1C in the last 72 hours. CBG: No results for input(s): GLUCAP in the last 168 hours. Lipid Profile: No results for input(s): CHOL, HDL, LDLCALC, TRIG, CHOLHDL, LDLDIRECT in the last 72 hours. Thyroid Function Tests: No results for input(s): TSH, T4TOTAL, FREET4, T3FREE, THYROIDAB in the last 72 hours. Anemia Panel: No results for input(s): VITAMINB12, FOLATE, FERRITIN, TIBC, IRON, RETICCTPCT in the last 72 hours. Sepsis Labs: No results for input(s): PROCALCITON, LATICACIDVEN in the last 168 hours.  Recent Results (from the past 240 hour(s))  Urine culture     Status: None   Collection Time: 10/28/18  2:38 AM   Specimen: In/Out Cath Urine  Result Value Ref Range Status   Specimen Description IN/OUT CATH URINE  Final    Special Requests NONE  Final   Culture   Final    NO GROWTH Performed at [facility], [address], [place], [place] [address]    Report Status 10/29/2018 FINAL  Final  SARS Coronavirus 2 [facility] order, Performed in [facility] hospital lab) Nasopharyngeal Nasopharyngeal Swab     Status: None   Collection Time: 10/28/18  2:48 AM   Specimen: Nasopharyngeal Swab  Result Value Ref Range Status   SARS Coronavirus 2 NEGATIVE NEGATIVE Final    Comment: (NOTE) If result is NEGATIVE SARS-CoV-2 target nucleic acids are NOT DETECTED. The SARS-CoV-2 RNA is generally detectable in upper and lower  respiratory specimens during the acute phase of infection. The lowest  concentration of SARS-CoV-2 viral copies this assay can detect is 250  copies / mL. A negative result does not preclude SARS-CoV-2 infection  and should not be used as the sole basis for treatment or other  patient management decisions.  A negative result may occur with  improper specimen collection / handling, submission of specimen other  than nasopharyngeal swab, presence of viral mutation(s) within  the  areas targeted by this assay, and inadequate number of viral copies  (<250 copies / mL). A negative result must be combined with clinical  observations, patient history, and epidemiological information. If result is POSITIVE SARS-CoV-2 target nucleic acids are DETECTED. The SARS-CoV-2 RNA is generally detectable in upper and lower  respiratory specimens dur ing the acute phase of infection.  Positive  results are indicative of active infection with SARS-CoV-2.  Clinical  correlation with patient history and other diagnostic information is  necessary to determine patient infection status.  Positive results do  not rule out bacterial infection or co-infection with other viruses. If result is PRESUMPTIVE POSTIVE SARS-CoV-2 nucleic acids MAY BE PRESENT.   A presumptive positive result was obtained on the submitted  specimen  and confirmed on repeat testing.  While 2019 novel coronavirus  (SARS-CoV-2) nucleic acids may be present in the submitted sample  additional confirmatory testing may be necessary for epidemiological  and / or clinical management purposes  to differentiate between  SARS-CoV-2 and other Sarbecovirus currently known to infect humans.  If clinically indicated additional testing with an alternate test  methodology [phone]) is advised. The SARS-CoV-2 RNA is generally  detectable in upper and lower respiratory sp ecimens during the acute  phase of infection. The expected result is Negative. Fact Sheet for Patients:  StrictlyIdeas.no Fact Sheet for Healthcare Providers: BankingDealers.co.za This test is not yet approved or cleared by the [place] FDA and has been authorized for detection and/or diagnosis of SARS-CoV-2 by FDA under an Emergency Use Authorization (EUA).  This EUA will remain in effect (meaning this test can be used) for the duration of the COVID-19 declaration under Section 564(b)(1) of the Act, 21 U.S.C. section 360bbb-3(b)(1), unless the authorization is terminated or revoked sooner. Performed at [facility], [place] [address] [place], [place] [address]   Culture, blood (routine x 2)     Status: None   Collection Time: 10/28/18  2:51 AM   Specimen: BLOOD  Result Value Ref Range Status   Specimen Description BLOOD RIGHT ARM  Final   Special Requests   Final    BOTTLES DRAWN AEROBIC AND ANAEROBIC Blood Culture results may not be optimal due to an excessive volume of blood received in culture bottles   Culture   Final    NO GROWTH 5 DAYS Performed at [facility], [place] [address], [place], [place] [address]    Report Status 11/02/2018 FINAL  Final  Culture, blood (routine x 2)     Status: None   Collection Time: 10/28/18  3:51 AM   Specimen: BLOOD  Result Value Ref Range Status   Specimen  Description BLOOD RIGHT HAND  Final   Special Requests   Final    BOTTLES DRAWN AEROBIC AND ANAEROBIC Blood Culture adequate volume   Culture   Final    NO GROWTH 5 DAYS Performed at [facility], [address], [place], [place] [address]    Report Status 11/02/2018 FINAL  Final  Culture, blood (routine x 2)     Status: None   Collection Time: 10/29/18  6:57 PM   Specimen: BLOOD LEFT HAND  Result Value Ref Range Status   Specimen Description BLOOD LEFT HAND  Final   Special Requests   Final    BOTTLES DRAWN AEROBIC AND ANAEROBIC Blood Culture results may not be optimal due to an inadequate volume of blood received in culture bottles   Culture  Final    NO GROWTH 5 DAYS Performed at [facility], [place] [address] [place], [place] [address]    Report Status 11/03/2018 FINAL  Final  Culture, blood (routine x 2)     Status: None   Collection Time: 10/29/18  7:03 PM   Specimen: BLOOD RIGHT HAND  Result Value Ref Range Status   Specimen Description BLOOD RIGHT HAND  Final   Special Requests   Final    BOTTLES DRAWN AEROBIC AND ANAEROBIC Blood Culture results may not be optimal due to an inadequate volume of blood received in culture bottles   Culture   Final    NO GROWTH 5 DAYS Performed at [facility], [place] [address] [place], [place] [address]    Report Status 11/03/2018 FINAL  Final  Respiratory Panel by PCR     Status: None   Collection Time: 10/30/18  5:32 PM   Specimen: Nasopharyngeal Swab; Respiratory  Result Value Ref Range Status   Adenovirus NOT DETECTED NOT DETECTED Final   Coronavirus 229E NOT DETECTED NOT DETECTED Final    Comment: (NOTE) The Coronavirus on the Respiratory Panel, DOES NOT test for the novel  Coronavirus (2019 nCoV)    Coronavirus HKU1 NOT DETECTED NOT DETECTED Final   Coronavirus NL63 NOT DETECTED NOT DETECTED Final   Coronavirus OC43 NOT DETECTED NOT DETECTED Final   Metapneumovirus NOT DETECTED NOT DETECTED Final   Rhinovirus  / Enterovirus NOT DETECTED NOT DETECTED Final   Influenza A NOT DETECTED NOT DETECTED Final   Influenza B NOT DETECTED NOT DETECTED Final   Parainfluenza Virus 1 NOT DETECTED NOT DETECTED Final   Parainfluenza Virus 2 NOT DETECTED NOT DETECTED Final   Parainfluenza Virus 3 NOT DETECTED NOT DETECTED Final   Parainfluenza Virus 4 NOT DETECTED NOT DETECTED Final   Respiratory Syncytial Virus NOT DETECTED NOT DETECTED Final   Bordetella pertussis NOT DETECTED NOT DETECTED Final   Chlamydophila pneumoniae NOT DETECTED NOT DETECTED Final   Mycoplasma pneumoniae NOT DETECTED NOT DETECTED Final    Comment: Performed at [facility], [place] [address], [place], [place] [address]  Novel Coronavirus, NAA (hospital order; send-out to ref lab)     Status: None   Collection Time: 11/03/18  4:47 PM   Specimen: Nasopharyngeal Swab; Respiratory  Result Value Ref Range Status   SARS-CoV-2, NAA NOT DETECTED NOT DETECTED Final    Comment: (NOTE) This nucleic acid amplification test was developed and its performance characteristics determined by [employer]. Nucleic acid amplification tests include PCR and TMA. This test has not been FDA cleared or approved. This test has been authorized by FDA under an Emergency Use Authorization (EUA). This test is only authorized for the duration of time the declaration that circumstances exist justifying the authorization of the emergency use of in vitro diagnostic tests for detection of SARS-CoV-2 virus and/or diagnosis of COVID-19 infection under section 564(b)(1) of the Act, 21 U.S.C. [id number]) (1), unless the authorization is terminated or revoked sooner. When diagnostic testing is negative, the possibility of a false negative result should be considered in the context of a patient's recent exposures and the presence of clinical signs and symptoms consistent with COVID-19. An individual without symptoms of COVID- 19 and who is not shedding  SARS-CoV-2 vi rus would expect to have a negative (not detected) result in this assay. Performed At: [facility] [address] [place], [place] [id number] Rush Farmer MD [id number]    [place]  Final    Comment: Performed at [facility], [place] [address], [place], [place] [address]         Radiology Studies: No results found.      Scheduled Meds: . acetaminophen  500 mg Oral Q6H  . allopurinol  100 mg Oral BID  . carvedilol  6.25 mg Oral BID WC  . Chlorhexidine Gluconate Cloth  6 each Topical Daily  . feeding supplement (ENSURE ENLIVE)  237 mL Oral BID BM  . gabapentin  300 mg Oral BID  . influenza vaccine adjuvanted  0.5 mL Intramuscular Tomorrow-1000  . lisinopril  20 mg Oral Daily  . pantoprazole  40 mg Oral Daily  . primidone  150 mg Oral q morning - 10a   And  . primidone  100 mg Oral QHS  . sodium chloride flush  3 mL Intravenous Q12H  . tamsulosin  0.4 mg Oral Daily   Continuous Infusions:   LOS: 7 days        Hosie Poisson, MD [facility] Pager [phone] If 7PM-7AM, please contact night-coverage [email or web address] Password [email or web address] 11/05/2018, 1:24 PM

## 2018-11-05 NOTE — Plan of Care (Signed)

## 2018-11-06 LAB — BASIC METABOLIC PANEL
Anion gap: 10 (ref 5–15)
BUN: 15 mg/dL (ref 8–23)
CO2: 24 mmol/L (ref 22–32)
Calcium: 8.4 mg/dL — ABNORMAL LOW (ref 8.9–10.3)
Chloride: 94 mmol/L — ABNORMAL LOW (ref 98–111)
Creatinine, Ser: 0.63 mg/dL (ref 0.44–1.00)
GFR calc Af Amer: >60 mL/min (ref >60)
GFR calc non Af Amer: >60 mL/min (ref >60)
Glucose, Bld: 93 mg/dL (ref 70–99)
Potassium: 3.9 mmol/L (ref 3.5–5.1)
Sodium: 128 mmol/L — ABNORMAL LOW (ref 135–145)

## 2018-11-06 LAB — NOVEL CORONAVIRUS, NAA (HOSP ORDER, SEND-OUT TO REF LAB; TAT 18-24 HRS): SARS-CoV-2, NAA: NOT DETECTED

## 2018-11-06 LAB — SODIUM: Sodium: 128 mmol/L — ABNORMAL LOW (ref 135–145)

## 2018-11-06 MED ORDER — PREDNISONE 20 MG PO TABS: 40.0000 mg | ORAL_TABLET | Freq: Every day | ORAL | 0 refills | Status: DC

## 2018-11-06 MED ORDER — PREDNISONE 20 MG PO TABS: 40.0000 mg | ORAL_TABLET | Freq: Every day | ORAL | Status: DC

## 2018-11-06 MED ORDER — PANTOPRAZOLE SODIUM 40 MG PO TBEC: 40.0000 mg | DELAYED_RELEASE_TABLET | Freq: Every day | ORAL | 0 refills | Status: DC

## 2018-11-06 MED ORDER — TAMSULOSIN HCL 0.4 MG PO CAPS: 0.4000 mg | ORAL_CAPSULE | Freq: Every day | ORAL | 0 refills | Status: DC

## 2018-11-06 MED ORDER — ENSURE ENLIVE PO LIQD: 237.0000 mL | Freq: Two times a day (BID) | ORAL | 12 refills | Status: DC

## 2018-11-06 MED ORDER — LOPERAMIDE HCL 2 MG PO CAPS: 2.0000 mg | ORAL_CAPSULE | ORAL | 0 refills | Status: DC | PRN

## 2018-11-06 NOTE — Progress Notes (Signed)
Palliative Medicine RN Note: Note consult for [facility], but also discharge in process. If patient is discharged before we can see her tomorrow, we recommend referral to outpatient palliative care to follow at the facility.  Marjie Skiff Maeci Kalbfleisch, RN, BSN, [facility] Palliative Medicine Team 11/06/2018 3:20 PM Office [phone]

## 2018-11-06 NOTE — Plan of Care (Signed)
  Problem: Pain Managment: Goal: General experience of comfort will improve Outcome: Progressing   Problem: Safety: Goal: Ability to remain free from injury will improve Outcome: Progressing   

## 2018-11-06 NOTE — Progress Notes (Signed)
Physical Therapy Treatment Patient Details Name: Caitlin Chavez MRN: [id number] DOB: [birth year] Today's Date: 11/06/2018    History of Present Illness 84 y.o. female with medical history significant of hypertension, hyponatremia, diverticulosis, GERD, hiatal hernia, and arthritis; who presents with complaints of progressively worsening weakness. Pt with recent fall and R ankle fx (in boot), L ankle injury (in brace).    PT Comments    Pt pleasant in bed rolled on left hip on arrival with assist to remove pillows and required physical assist to rotate pelvis. Pt generally weak with inability to significantly move RLE in supine or sitting, decreased grip strength and UE function who was barely able to stand from high surface with use of stedy and 2 person assist. Pt reports pain in Right and left ankle with all mobility limiting her function. Pt remains appropriate for SNF with goals downgraded, nursing aware of lift equipment needed for OOB and pt encouraged to move as much as she can in bed.     Follow Up Recommendations  SNF     Equipment Recommendations  Wheelchair (measurements PT);Wheelchair cushion (measurements PT)    Recommendations for Other Services       Precautions / Restrictions Precautions Precautions: Fall Required Braces or Orthoses: Other Brace Other Brace: CAM boot RLE, ankle brace LLE Restrictions Weight Bearing Restrictions: Yes RLE Weight Bearing: Weight bearing as tolerated LLE Weight Bearing: Weight bearing as tolerated    Mobility  Bed Mobility Overal bed mobility: Needs Assistance Bed Mobility: Supine to Sit;Sit to Supine     Supine to sit: Mod assist Sit to supine: Max assist;+2 for physical assistance   General bed mobility comments: physical assist to bring legs to EOB and elevate trunk with increased time. Return to supine with max +2 assist to control trunk and lift bil LE onto surface  Transfers Overall transfer level: Needs assistance    Transfers: Sit to/from Stand Sit to Stand: Max assist;+2 physical assistance;From elevated surface         General transfer comment: attempted to stand from elevated bed with max assist with pt unable to clear surface. With use of stedy +2 assist and elevated bed with assist of pad at sacrum pt barely able to clear sacrum onto stedy with repeated trial x 2 for standing in stedy then standing to clear pad for return to bed. Pt with great struggle to rise from high surface and deferred chair today stating she did not want to be lifted out of chair  Ambulation/Gait             General Gait Details: unable   Stairs             Wheelchair Mobility    Modified Rankin (Stroke Patients Only)       Balance Overall balance assessment: Needs assistance   Sitting balance-Leahy Scale: Poor Sitting balance - Comments: pt with mod to min assist EOB due to left anterior lean   Standing balance support: Bilateral upper extremity supported Standing balance-Leahy Scale: Poor                              Cognition Arousal/Alertness: Awake/alert Behavior During Therapy: Flat affect Overall Cognitive Status: Impaired/Different from baseline Area of Impairment: Following commands;Safety/judgement                       Following Commands: Follows one step commands inconsistently Safety/Judgement: Decreased awareness of  deficits;Decreased awareness of safety     General Comments: pt very slow to process and demonstrates inability to move bil LE in bed      Exercises      General Comments        Pertinent Vitals/Pain Pain Score: 7  Pain Location: bil ankles Pain Descriptors / Indicators: Aching;Sore;Guarding Pain Intervention(s): Limited activity within patient's tolerance;Repositioned    Home Living                      Prior Function            PT Goals (current goals can now be found in the care plan section) Acute Rehab PT  Goals Time For Goal Achievement: 11/20/18 Potential to Achieve Goals: Fair Progress towards PT goals: Not progressing toward goals - comment;Goals downgraded-see care plan    Frequency    Min 2X/week      PT Plan Current plan remains appropriate    Co-evaluation              AM-PAC PT "6 Clicks" Mobility   Outcome Measure  Help needed turning from your back to your side while in a flat bed without using bedrails?: Total Help needed moving from lying on your back to sitting on the side of a flat bed without using bedrails?: Total Help needed moving to and from a bed to a chair (including a wheelchair)?: Total Help needed standing up from a chair using your arms (e.g., wheelchair or bedside chair)?: Total Help needed to walk in hospital room?: Total Help needed climbing 3-5 steps with a railing? : Total 6 Click Score: 6    End of Session Equipment Utilized During Treatment: Gait belt Activity Tolerance: Patient limited by fatigue Patient left: in bed;with call bell/phone within reach;with bed alarm set;with nursing/sitter in room Nurse Communication: Mobility status PT Visit Diagnosis: Other abnormalities of gait and mobility (R26.89);Muscle weakness (generalized) (M62.81);Unsteadiness on feet (R26.81)     Time: [id number] PT Time Calculation (min) (ACUTE ONLY): 29 min  Charges:  $Therapeutic Activity: 23-37 mins                     [place], PT Acute Rehabilitation Services Pager: [phone] Office: [place] 11/06/2018, 1:23 PM

## 2018-11-06 NOTE — Plan of Care (Signed)

## 2018-11-06 NOTE — Discharge Summary (Signed)
Physician Discharge Summary  Caitlin Chavez [id number] DOB: 1934/12/11 DOA: 10/28/2018  PCP: Aletha Halim., PA-C  Admit date: 10/28/2018 Discharge date: 11/06/2018  Admitted From: [facility].  Disposition:  [facility]  Recommendations for Outpatient Follow-up:  1. Follow up with PCP in 1-2 weeks 2. Please obtain BMP/CBC in one week 3. Please follow up with Urology for  voiding trial.  4. Please follow up with palliative care services at SNF.    Discharge Condition: GUARDED CODE STATUS:full code.  Diet recommendation: Heart Healthy  Brief/Interim Summary: A prior history of hypertension hyponatremia GERD, hiatal hernia, diverticulosis, arthritis presents with mechanical fall was found to have a right ankle injury.  She presented to ED with SIRS, with fevers and right ankle pain.   MRI OF THE ANKLE  Shows Reactive marrow/edema involving the talus, lateral malleolus, and medial malleolus. No evidence of osteomyelitis or osseous fracture. Focal disruption of the anterior talofibular ligament with fluid in the syndesmosis Diffuse subcutaneous edema seen surrounding the ankle with trace ankle and subtalar joint edema. Diffuse muscular edema surrounding the midfoot.  Discharge Diagnoses:  Principal Problem:   FUO (fever of unknown origin) Active Problems:   Essential tremor   Falls   Hiatal hernia   Hyponatremia   Generalized weakness   Essential hypertension   Hypoalbuminemia   Anxiety   Failure to thrive in adult   Ankle fracture, right   Acute bilateral ankle pain   History of gout  Right ankle pain s/p fall  MRI of the ankle shows diffuse subcutaneous edema surrounding the ankle with trace ankle and subtalar joint edema no evidence of osteomyelitis or osseous fracture.  Focal disruption of the anterior talofibular fibular ligament with fluid.  Discussed with orthopedics over the phone recommended early mobilization and weightbearing as tolerated and physical  therapy. Pain control for now.  Infectious disease consulted recommended to stop the antibiotics and if recurrent fever patient will need aspiration of the joint for further evaluation.    No fevers today. Will start the patient on prednisone for 7 days for gout flare up.  Pain controlled.    PT evaluation recommending SNF.    Essential hypertension Better controlled Resume Coreg and the lisinopril  Hypokalemia, hypomagnesemia  Replaced    Acute metabolic encephalopathy  resolved    Persistent diarrhea GI pathogen PCR sent Probably secondary to colchicine Imodium as needed  Hyponatremia:  Probably from fluids.  Stop the fluids.  Restart the hydrochlorthiazide.    Acute urinary retention Foley catheter placed recommend outpatient follow-up with urology for voiding trial.   Discharge Instructions  Discharge Instructions    Diet - low sodium heart healthy   Complete by: As directed    Discharge instructions   Complete by: As directed    Please follow up with PCP in one week. Please follow up with palliative care services at SNF.  Please follow up with Urology in one week.     Allergies as of 11/06/2018      Reactions   Alka-seltzer [aspirin Effervescent] Anaphylaxis, Hives   Beef-derived Products Anaphylaxis   Nsaids Anaphylaxis, Hives   Other Anaphylaxis, Rash   Pork-derived Products Anaphylaxis   Codeine Hives, Nausea And Vomiting      Medication List    STOP taking these medications   meloxicam 15 MG tablet Commonly known as: MOBIC   omeprazole 20 MG capsule Commonly known as: PRILOSEC Replaced by: pantoprazole 40 MG tablet     TAKE these medications  allopurinol 100 MG tablet Commonly known as: ZYLOPRIM Take 100 mg by mouth 2 (two) times daily.   ALPRAZolam 0.5 MG tablet Commonly known as: XANAX Take 0.5 mg by mouth 2 (two) times daily as needed for anxiety.   benazepril-hydrochlorthiazide 20-25 MG tablet Commonly  known as: LOTENSIN HCT Take 1 tablet by mouth daily.   CALCIUM 600+D PO Take 1 tablet by mouth daily.   carvedilol 6.25 MG tablet Commonly known as: COREG Take 6.25 mg by mouth 2 (two) times daily with a meal.   EPINEPHrine 0.3 mg/0.3 mL Soaj injection Commonly known as: EPI-PEN Inject 0.3 mLs (0.3 mg total) into the skin once as needed (for allergic reaction).   feeding supplement (ENSURE ENLIVE) Liqd Take 237 mLs by mouth 2 (two) times daily between meals.   gabapentin 300 MG capsule Commonly known as: NEURONTIN Take 300 mg by mouth 2 (two) times daily.   loperamide 2 MG capsule Commonly known as: IMODIUM Take 1 capsule (2 mg total) by mouth as needed for diarrhea or loose stools.   OCUVITE PO Take 1 tablet by mouth daily.   pantoprazole 40 MG tablet Commonly known as: PROTONIX Take 1 tablet (40 mg total) by mouth daily. Start taking on: November 07, 2018 Replaces: omeprazole 20 MG capsule   predniSONE 20 MG tablet Commonly known as: DELTASONE Take 2 tablets (40 mg total) by mouth daily before breakfast.   primidone 50 MG tablet Commonly known as: MYSOLINE 3 in AM, 2 in PM What changed:   how much to take  how to take this  when to take this   tamsulosin 0.4 MG Caps capsule Commonly known as: FLOMAX Take 1 capsule (0.4 mg total) by mouth daily. Start taking on: November 07, 2018   vitamin B-12 100 MCG tablet Commonly known as: CYANOCOBALAMIN Take 100 mcg by mouth daily.      Follow-up Information    [facility] Follow up.   Specialty: [place] Why: rehab Contact information: [address] [place] [phone]         Allergies  Allergen Reactions  . Alka-Seltzer [Aspirin Effervescent] Anaphylaxis and Hives  . Beef-Derived Products Anaphylaxis  . Nsaids Anaphylaxis and Hives  . Other Anaphylaxis and Rash  . Pork-Derived Products Anaphylaxis  . Codeine Hives and Nausea And  Vomiting    Consultations:  Infectious disease,     Procedures/Studies: Ct Abdomen Pelvis Wo Contrast  Result Date: 10/28/2018 CLINICAL DATA:  Generalized weakness after fall 2 weeks ago. EXAM: CT ABDOMEN AND PELVIS WITHOUT CONTRAST TECHNIQUE: Multidetector CT imaging of the abdomen and pelvis was performed following the standard protocol without IV contrast. COMPARISON:  CT scan of November 21, 2015. FINDINGS: Lower chest: Large sliding-type hiatal hernia is noted. Visualized lung bases are unremarkable. Hepatobiliary: No focal liver abnormality is seen. Status post cholecystectomy. No biliary dilatation. Pancreas: Unremarkable. No pancreatic ductal dilatation or surrounding inflammatory changes. Spleen: Normal in size without focal abnormality. Adrenals/Urinary Tract: Adrenal glands are unremarkable. Kidneys are normal, without renal calculi, focal lesion, or hydronephrosis. Bladder is unremarkable. Stomach/Bowel: Stomach is within normal limits. Appendix appears normal. No evidence of bowel wall thickening, distention, or inflammatory changes. Vascular/Lymphatic: Aortic atherosclerosis. No enlarged abdominal or pelvic lymph nodes. Reproductive: Status post hysterectomy. No adnexal masses. Other: Small fat containing periumbilical hernia is noted. No ascites is noted. Musculoskeletal: No acute or significant osseous findings. IMPRESSION: Large sliding-type hiatal hernia. Small fat containing periumbilical hernia. No acute abnormality seen in  the abdomen or pelvis. Aortic Atherosclerosis (ICD10-I70.0). Electronically Signed   By: Marijo Conception M.D.   On: 10/28/2018 19:03   Dg Ankle 2 Views Right  Result Date: 10/30/2018 CLINICAL DATA:  Fall, ankle pain EXAM: RIGHT ANKLE - 2 VIEW COMPARISON:  None. FINDINGS: Irregularity and small bone fragments noted at the tip of the medial malleolus concerning for avulsion fracture. No fibular abnormality. Ankle mortise is intact with joint space maintained.  Diffuse soft tissue swelling. IMPRESSION: Irregularity and small bone fragments at the tip of the medial malleolus compatible with avulsion fractures. Electronically Signed   By: Rolm Baptise M.D.   On: 10/30/2018 12:02   Mr Ankle Right W Wo Contrast  Addendum Date: 10/31/2018   ADDENDUM REPORT: 10/31/2018 20:24 ADDENDUM: No abnormal enhancement is seen to suggest osteomyelitis or abscess. Electronically Signed   By: Prudencio Pair M.D.   On: 10/31/2018 20:24   Result Date: 10/31/2018 CLINICAL DATA:  Bilateral ankle pain with history of fracture, fevers EXAM: MRI OF THE RIGHT ANKLE WITHOUT AND WITH CONTRAST TECHNIQUE: Multiplanar, multisequence MR imaging of the ankle was performed before and after the administration of intravenous contrast. CONTRAST:  13mL GADAVIST GADOBUTROL 1 MMOL/ML IV SOLN COMPARISON:  Radiograph October 30, 2018 FINDINGS: TENDONS Peroneal: Peroneal longus tendon intact. Peroneal brevis intact. There is a small amount of fluid seen surrounding the peroneal tendons. Posteromedial: Posterior tibial tendon intact. Flexor hallucis longus tendon intact. Flexor digitorum longus tendon intact. There is a small amount of fluid seen surrounding the posterior tibialis tendon however it is intact. Anterior: Tibialis anterior tendon intact. Extensor hallucis longus tendon intact Extensor digitorum longus tendon intact. A small amount of fluid is seen surrounding the extensor digitorum tendons, however there are intact. Achilles: There is increased intrasubstance signal and thickening seen at the insertion site of the Achilles tendon with tiny probable interstitial tear is seen at the insertion site. A small amount of retrocalcaneal bursal fluid is seen. Plantar Fascia: Intact. LIGAMENTS Lateral: There is heterogeneous signal with attenuation seen of the anterior talofibular ligament, however there are intact fibers. Calcaneofibular ligament intact. Posterior talofibular ligament intact. Anterior and  posterior tibiofibular ligaments intact. Medial: There is heterogeneous signal seen throughout the deltoid ligament with probable interstitial tear. There are however intact fibers seen anteriorly. The spring ligament appears to be intact. CARTILAGE Ankle Joint: No joint effusion. Normal ankle mortise. No chondral defect. Subtalar Joints/Sinus Tarsi: There is a small ankle and subtalar joint effusion. Bones: There is cortical irregularity seen at the medial malleolus with significant surrounding marrow edema, consistent with the prior avulsion. There is increased marrow signal seen within the mid body and anterior process talus, medial navicular, medial cuneiform and third metatarsal. Soft Tissue: Best seen on series 15, image 10 there is a nonenhancing T1/T2 dark probable loose body measuring 5 mm adjacent to the mid body of the talus, likely the a pulsed fragment. There is significant surrounding soft tissue edema and subcutaneous edema seen surrounding the ankle. There is heel pad inflammation. A small amount of fluid seen posterior to the Achilles tendon is seen. There is feathery signal seen within the muscles surrounding the hindfoot. IMPRESSION: IMPRESSION 1. Interstitial partial tear of the deltoid ligament with surrounding edema. 2. Cortical irregularity at the medial malleolus, consistent with the prior avulsion with surrounding marrow edema. 3. 5 mm loose body seen adjacent to the medial talus, likely a avulsed fragment from the medial malleolus. 4. Osseous contusion involving the talus, navicular, medial cuneiform,  and third metatarsal 5. Intrasubstance sprain of the anterior talofibular ligament 6. Insertional Achilles tendinosis with tiny interstitial tear at the insertion site. 7. Mild Retrocalcaneal bursitis 8. Mild peroneal, posterior tibialalis, extensor digitorum tenosynovitis 9. Significant ankle subcutaneous soft tissue edema with heel pad inflammation. 10. Muscular edema surrounding the  hindfoot. Electronically Signed: By: Prudencio Pair M.D. On: 10/31/2018 20:11   Mr Ankle Left W Wo Contrast  Result Date: 10/31/2018 CLINICAL DATA:  Fever of unknown origin, bilateral foot pain EXAM: MRI OF THE LEFT ANKLE WITHOUT AND WITH CONTRAST TECHNIQUE: Multiplanar, multisequence MR imaging of the ankle was performed before and after the administration of intravenous contrast. CONTRAST:  102mL GADAVIST GADOBUTROL 1 MMOL/ML IV SOLN COMPARISON:  None. FINDINGS: TENDONS Peroneal: Peroneal longus tendon intact. Peroneal brevis intact. There is a small amount of fluid seen surrounding the peroneal tendons. Posteromedial: Posterior tibial tendon intact. Flexor hallucis longus tendon intact. Flexor digitorum longus tendon intact. There is a small amount of fluid seen surrounding the posterior tibialis tendon at the level of the midfoot. Anterior: Tibialis anterior tendon intact. Extensor hallucis longus tendon intact Extensor digitorum longus tendon intact. Achilles: Increased intrasubstance signal thickening seen at the Achilles tendon insertion site extending approximately 2 cm. Plantar Fascia: Intact. LIGAMENTS Lateral: There is a focal disruption of the anterior talofibular ligament anteriorly with fluid in the syndesmosis. Calcaneofibular ligament intact. Posterior talofibular ligament intact. Anterior and posterior tibiofibular ligaments intact. Medial: There is heterogeneous signal seen within the deltoid ligament with intrasubstance partial interstitial tears. Spring ligament intact. CARTILAGE Ankle Joint: There is a small ankle joint effusion. Normal ankle mortise. No chondral defect. Subtalar Joints/Sinus Tarsi: Small amount of subtalar joint fluid is seen. There is mild edema seen within the sinus tarsi. Bones: There is increased STIR signal seen throughout the talus medial malleolus, and lateral malleolus. No abnormal enhancement is seen. No definite fracture. There is osteoarthritis seen within the third  metatarsal cuneiform joint with subchondral cystic changes. Soft Tissue: There is diffuse subcutaneous edema seen surrounding the ankle. There is mildly increased feathery signal seen with throughout the musculature surrounding the midfoot. No enhancing loculated fluid collections are seen. There is heel pad inflammation. IMPRESSION: IMPRESSION 1. Reactive marrow/edema involving the talus, lateral malleolus, and medial malleolus. No evidence of osteomyelitis or osseous fracture. 2. Focal disruption of the anterior talofibular ligament with fluid in the syndesmosis. 3. Peroneal and posterior tibialis tenosynovitis 4. Insertional Achilles tendinosis 5. Intrasubstance deltoid sprain with partial interstitial tear. 6. Diffuse subcutaneous edema seen surrounding the ankle with trace ankle and subtalar joint edema. 7. Diffuse muscular edema surrounding the midfoot. 8. No loculated fluid collection/abscess. Electronically Signed   By: Prudencio Pair M.D.   On: 10/31/2018 20:32   Dg Chest Portable 1 View  Result Date: 10/28/2018 CLINICAL DATA:  Fever. Generalized weakness. Fall 2 weeks ago. EXAM: PORTABLE CHEST 1 VIEW COMPARISON:  Radiograph 07/11/2015. FINDINGS: The cardiomediastinal contours are unchanged. Moderate retrocardiac hiatal hernia with adjacent atelectasis at the left lung base. Pulmonary vasculature is normal. No consolidation, pleural effusion, or pneumothorax. No acute osseous abnormalities are seen. IMPRESSION: 1. No acute abnormality. 2. Moderate retrocardiac hiatal hernia with adjacent atelectasis at the left lung base. Electronically Signed   By: Keith Rake M.D.   On: 10/28/2018 03:20   Vas [place] Lower Extremity Venous (dvt)  Result Date: 10/31/2018  Lower Venous Study Indications: Pain, Swelling, and right ankle fracture.  Comparison Study: no prior Performing Technologist: June Leap RDMS, RVT  Examination Guidelines:  A complete evaluation includes B-mode imaging, spectral Doppler, color  Doppler, and power Doppler as needed of all accessible portions of each vessel. Bilateral testing is considered an integral part of a complete examination. Limited examinations for reoccurring indications may be performed as noted.  +---------+---------------+---------+-----------+----------+--------------+ RIGHT    CompressibilityPhasicitySpontaneityPropertiesThrombus Aging +---------+---------------+---------+-----------+----------+--------------+ CFV      Full           Yes      Yes                                 +---------+---------------+---------+-----------+----------+--------------+ SFJ      Full                                                        +---------+---------------+---------+-----------+----------+--------------+ FV Prox  Full                                                        +---------+---------------+---------+-----------+----------+--------------+ FV Mid   Full                                                        +---------+---------------+---------+-----------+----------+--------------+ FV DistalFull                                                        +---------+---------------+---------+-----------+----------+--------------+ PFV      Full                                                        +---------+---------------+---------+-----------+----------+--------------+ POP      Full           Yes      Yes                                 +---------+---------------+---------+-----------+----------+--------------+ PTV      Full                                                        +---------+---------------+---------+-----------+----------+--------------+ PERO     Full                                                        +---------+---------------+---------+-----------+----------+--------------+   +---------+---------------+---------+-----------+----------+--------------+  LEFT      CompressibilityPhasicitySpontaneityPropertiesThrombus Aging +---------+---------------+---------+-----------+----------+--------------+ CFV      Full           Yes      Yes                                 +---------+---------------+---------+-----------+----------+--------------+ SFJ      Full                                                        +---------+---------------+---------+-----------+----------+--------------+ FV Prox  Full                                                        +---------+---------------+---------+-----------+----------+--------------+ FV Mid   Full                                                        +---------+---------------+---------+-----------+----------+--------------+ FV DistalFull                                                        +---------+---------------+---------+-----------+----------+--------------+ PFV      Full                                                        +---------+---------------+---------+-----------+----------+--------------+ POP      Full           Yes      Yes                                 +---------+---------------+---------+-----------+----------+--------------+ PTV      Full                                                        +---------+---------------+---------+-----------+----------+--------------+ PERO     Full                                                        +---------+---------------+---------+-----------+----------+--------------+     Summary: Right: There is no evidence of deep vein thrombosis in the lower extremity. No cystic structure found in the popliteal fossa. Left: There is no evidence of deep vein thrombosis in the lower extremity. No cystic structure found in the popliteal fossa.  *  See table(s) above for measurements and observations. Electronically signed by Monica Martinez MD on 10/31/2018 at 4:35:58 PM.    Final        Subjective:  No  diarrhea since this am. No fevers or chills.  Discharge Exam: Vitals:   11/06/18 0526 11/06/18 0827  BP: (!) 125/54 (!) 144/67  Pulse: 97 (!) 102  Resp: 18 16  Temp: 99.3 F (37.4 C) 99 F (37.2 C)  SpO2: 95% 93%   Vitals:   11/05/18 2238 11/06/18 0135 11/06/18 0526 11/06/18 0827  BP:  (!) 155/75 (!) 125/54 (!) 144/67  Pulse:  100 97 (!) 102  Resp:  16 18 16   Temp: 99.4 F (37.4 C) 98.4 F (36.9 C) 99.3 F (37.4 C) 99 F (37.2 C)  TempSrc: Oral Oral Oral Oral  SpO2:  95% 95% 93%  Weight:      Height:        General: Pt is alert, awake, not in acute distress Cardiovascular: RRR, S1/S2 +,  Respiratory: CTA bilaterally, no wheezing, no rhonchi Abdominal: Soft, NT, ND, bowel sounds + Extremities: pedal edema and tenderness    The results of significant diagnostics from this hospitalization (including imaging, microbiology, ancillary and laboratory) are listed below for reference.     Microbiology: Recent Results (from the past 240 hour(s))  Urine culture     Status: None   Collection Time: 10/28/18  2:38 AM   Specimen: In/Out Cath Urine  Result Value Ref Range Status   Specimen Description IN/OUT CATH URINE  Final   Special Requests NONE  Final   Culture   Final    NO GROWTH Performed at [facility], [address] [place], [place] [address]    Report Status 10/29/2018 FINAL  Final  SARS Coronavirus 2 [facility] order, Performed in [facility] hospital lab) Nasopharyngeal Nasopharyngeal Swab     Status: None   Collection Time: 10/28/18  2:48 AM   Specimen: Nasopharyngeal Swab  Result Value Ref Range Status   SARS Coronavirus 2 NEGATIVE NEGATIVE Final    Comment: (NOTE) If result is NEGATIVE SARS-CoV-2 target nucleic acids are NOT DETECTED. The SARS-CoV-2 RNA is generally detectable in upper and lower  respiratory specimens during the acute phase of infection. The lowest  concentration of SARS-CoV-2 viral copies this assay can detect is 250  copies /  mL. A negative result does not preclude SARS-CoV-2 infection  and should not be used as the sole basis for treatment or other  patient management decisions.  A negative result may occur with  improper specimen collection / handling, submission of specimen other  than nasopharyngeal swab, presence of viral mutation(s) within the  areas targeted by this assay, and inadequate number of viral copies  (<250 copies / mL). A negative result must be combined with clinical  observations, patient history, and epidemiological information. If result is POSITIVE SARS-CoV-2 target nucleic acids are DETECTED. The SARS-CoV-2 RNA is generally detectable in upper and lower  respiratory specimens dur ing the acute phase of infection.  Positive  results are indicative of active infection with SARS-CoV-2.  Clinical  correlation with patient history and other diagnostic information is  necessary to determine patient infection status.  Positive results do  not rule out bacterial infection or co-infection with other viruses. If result is PRESUMPTIVE POSTIVE SARS-CoV-2 nucleic acids MAY BE PRESENT.   A presumptive positive result was obtained on the submitted specimen  and confirmed on repeat testing.  While 2019 novel  coronavirus  (SARS-CoV-2) nucleic acids may be present in the submitted sample  additional confirmatory testing may be necessary for epidemiological  and / or clinical management purposes  to differentiate between  SARS-CoV-2 and other Sarbecovirus currently known to infect humans.  If clinically indicated additional testing with an alternate test  methodology [id number]) is advised. The SARS-CoV-2 RNA is generally  detectable in upper and lower respiratory sp ecimens during the acute  phase of infection. The expected result is Negative. Fact Sheet for Patients:  StrictlyIdeas.no Fact Sheet for Healthcare Providers: BankingDealers.co.za This test is  not yet approved or cleared by the [place] FDA and has been authorized for detection and/or diagnosis of SARS-CoV-2 by FDA under an Emergency Use Authorization (EUA).  This EUA will remain in effect (meaning this test can be used) for the duration of the COVID-19 declaration under Section 564(b)(1) of the Act, 21 U.S.C. section 360bbb-3(b)(1), unless the authorization is terminated or revoked sooner. Performed at [facility], [place] [address] [place], [place] [address]   Culture, blood (routine x 2)     Status: None   Collection Time: 10/28/18  2:51 AM   Specimen: BLOOD  Result Value Ref Range Status   Specimen Description BLOOD RIGHT ARM  Final   Special Requests   Final    BOTTLES DRAWN AEROBIC AND ANAEROBIC Blood Culture results may not be optimal due to an excessive volume of blood received in culture bottles   Culture   Final    NO GROWTH 5 DAYS Performed at [facility], [place] [address], [place], [place] [address]    Report Status 11/02/2018 FINAL  Final  Culture, blood (routine x 2)     Status: None   Collection Time: 10/28/18  3:51 AM   Specimen: BLOOD  Result Value Ref Range Status   Specimen Description BLOOD RIGHT HAND  Final   Special Requests   Final    BOTTLES DRAWN AEROBIC AND ANAEROBIC Blood Culture adequate volume   Culture   Final    NO GROWTH 5 DAYS Performed at [facility], [address] [place], [place] [address]    Report Status 11/02/2018 FINAL  Final  Culture, blood (routine x 2)     Status: None   Collection Time: 10/29/18  6:57 PM   Specimen: BLOOD LEFT HAND  Result Value Ref Range Status   Specimen Description BLOOD LEFT HAND  Final   Special Requests   Final    BOTTLES DRAWN AEROBIC AND ANAEROBIC Blood Culture results may not be optimal due to an inadequate volume of blood received in culture bottles   Culture   Final    NO GROWTH 5 DAYS Performed at [facility], [place] [address], [place], [place] [address]     Report Status 11/03/2018 FINAL  Final  Culture, blood (routine x 2)     Status: None   Collection Time: 10/29/18  7:03 PM   Specimen: BLOOD RIGHT HAND  Result Value Ref Range Status   Specimen Description BLOOD RIGHT HAND  Final   Special Requests   Final    BOTTLES DRAWN AEROBIC AND ANAEROBIC Blood Culture results may not be optimal due to an inadequate volume of blood received in culture bottles   Culture   Final    NO GROWTH 5 DAYS Performed at [facility], [place] [address] [place],  [address]    Report Status 11/03/2018 FINAL  Final  Respiratory  Panel by PCR     Status: None   Collection Time: 10/30/18  5:32 PM   Specimen: Nasopharyngeal Swab; Respiratory  Result Value Ref Range Status   Adenovirus NOT DETECTED NOT DETECTED Final   Coronavirus 229E NOT DETECTED NOT DETECTED Final    Comment: (NOTE) The Coronavirus on the Respiratory Panel, DOES NOT test for the novel  Coronavirus (2019 nCoV)    Coronavirus HKU1 NOT DETECTED NOT DETECTED Final   Coronavirus NL63 NOT DETECTED NOT DETECTED Final   Coronavirus OC43 NOT DETECTED NOT DETECTED Final   Metapneumovirus NOT DETECTED NOT DETECTED Final   Rhinovirus / Enterovirus NOT DETECTED NOT DETECTED Final   Influenza A NOT DETECTED NOT DETECTED Final   Influenza B NOT DETECTED NOT DETECTED Final   Parainfluenza Virus 1 NOT DETECTED NOT DETECTED Final   Parainfluenza Virus 2 NOT DETECTED NOT DETECTED Final   Parainfluenza Virus 3 NOT DETECTED NOT DETECTED Final   Parainfluenza Virus 4 NOT DETECTED NOT DETECTED Final   Respiratory Syncytial Virus NOT DETECTED NOT DETECTED Final   Bordetella pertussis NOT DETECTED NOT DETECTED Final   Chlamydophila pneumoniae NOT DETECTED NOT DETECTED Final   Mycoplasma pneumoniae NOT DETECTED NOT DETECTED Final    Comment: Performed at [facility], [place]. [address] [place], [place] [address]  Novel Coronavirus, NAA (hospital order; send-out to ref lab)     Status: None    Collection Time: 11/03/18  4:47 PM   Specimen: Nasopharyngeal Swab; Respiratory  Result Value Ref Range Status   SARS-CoV-2, NAA NOT DETECTED NOT DETECTED Final    Comment: (NOTE) This nucleic acid amplification test was developed and its performance characteristics determined by [employer]. Nucleic acid amplification tests include PCR and TMA. This test has not been FDA cleared or approved. This test has been authorized by FDA under an Emergency Use Authorization (EUA). This test is only authorized for the duration of time the declaration that circumstances exist justifying the authorization of the emergency use of in vitro diagnostic tests for detection of SARS-CoV-2 virus and/or diagnosis of COVID-19 infection under section 564(b)(1) of the Act, 21 U.S.C. [id number]) (1), unless the authorization is terminated or revoked sooner. When diagnostic testing is negative, the possibility of a false negative result should be considered in the context of a patient's recent exposures and the presence of clinical signs and symptoms consistent with COVID-19. An individual without symptoms of COVID- 19 and who is not shedding SARS-CoV-2 vi rus would expect to have a negative (not detected) result in this assay. Performed At: [facility] [address] [place], [place] [id number] Rush Farmer MD [id number]    [place]  Final    Comment: Performed at [facility], [place] [address] [place], [place] [address]     Labs: BNP (last 3 results) No results for input(s): BNP in the last 8760 hours. Basic Metabolic Panel: Recent Labs  Lab 11/01/18 0713 11/02/18 0511 11/03/18 0430 11/04/18 0409 11/06/18 0839  NA 130* 130* 129* 129* 128*  K 3.6 4.0 4.2 3.5 3.9  CL 96* 98 96* 95* 94*  CO2 23 22 22 25 24   GLUCOSE 94 91 94 135* 93  BUN 12 14 13 21 15   CREATININE 0.64 0.71 0.58 0.69 0.63  CALCIUM 8.5* 8.6* 8.7* 8.4* 8.4*  MG 1.5* 1.7 1.9  --    --    Liver Function Tests: No results for input(s): AST, ALT, ALKPHOS, BILITOT, PROT, ALBUMIN in the last 168  hours. No results for input(s): LIPASE, AMYLASE in the last 168 hours. No results for input(s): AMMONIA in the last 168 hours. CBC: Recent Labs  Lab 10/31/18 0328 11/01/18 0713 11/02/18 0511 11/03/18 0430 11/04/18 0409  WBC 8.0 8.6 9.2 8.9 11.0*  NEUTROABS  --   --  7.4 7.1  --   HGB 9.5* 8.9* 9.0* 9.5* 9.0*  HCT 27.9* 25.3* 26.6* 26.8* 26.4*  MCV 93.6 92.7 94.7 93.1 93.6  PLT 211 219 235 251 283   Cardiac Enzymes: No results for input(s): CKTOTAL, CKMB, CKMBINDEX, TROPONINI in the last 168 hours. BNP: Invalid input(s): POCBNP CBG: No results for input(s): GLUCAP in the last 168 hours. D-Dimer No results for input(s): DDIMER in the last 72 hours. Hgb A1c No results for input(s): HGBA1C in the last 72 hours. Lipid Profile No results for input(s): CHOL, HDL, LDLCALC, TRIG, CHOLHDL, LDLDIRECT in the last 72 hours. Thyroid function studies No results for input(s): TSH, T4TOTAL, T3FREE, THYROIDAB in the last 72 hours.  Invalid input(s): FREET3 Anemia work up No results for input(s): VITAMINB12, FOLATE, FERRITIN, TIBC, IRON, RETICCTPCT in the last 72 hours. Urinalysis    Component Value Date/Time   COLORURINE YELLOW 10/28/2018 0451   APPEARANCEUR CLEAR 10/28/2018 0451   LABSPEC 1.021 10/28/2018 0451   PHURINE 5.0 10/28/2018 0451   GLUCOSEU NEGATIVE 10/28/2018 0451   HGBUR NEGATIVE 10/28/2018 0451   BILIRUBINUR NEGATIVE 10/28/2018 0451   KETONESUR NEGATIVE 10/28/2018 0451   PROTEINUR NEGATIVE 10/28/2018 0451   NITRITE NEGATIVE 10/28/2018 0451   LEUKOCYTESUR NEGATIVE 10/28/2018 0451   Sepsis Labs Invalid input(s): PROCALCITONIN,  WBC,  LACTICIDVEN Microbiology Recent Results (from the past 240 hour(s))  Urine culture     Status: None   Collection Time: 10/28/18  2:38 AM   Specimen: In/Out Cath Urine  Result Value Ref Range Status   Specimen Description  IN/OUT CATH URINE  Final   Special Requests NONE  Final   Culture   Final    NO GROWTH Performed at [facility], [place] [address] [place], [place] [address]    Report Status 10/29/2018 FINAL  Final  SARS Coronavirus 2 [facility] order, Performed in [facility] hospital lab) Nasopharyngeal Nasopharyngeal Swab     Status: None   Collection Time: 10/28/18  2:48 AM   Specimen: Nasopharyngeal Swab  Result Value Ref Range Status   SARS Coronavirus 2 NEGATIVE NEGATIVE Final    Comment: (NOTE) If result is NEGATIVE SARS-CoV-2 target nucleic acids are NOT DETECTED. The SARS-CoV-2 RNA is generally detectable in upper and lower  respiratory specimens during the acute phase of infection. The lowest  concentration of SARS-CoV-2 viral copies this assay can detect is 250  copies / mL. A negative result does not preclude SARS-CoV-2 infection  and should not be used as the sole basis for treatment or other  patient management decisions.  A negative result may occur with  improper specimen collection / handling, submission of specimen other  than nasopharyngeal swab, presence of viral mutation(s) within the  areas targeted by this assay, and inadequate number of viral copies  (<250 copies / mL). A negative result must be combined with clinical  observations, patient history, and epidemiological information. If result is POSITIVE SARS-CoV-2 target nucleic acids are DETECTED. The SARS-CoV-2 RNA is generally detectable in upper and lower  respiratory specimens dur ing the acute phase of infection.  Positive  results are indicative of active infection with SARS-CoV-2.  Clinical  correlation with patient history  and other diagnostic information is  necessary to determine patient infection status.  Positive results do  not rule out bacterial infection or co-infection with other viruses. If result is PRESUMPTIVE POSTIVE SARS-CoV-2 nucleic acids MAY BE PRESENT.   A presumptive positive result was  obtained on the submitted specimen  and confirmed on repeat testing.  While 2019 novel coronavirus  (SARS-CoV-2) nucleic acids may be present in the submitted sample  additional confirmatory testing may be necessary for epidemiological  and / or clinical management purposes  to differentiate between  SARS-CoV-2 and other Sarbecovirus currently known to infect humans.  If clinically indicated additional testing with an alternate test  methodology [phone]) is advised. The SARS-CoV-2 RNA is generally  detectable in upper and lower respiratory sp ecimens during the acute  phase of infection. The expected result is Negative. Fact Sheet for Patients:  StrictlyIdeas.no Fact Sheet for Healthcare Providers: BankingDealers.co.za This test is not yet approved or cleared by the [place] FDA and has been authorized for detection and/or diagnosis of SARS-CoV-2 by FDA under an Emergency Use Authorization (EUA).  This EUA will remain in effect (meaning this test can be used) for the duration of the COVID-19 declaration under Section 564(b)(1) of the Act, 21 U.S.C. section 360bbb-3(b)(1), unless the authorization is terminated or revoked sooner. Performed at [facility], [place] [address], [place], [place] [address]   Culture, blood (routine x 2)     Status: None   Collection Time: 10/28/18  2:51 AM   Specimen: BLOOD  Result Value Ref Range Status   Specimen Description BLOOD RIGHT ARM  Final   Special Requests   Final    BOTTLES DRAWN AEROBIC AND ANAEROBIC Blood Culture results may not be optimal due to an excessive volume of blood received in culture bottles   Culture   Final    NO GROWTH 5 DAYS Performed at [facility], [place] [address] [place], [place] [address]    Report Status 11/02/2018 FINAL  Final  Culture, blood (routine x 2)     Status: None   Collection Time: 10/28/18  3:51 AM   Specimen: BLOOD  Result Value Ref Range  Status   Specimen Description BLOOD RIGHT HAND  Final   Special Requests   Final    BOTTLES DRAWN AEROBIC AND ANAEROBIC Blood Culture adequate volume   Culture   Final    NO GROWTH 5 DAYS Performed at [facility], [address] [place], [place] [address]    Report Status 11/02/2018 FINAL  Final  Culture, blood (routine x 2)     Status: None   Collection Time: 10/29/18  6:57 PM   Specimen: BLOOD LEFT HAND  Result Value Ref Range Status   Specimen Description BLOOD LEFT HAND  Final   Special Requests   Final    BOTTLES DRAWN AEROBIC AND ANAEROBIC Blood Culture results may not be optimal due to an inadequate volume of blood received in culture bottles   Culture   Final    NO GROWTH 5 DAYS Performed at [facility], [place] [address] [place], [place] [address]    Report Status 11/03/2018 FINAL  Final  Culture, blood (routine x 2)     Status: None   Collection Time: 10/29/18  7:03 PM   Specimen: BLOOD RIGHT HAND  Result Value Ref Range Status   Specimen Description BLOOD RIGHT HAND  Final   Special Requests   Final  BOTTLES DRAWN AEROBIC AND ANAEROBIC Blood Culture results may not be optimal due to an inadequate volume of blood received in culture bottles   Culture   Final    NO GROWTH 5 DAYS Performed at [facility], [place] [address] [place], [place] [address]    Report Status 11/03/2018 FINAL  Final  Respiratory Panel by PCR     Status: None   Collection Time: 10/30/18  5:32 PM   Specimen: Nasopharyngeal Swab; Respiratory  Result Value Ref Range Status   Adenovirus NOT DETECTED NOT DETECTED Final   Coronavirus 229E NOT DETECTED NOT DETECTED Final    Comment: (NOTE) The Coronavirus on the Respiratory Panel, DOES NOT test for the novel  Coronavirus (2019 nCoV)    Coronavirus HKU1 NOT DETECTED NOT DETECTED Final   Coronavirus NL63 NOT DETECTED NOT DETECTED Final   Coronavirus OC43 NOT DETECTED NOT DETECTED Final   Metapneumovirus NOT DETECTED NOT DETECTED  Final   Rhinovirus / Enterovirus NOT DETECTED NOT DETECTED Final   Influenza A NOT DETECTED NOT DETECTED Final   Influenza B NOT DETECTED NOT DETECTED Final   Parainfluenza Virus 1 NOT DETECTED NOT DETECTED Final   Parainfluenza Virus 2 NOT DETECTED NOT DETECTED Final   Parainfluenza Virus 3 NOT DETECTED NOT DETECTED Final   Parainfluenza Virus 4 NOT DETECTED NOT DETECTED Final   Respiratory Syncytial Virus NOT DETECTED NOT DETECTED Final   Bordetella pertussis NOT DETECTED NOT DETECTED Final   Chlamydophila pneumoniae NOT DETECTED NOT DETECTED Final   Mycoplasma pneumoniae NOT DETECTED NOT DETECTED Final    Comment: Performed at [facility], [place] [address] [place], [place] [address]  Novel Coronavirus, NAA (hospital order; send-out to ref lab)     Status: None   Collection Time: 11/03/18  4:47 PM   Specimen: Nasopharyngeal Swab; Respiratory  Result Value Ref Range Status   SARS-CoV-2, NAA NOT DETECTED NOT DETECTED Final    Comment: (NOTE) This nucleic acid amplification test was developed and its performance characteristics determined by [employer]. Nucleic acid amplification tests include PCR and TMA. This test has not been FDA cleared or approved. This test has been authorized by FDA under an Emergency Use Authorization (EUA). This test is only authorized for the duration of time the declaration that circumstances exist justifying the authorization of the emergency use of in vitro diagnostic tests for detection of SARS-CoV-2 virus and/or diagnosis of COVID-19 infection under section 564(b)(1) of the Act, 21 U.S.C. [id number]) (1), unless the authorization is terminated or revoked sooner. When diagnostic testing is negative, the possibility of a false negative result should be considered in the context of a patient's recent exposures and the presence of clinical signs and symptoms consistent with COVID-19. An individual without symptoms of COVID- 19 and who is  not shedding SARS-CoV-2 vi rus would expect to have a negative (not detected) result in this assay. Performed At: [facility] [address] [place], [place] [id number] Rush Farmer MD [id number]    [place]  Final    Comment: Performed at [facility], [place] [address], [place],  [address]     Time coordinating discharge: 34 minutes  SIGNED:   Hosie Poisson, MD  [facility] 11/06/2018, 1:40 PM Pager   If 7PM-7AM, please contact night-coverage [email or web address] Password [email or web address]

## 2018-11-06 NOTE — TOC Transition Note (Addendum)
Transition of Care [facility]) - CM/SW Discharge Note   Patient Details  Name: Caitlin Chavez MRN: [id number] Date of Birth: Dec 06, 1934  [facility] CM/SW Contact:  Midge Minium RN, BSN, NCM-BC, ACM-RN [phone] Phone Number: 11/06/2018, 1:36 PM   Clinical Narrative:    Per Dr. Karleen Hampshire, the patient is medically stable to transition to [facility] today. PTAR will be arranged for 1500 today.   Please call report to: [phone]; Room: 602A   Final next level of care: Skilled Nursing Facility Barriers to Discharge: No Barriers Identified   Patient Goals and CMS Choice Patient states their goals for this hospitalization and ongoing recovery are:: family and patient express that the goal is for patient to return to her baseline level of functioning before the fall. CMS Medicare.gov Compare Post Acute Care list provided to:: Patient Represenative (must comment) Choice offered to / list presented to : Adult Children(Tammy [phone])  Discharge Placement              Patient chooses bed at: [facility] Patient to be transferred to facility by: [facility] Name of family member notified: Tammy (daughter-in-law) Patient and family notified of of transfer: 11/06/18  Discharge Plan and Services In-house Referral: Clinical Social Work   Post Acute Care Choice: Skilled Nursing Facility(if recommended by PT)                               Social Determinants of Health (SDOH) Interventions     Readmission Risk Interventions No flowsheet data found.

## 2018-11-06 NOTE — Progress Notes (Signed)
Report called to [facility] at this time

## 2018-11-07 LAB — GI PATHOGEN PANEL BY PCR, STOOL
Adenovirus F 40/41: NOT DETECTED
Astrovirus: NOT DETECTED
Campylobacter by PCR: NOT DETECTED
Cryptosporidium by PCR: NOT DETECTED
Cyclospora cayetanensis: NOT DETECTED
E coli (ETEC) LT/ST: NOT DETECTED
E coli (STEC): NOT DETECTED
Entamoeba histolytica: NOT DETECTED
Enteroaggregative E coli: NOT DETECTED
Enteropathogenic E coli: NOT DETECTED
G lamblia by PCR: DETECTED — AB
Norovirus GI/GII: NOT DETECTED
Plesiomonas shigelloides: NOT DETECTED
Rotavirus A by PCR: NOT DETECTED
Salmonella by PCR: NOT DETECTED
Sapovirus: NOT DETECTED
Shigella by PCR: NOT DETECTED
Vibrio cholerae: NOT DETECTED
Vibrio: NOT DETECTED
Yersinia enterocolitica: NOT DETECTED

## 2018-12-09 ENCOUNTER — Emergency Department (HOSPITAL_COMMUNITY): Payer: Medicare Other

## 2018-12-09 ENCOUNTER — Other Ambulatory Visit: Payer: Self-pay

## 2018-12-09 ENCOUNTER — Inpatient Hospital Stay (HOSPITAL_COMMUNITY)
Admission: EM | Admit: 2018-12-09 | Discharge: 2018-12-15 | DRG: 872 | Disposition: A | Discharge status: Home Health | Payer: Medicare Other | Attending: Internal Medicine | Admitting: Internal Medicine

## 2018-12-09 ENCOUNTER — Encounter (HOSPITAL_COMMUNITY): Payer: Self-pay

## 2018-12-09 DIAGNOSIS — A071 Giardiasis [lambliasis]: Secondary | ICD-10-CM

## 2018-12-09 DIAGNOSIS — Z90710 Acquired absence of both cervix and uterus: Secondary | ICD-10-CM | POA: Diagnosis not present

## 2018-12-09 DIAGNOSIS — Z888 Allergy status to other drugs, medicaments and biological substances status: Secondary | ICD-10-CM

## 2018-12-09 DIAGNOSIS — E871 Hypo-osmolality and hyponatremia: Secondary | ICD-10-CM | POA: Diagnosis present

## 2018-12-09 DIAGNOSIS — A4151 Sepsis due to Escherichia coli [E. coli]: Principal | ICD-10-CM | POA: Diagnosis present

## 2018-12-09 DIAGNOSIS — K219 Gastro-esophageal reflux disease without esophagitis: Secondary | ICD-10-CM | POA: Diagnosis present

## 2018-12-09 DIAGNOSIS — Z7952 Long term (current) use of systemic steroids: Secondary | ICD-10-CM

## 2018-12-09 DIAGNOSIS — I482 Chronic atrial fibrillation, unspecified: Secondary | ICD-10-CM

## 2018-12-09 DIAGNOSIS — Z20828 Contact with and (suspected) exposure to other viral communicable diseases: Secondary | ICD-10-CM | POA: Diagnosis present

## 2018-12-09 DIAGNOSIS — R3915 Urgency of urination: Secondary | ICD-10-CM | POA: Diagnosis present

## 2018-12-09 DIAGNOSIS — Z79899 Other long term (current) drug therapy: Secondary | ICD-10-CM | POA: Diagnosis not present

## 2018-12-09 DIAGNOSIS — G629 Polyneuropathy, unspecified: Secondary | ICD-10-CM

## 2018-12-09 DIAGNOSIS — I10 Essential (primary) hypertension: Secondary | ICD-10-CM | POA: Diagnosis present

## 2018-12-09 DIAGNOSIS — M109 Gout, unspecified: Secondary | ICD-10-CM | POA: Diagnosis present

## 2018-12-09 DIAGNOSIS — B9620 Unspecified Escherichia coli [E. coli] as the cause of diseases classified elsewhere: Secondary | ICD-10-CM | POA: Diagnosis present

## 2018-12-09 DIAGNOSIS — R509 Fever, unspecified: Secondary | ICD-10-CM | POA: Diagnosis not present

## 2018-12-09 DIAGNOSIS — N390 Urinary tract infection, site not specified: Secondary | ICD-10-CM | POA: Diagnosis present

## 2018-12-09 DIAGNOSIS — I4891 Unspecified atrial fibrillation: Secondary | ICD-10-CM | POA: Diagnosis present

## 2018-12-09 DIAGNOSIS — G250 Essential tremor: Secondary | ICD-10-CM | POA: Diagnosis present

## 2018-12-09 DIAGNOSIS — D638 Anemia in other chronic diseases classified elsewhere: Secondary | ICD-10-CM | POA: Diagnosis present

## 2018-12-09 DIAGNOSIS — H3530 Unspecified macular degeneration: Secondary | ICD-10-CM | POA: Diagnosis present

## 2018-12-09 DIAGNOSIS — A419 Sepsis, unspecified organism: Secondary | ICD-10-CM | POA: Diagnosis present

## 2018-12-09 DIAGNOSIS — N10 Acute tubulo-interstitial nephritis: Secondary | ICD-10-CM

## 2018-12-09 DIAGNOSIS — F419 Anxiety disorder, unspecified: Secondary | ICD-10-CM | POA: Diagnosis present

## 2018-12-09 DIAGNOSIS — E876 Hypokalemia: Secondary | ICD-10-CM | POA: Diagnosis present

## 2018-12-09 DIAGNOSIS — D649 Anemia, unspecified: Secondary | ICD-10-CM | POA: Diagnosis present

## 2018-12-09 DIAGNOSIS — Z8739 Personal history of other diseases of the musculoskeletal system and connective tissue: Secondary | ICD-10-CM

## 2018-12-09 HISTORY — DX: Chronic atrial fibrillation, unspecified: I48.20

## 2018-12-09 LAB — COMPREHENSIVE METABOLIC PANEL
ALT: 30 U/L (ref 0–44)
AST: 26 U/L (ref 15–41)
Albumin: 2.6 g/dL — ABNORMAL LOW (ref 3.5–5.0)
Alkaline Phosphatase: 103 U/L (ref 38–126)
Anion gap: 12 (ref 5–15)
BUN: 22 mg/dL (ref 8–23)
CO2: 24 mmol/L (ref 22–32)
Calcium: 8.2 mg/dL — ABNORMAL LOW (ref 8.9–10.3)
Chloride: 93 mmol/L — ABNORMAL LOW (ref 98–111)
Creatinine, Ser: 1.13 mg/dL — ABNORMAL HIGH (ref 0.44–1.00)
GFR calc Af Amer: 52 mL/min — ABNORMAL LOW (ref >60)
GFR calc non Af Amer: 45 mL/min — ABNORMAL LOW (ref >60)
Glucose, Bld: 104 mg/dL — ABNORMAL HIGH (ref 70–99)
Potassium: 3.5 mmol/L (ref 3.5–5.1)
Sodium: 129 mmol/L — ABNORMAL LOW (ref 135–145)
Total Bilirubin: 0.3 mg/dL (ref 0.3–1.2)
Total Protein: 6 g/dL — ABNORMAL LOW (ref 6.5–8.1)

## 2018-12-09 LAB — URINALYSIS, ROUTINE W REFLEX MICROSCOPIC
Bilirubin Urine: NEGATIVE
Glucose, UA: NEGATIVE mg/dL
Ketones, ur: NEGATIVE mg/dL
Nitrite: POSITIVE — AB
Protein, ur: 30 mg/dL — AB
Specific Gravity, Urine: 1.012 (ref 1.005–1.030)
WBC, UA: >50 WBC/hpf — ABNORMAL HIGH (ref 0–5)
pH: 5 (ref 5.0–8.0)

## 2018-12-09 LAB — CBC WITH DIFFERENTIAL/PLATELET
Abs Immature Granulocytes: 0.31 10*3/uL — ABNORMAL HIGH (ref 0.00–0.07)
Basophils Absolute: 0.1 10*3/uL (ref 0.0–0.1)
Basophils Relative: 0 %
Eosinophils Absolute: 0.1 10*3/uL (ref 0.0–0.5)
Eosinophils Relative: 1 %
HCT: 29.7 % — ABNORMAL LOW (ref 36.0–46.0)
Hemoglobin: 9.6 g/dL — ABNORMAL LOW (ref 12.0–15.0)
Immature Granulocytes: 2 %
Lymphocytes Relative: 12 %
Lymphs Abs: 1.7 10*3/uL (ref 0.7–4.0)
MCH: 31 pg (ref 26.0–34.0)
MCHC: 32.3 g/dL (ref 30.0–36.0)
MCV: 95.8 fL (ref 80.0–100.0)
Monocytes Absolute: 0.6 10*3/uL (ref 0.1–1.0)
Monocytes Relative: 5 %
Neutro Abs: 10.6 10*3/uL — ABNORMAL HIGH (ref 1.7–7.7)
Neutrophils Relative %: 80 %
Platelets: 258 10*3/uL (ref 150–400)
RBC: 3.1 MIL/uL — ABNORMAL LOW (ref 3.87–5.11)
RDW: 14.8 % (ref 11.5–15.5)
WBC: 13.4 10*3/uL — ABNORMAL HIGH (ref 4.0–10.5)
nRBC: 0 % (ref 0.0–0.2)

## 2018-12-09 LAB — PROTIME-INR
INR: 1.1 (ref 0.8–1.2)
Prothrombin Time: 14.1 seconds (ref 11.4–15.2)

## 2018-12-09 LAB — CBG MONITORING, ED: Glucose-Capillary: 89 mg/dL (ref 70–99)

## 2018-12-09 LAB — APTT: aPTT: 26 seconds (ref 24–36)

## 2018-12-09 LAB — LACTIC ACID, PLASMA
Lactic Acid, Venous: 1.2 mmol/L (ref 0.5–1.9)
Lactic Acid, Venous: 0.8 mmol/L (ref 0.5–1.9)

## 2018-12-09 MED ORDER — POTASSIUM CHLORIDE CRYS ER 20 MEQ PO TBCR
20.0000 meq | EXTENDED_RELEASE_TABLET | Freq: Once | ORAL | Status: AC
  Administered 2018-12-10: 20 meq via ORAL
  Filled 2018-12-09: qty 1

## 2018-12-09 MED ORDER — PRIMIDONE 50 MG PO TABS
100.0000 mg | ORAL_TABLET | Freq: Every day | ORAL | Status: DC
  Administered 2018-12-10 – 2018-12-14 (×6): 100 mg via ORAL
  Filled 2018-12-09 (×7): qty 2

## 2018-12-09 MED ORDER — NYSTATIN-TRIAMCINOLONE 100000-0.1 UNIT/GM-% EX CREA
1.0000 "application " | TOPICAL_CREAM | Freq: Two times a day (BID) | CUTANEOUS | Status: DC
  Administered 2018-12-10 – 2018-12-15 (×11): 1 via TOPICAL
  Filled 2018-12-09: qty 30

## 2018-12-09 MED ORDER — ALPRAZOLAM 0.5 MG PO TABS: 0.5000 mg | ORAL_TABLET | Freq: Two times a day (BID) | ORAL | Status: DC | PRN

## 2018-12-09 MED ORDER — ACETAMINOPHEN 325 MG PO TABS
650.0000 mg | ORAL_TABLET | Freq: Four times a day (QID) | ORAL | Status: DC | PRN
  Administered 2018-12-14 (×2): 650 mg via ORAL
  Filled 2018-12-09 (×2): qty 2

## 2018-12-09 MED ORDER — ACETAMINOPHEN 325 MG PO TABS
650.0000 mg | ORAL_TABLET | Freq: Once | ORAL | Status: AC
  Administered 2018-12-09: 650 mg via ORAL
  Filled 2018-12-09: qty 2

## 2018-12-09 MED ORDER — PRIMIDONE 50 MG PO TABS
150.0000 mg | ORAL_TABLET | Freq: Every day | ORAL | Status: DC
  Administered 2018-12-10 – 2018-12-15 (×6): 150 mg via ORAL
  Filled 2018-12-09 (×8): qty 3

## 2018-12-09 MED ORDER — VITAMIN D3 25 MCG (1000 UNIT) PO TABS
2000.0000 [IU] | ORAL_TABLET | Freq: Every day | ORAL | Status: DC
  Administered 2018-12-10 – 2018-12-15 (×6): 2000 [IU] via ORAL
  Filled 2018-12-09 (×7): qty 2

## 2018-12-09 MED ORDER — PRIMIDONE 50 MG PO TABS: 100.0000 mg | ORAL_TABLET | Freq: Two times a day (BID) | ORAL | Status: DC

## 2018-12-09 MED ORDER — ALLOPURINOL 100 MG PO TABS
100.0000 mg | ORAL_TABLET | Freq: Two times a day (BID) | ORAL | Status: DC
  Administered 2018-12-10 – 2018-12-15 (×12): 100 mg via ORAL
  Filled 2018-12-09 (×13): qty 1

## 2018-12-09 MED ORDER — MENTHOL (TOPICAL ANALGESIC) 4 % EX GEL: 1.0000 "application " | Freq: Every day | CUTANEOUS | Status: DC

## 2018-12-09 MED ORDER — SODIUM CHLORIDE 0.9 % IV BOLUS (SEPSIS)
1000.0000 mL | Freq: Once | INTRAVENOUS | Status: AC
  Administered 2018-12-09: 1000 mL via INTRAVENOUS

## 2018-12-09 MED ORDER — CARVEDILOL 6.25 MG PO TABS
6.2500 mg | ORAL_TABLET | Freq: Two times a day (BID) | ORAL | Status: DC
  Administered 2018-12-10 – 2018-12-15 (×11): 6.25 mg via ORAL
  Filled 2018-12-09 (×11): qty 1

## 2018-12-09 MED ORDER — SODIUM CHLORIDE 0.9 % IV SOLN
2.0000 g | Freq: Two times a day (BID) | INTRAVENOUS | Status: DC
  Administered 2018-12-10 – 2018-12-12 (×5): 2 g via INTRAVENOUS
  Filled 2018-12-09 (×6): qty 2

## 2018-12-09 MED ORDER — PREDNISONE 20 MG PO TABS
40.0000 mg | ORAL_TABLET | Freq: Every day | ORAL | Status: DC
  Administered 2018-12-10 – 2018-12-14 (×5): 40 mg via ORAL
  Filled 2018-12-09 (×5): qty 2

## 2018-12-09 MED ORDER — SODIUM CHLORIDE 0.9 % IV SOLN
2.0000 g | Freq: Once | INTRAVENOUS | Status: AC
  Administered 2018-12-09: 2 g via INTRAVENOUS
  Filled 2018-12-09: qty 2

## 2018-12-09 MED ORDER — ACETAMINOPHEN 650 MG RE SUPP: 650.0000 mg | Freq: Four times a day (QID) | RECTAL | Status: DC | PRN

## 2018-12-09 MED ORDER — PANTOPRAZOLE SODIUM 40 MG PO TBEC
40.0000 mg | DELAYED_RELEASE_TABLET | Freq: Every day | ORAL | Status: DC
  Administered 2018-12-10 – 2018-12-15 (×6): 40 mg via ORAL
  Filled 2018-12-09 (×6): qty 1

## 2018-12-09 MED ORDER — SODIUM CHLORIDE 0.9 % IV SOLN
1.0000 g | Freq: Once | INTRAVENOUS | Status: DC
  Administered 2018-12-09: 1 g via INTRAVENOUS
  Filled 2018-12-09: qty 10

## 2018-12-09 MED ORDER — GABAPENTIN 300 MG PO CAPS
300.0000 mg | ORAL_CAPSULE | Freq: Two times a day (BID) | ORAL | Status: DC
  Administered 2018-12-10 – 2018-12-15 (×12): 300 mg via ORAL
  Filled 2018-12-09 (×12): qty 1

## 2018-12-09 MED ORDER — PROCHLORPERAZINE EDISYLATE 10 MG/2ML IJ SOLN: 5.0000 mg | INTRAMUSCULAR | Status: DC | PRN

## 2018-12-09 MED ORDER — POTASSIUM CHLORIDE IN NACL 20-0.9 MEQ/L-% IV SOLN
INTRAVENOUS | Status: AC
  Administered 2018-12-09: 22:00:00 via INTRAVENOUS
  Filled 2018-12-09: qty 1000

## 2018-12-09 MED ORDER — SODIUM CHLORIDE 0.9 % IV SOLN
1000.0000 mL | INTRAVENOUS | Status: DC
  Administered 2018-12-09: 1000 mL via INTRAVENOUS

## 2018-12-09 NOTE — Progress Notes (Signed)
ANTICOAGULATION CONSULT NOTE - Initial Consult  Pharmacy Consult for Eliquis Indication: atrial fibrillation  Allergies  Allergen Reactions  . Alka-Seltzer [Aspirin Effervescent] Anaphylaxis and Hives  . Beef-Derived Products Anaphylaxis  . Nsaids Anaphylaxis and Hives  . Other Anaphylaxis and Rash  . Pork-Derived Products Anaphylaxis  . Codeine Hives and Nausea And Vomiting    Patient Measurements: Height: 5\' 5"  (165.1 cm) Weight: 175 lb (79.4 kg) IBW/kg (Calculated) : 57  Vital Signs: Temp: 98.6 F (37 C) (11/10 2255) Temp Source: Rectal (11/10 1855) BP: 100/51 (11/10 2317) Pulse Rate: 73 (11/10 2317)  Labs: Recent Labs    12/09/18 1937  HGB 9.6*  HCT 29.7*  PLT 258  APTT 26  LABPROT 14.1  INR 1.1  CREATININE 1.13*    Estimated Creatinine Clearance: 40 mL/min (A) (by C-G formula based on SCr of 1.13 mg/dL (H)).   Medical History: Past Medical History:  Diagnosis Date  . Anxiety   . Arthritis    hands  . Carpal tunnel syndrome   . Colon polyps   . Diverticulosis   . FUO (fever of unknown origin) 10/31/2018  . GERD (gastroesophageal reflux disease)   . Hiatal hernia   . Hypertension   . Macular degeneration   . Peripheral neuropathy     Medications:  No anticoagulants PTA noted.  Assessment: 77 yoF with new onset AFib.   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  CBC: CBC low but at patient's baseline, Pltc WNL.  No bleeding noted.  Scr 1.13, 79.4kg Patient only meets one criteria for dose reduction (age).   Plan:  Eliquis 5mg  PO BID Monitor for s/sx of bleeding Provide patient education & 30 day free card if plan to continue after discharge  Netta Cedars, PharmD, BCPS 12/09/2018,11:57 PM

## 2018-12-09 NOTE — ED Notes (Signed)
Olevia Bowens MD at bedside

## 2018-12-09 NOTE — Progress Notes (Signed)
Pharmacy Antibiotic Note  Caitlin Chavez is a 84 y.o. female admitted on 12/09/2018 with UTI.  Pharmacy has been consulted for Cefepime dosing.  Tm 101.37F, WBC elevated 13.4, Scr elevated above baseline  Plan: Cefepime 2gm IV q12h Monitor renal function and cx data   Height: 5\' 5"  (165.1 cm) Weight: 175 lb (79.4 kg) IBW/kg (Calculated) : 57  Temp (24hrs), Avg:101.6 F (38.7 C), Min:101.6 F (38.7 C), Max:101.6 F (38.7 C)  Recent Labs  Lab 12/09/18 1937  WBC 13.4*  CREATININE 1.13*  LATICACIDVEN 1.2    Estimated Creatinine Clearance: 40 mL/min (A) (by C-G formula based on SCr of 1.13 mg/dL (H)).    Allergies  Allergen Reactions  . Alka-Seltzer [Aspirin Effervescent] Anaphylaxis and Hives  . Beef-Derived Products Anaphylaxis  . Nsaids Anaphylaxis and Hives  . Other Anaphylaxis and Rash  . Pork-Derived Products Anaphylaxis  . Codeine Hives and Nausea And Vomiting    Antimicrobials this admission: 11/10 Rocephin x1 11/10 Cefepime >>   Dose adjustments this admission:  Microbiology results: 11/10 BCx:  11/10 UCx:   11/10 COVID:   Thank you for allowing pharmacy to be a part of this patient's care.  Netta Cedars, PharmD, BCPS 12/09/2018 10:08 PM

## 2018-12-09 NOTE — ED Notes (Signed)
Olevia Bowens MD made aware pt afib rhythm EKG printed

## 2018-12-09 NOTE — ED Triage Notes (Signed)
Pt arrived via [facility] from home CC weakness and confusion. Pt returned from rehab facility on 11/7 pt family states "jaudice appearance last night". Pt denies pain, NV but endorses diarrhea. Pt reports she was test for COVID at facility and it was negative.    Fever 101 tylenol taken 8 hours ago  20 G right AC 150 ml given in route

## 2018-12-09 NOTE — ED Provider Notes (Signed)
[facility] DEPT Provider Note   CSN: [id number] Arrival date & time: 12/09/18  1841     History   Chief Complaint Chief Complaint  Patient presents with  . Weakness    HPI Caitlin Chavez is a 84 y.o. female.     HPI Patient presents the emergency room for evaluation of fever and weakness.  Patient was admitted to the hospital last month.  She was discharged to a skilled nursing facility.  According to the EMS report, the patient was recently released from the nursing facility.  Patient did have a Foley catheter in place until recently when it was discontinued on October 29.  EMS was called today because the patient appeared confused and had a fever.  Patient is alert and answering my questions in the ED.  She denies any trouble with headache.  No cough.  No abdominal pain.  No chest pain.  No vomiting or diarrhea.  Patient is not sure who called EMS but she is aware that she is in the hospital Past Medical History:  Diagnosis Date  . Anxiety   . Arthritis    hands  . Carpal tunnel syndrome   . Colon polyps   . Diverticulosis   . FUO (fever of unknown origin) 10/31/2018  . GERD (gastroesophageal reflux disease)   . Hiatal hernia   . Hypertension   . Macular degeneration   . Peripheral neuropathy     Patient Active Problem List   Diagnosis Date Noted  . History of gout 11/01/2018  . FUO (fever of unknown origin) 10/31/2018  . Acute bilateral ankle pain 10/31/2018  . Failure to thrive in adult 10/29/2018  . Ankle fracture, right 10/29/2018  . Falls 10/28/2018  . Hiatal hernia 10/28/2018  . Hyponatremia 10/28/2018  . Generalized weakness 10/28/2018  . Essential hypertension 10/28/2018  . Hypoalbuminemia 10/28/2018  . Anxiety 10/28/2018  . Essential tremor 07/09/2017    Past Surgical History:  Procedure Laterality Date  . CATARACT EXTRACTION, BILATERAL    . CHOLECYSTECTOMY    . COLONOSCOPY    . LASIK    . POLYPECTOMY    .  VAGINAL HYSTERECTOMY       OB History   No obstetric history on file.      Home Medications    Prior to Admission medications   Medication Sig Start Date End Date Taking? Authorizing Provider  allopurinol (ZYLOPRIM) 100 MG tablet Take 100 mg by mouth 2 (two) times daily.   Yes [provider]  ALPRAZolam Duanne Moron) 0.5 MG tablet Take 0.5 mg by mouth 2 (two) times daily as needed for anxiety.   Yes [provider]  benazepril-hydrochlorthiazide (LOTENSIN HCT) 20-25 MG tablet Take 1 tablet by mouth daily.  06/27/17  Yes [provider]  Calcium Carbonate-Vitamin D (CALCIUM 600+D PO) Take 1 tablet by mouth daily.    Yes [provider]  carvedilol (COREG) 6.25 MG tablet Take 6.25 mg by mouth 2 (two) times daily with a meal.  07/11/15  Yes [provider]  cholecalciferol (VITAMIN D) 25 MCG (1000 UT) tablet Take 2,000 Units by mouth daily.   Yes [provider]  EPINEPHrine 0.3 mg/0.3 mL IJ SOAJ injection Inject 0.3 mLs (0.3 mg total) into the skin once as needed (for allergic reaction). 10/22/16  Yes Upstill, Nehemiah Settle, PA-C  feeding supplement, ENSURE ENLIVE, (ENSURE ENLIVE) LIQD Take 237 mLs by mouth 2 (two) times daily between meals. 11/06/18  Yes Hosie Poisson, MD  gabapentin (  NEURONTIN) 300 MG capsule Take 300 mg by mouth 2 (two) times daily. 07/11/15  Yes [provider]  loperamide (IMODIUM) 2 MG capsule Take 1 capsule (2 mg total) by mouth as needed for diarrhea or loose stools. 11/06/18  Yes Hosie Poisson, MD  Menthol, Topical Analgesic, (BIOFREEZE) 4 % GEL Apply 1 application topically daily. Both knees   Yes [provider]  Multiple Vitamins-Minerals (OCUVITE PO) Take 1 tablet by mouth daily.    Yes [provider]  nystatin-triamcinolone (MYCOLOG II) cream Apply 1 application topically 2 (two) times daily.   Yes [provider]  pantoprazole (PROTONIX) 40 MG tablet Take 1 tablet (40 mg total) by mouth  daily. 11/07/18  Yes Hosie Poisson, MD  predniSONE (DELTASONE) 20 MG tablet Take 2 tablets (40 mg total) by mouth daily before breakfast. 11/06/18  Yes Hosie Poisson, MD  primidone (MYSOLINE) 50 MG tablet 3 in AM, 2 in PM Patient taking differently: Take 100-150 mg by mouth 2 (two) times daily. 3 in AM, 2 in PM 05/30/18  Yes Tat, Eustace Quail, DO  vitamin B-12 (CYANOCOBALAMIN) 100 MCG tablet Take 100 mcg by mouth daily.   Yes [provider]  tamsulosin (FLOMAX) 0.4 MG CAPS capsule Take 1 capsule (0.4 mg total) by mouth daily. Patient not taking: Reported on 12/09/2018 11/07/18   Hosie Poisson, MD    Family History Family History  Problem Relation Age of Onset  . Heart attack Father 47  . Alcohol abuse Father   . Heart failure Mother 24       chf  . Heart disease Mother   . Stomach cancer Maternal Grandmother   . Prostate cancer Son   . Colon cancer Neg Hx     Social History Social History   Tobacco Use  . Smoking status: Never Smoker  . Smokeless tobacco: Never Used  Substance Use Topics  . Alcohol use: No  . Drug use: No     Allergies   Alka-seltzer [aspirin effervescent], Beef-derived products, Nsaids, Other, Pork-derived products, and Codeine   Review of Systems Review of Systems  All other systems reviewed and are negative.    Physical Exam Updated Vital Signs BP 118/69   Pulse (!) 39   Temp (!) 101.6 F (38.7 C) (Rectal)   Resp 15   Ht 1.651 m (5\' 5" )   Wt 79.4 kg   SpO2 98%   BMI 29.12 kg/m   Physical Exam Vitals signs and nursing note reviewed.  Constitutional:      General: She is not in acute distress.    Comments: Elderly, frail  HENT:     Head: Normocephalic and atraumatic.     Right Ear: External ear normal.     Left Ear: External ear normal.  Eyes:     General: No scleral icterus.       Right eye: No discharge.        Left eye: No discharge.     Conjunctiva/sclera: Conjunctivae normal.  Neck:     Musculoskeletal: Neck supple.      Trachea: No tracheal deviation.  Cardiovascular:     Rate and Rhythm: Normal rate and regular rhythm.  Pulmonary:     Effort: Pulmonary effort is normal. No respiratory distress.     Breath sounds: Normal breath sounds. No stridor. No wheezing or rales.  Abdominal:     General: Bowel sounds are normal. There is no distension.     Palpations: Abdomen is soft.  Tenderness: There is no abdominal tenderness. There is no guarding or rebound.  Musculoskeletal:        General: No tenderness.  Skin:    General: Skin is warm and dry.     Findings: No rash.  Neurological:     Mental Status: She is alert.     Cranial Nerves: No cranial nerve deficit (no facial droop, extraocular movements intact, no slurred speech).     Sensory: No sensory deficit.     Motor: No abnormal muscle tone or seizure activity.     Coordination: Coordination normal.     Comments: Patient is able to lift both legs off the bed, she is able to squeeze my fingers, patient is aware that she is in the hospital and the year is 2020      ED Treatments / Results  Labs (all labs ordered are listed, but only abnormal results are displayed) Labs Reviewed  COMPREHENSIVE METABOLIC PANEL - Abnormal; Notable for the following components:      Result Value   Sodium 129 (*)    Chloride 93 (*)    Glucose, Bld 104 (*)    Creatinine, Ser 1.13 (*)    Calcium 8.2 (*)    Total Protein 6.0 (*)    Albumin 2.6 (*)    GFR calc non Af Amer 45 (*)    GFR calc Af Amer 52 (*)    All other components within normal limits  CBC WITH DIFFERENTIAL/PLATELET - Abnormal; Notable for the following components:   WBC 13.4 (*)    RBC 3.10 (*)    Hemoglobin 9.6 (*)    HCT 29.7 (*)    Neutro Abs 10.6 (*)    Abs Immature Granulocytes 0.31 (*)    All other components within normal limits  URINALYSIS, ROUTINE W REFLEX MICROSCOPIC - Abnormal; Notable for the following components:   APPearance CLOUDY (*)    Hgb urine dipstick SMALL (*)     Protein, ur 30 (*)    Nitrite POSITIVE (*)    Leukocytes,Ua LARGE (*)    WBC, UA >50 (*)    Bacteria, UA MANY (*)    All other components within normal limits  CULTURE, BLOOD (ROUTINE X 2)  CULTURE, BLOOD (ROUTINE X 2)  URINE CULTURE  SARS CORONAVIRUS 2 (TAT 6-24 HRS)  LACTIC ACID, PLASMA  APTT  PROTIME-INR  LACTIC ACID, PLASMA  CBG MONITORING, ED    EKG EKG Interpretation  Date/Time:  Tuesday December 09 2018 18:56:38 EST Ventricular Rate:  98 PR Interval:    QRS Duration: 75 QT Interval:  329 QTC Calculation: 420 R Axis:   21 Text Interpretation: sinus tahcycardia with PAC Low voltage, precordial leads Confirmed by Dorie Rank [phone]) on 12/09/2018 7:14:59 PM   Radiology Dg Chest Port 1 View  Result Date: 12/09/2018 CLINICAL DATA:  Fever. Weakness and confusion. EXAM: PORTABLE CHEST 1 VIEW COMPARISON:  Radiograph 10/28/2018, CT 08/12/2015 FINDINGS: Upper normal heart size. Unchanged mediastinal contours. There is a retrocardiac hiatal hernia. Linear scarring at the left lung base. Pulmonary vasculature is normal. No consolidation, pleural effusion, or pneumothorax. No acute osseous abnormalities are seen. IMPRESSION: 1. No acute abnormality. 2. Retrocardiac hiatal hernia with linear scarring at the left lung base. Electronically Signed   By: Keith Rake M.D.   On: 12/09/2018 19:45    Procedures Procedures (including critical care time)  Medications Ordered in ED Medications  sodium chloride 0.9 % bolus 1,000 mL (0 mLs Intravenous Stopped 12/09/18 2055)  Followed by  0.9 %  sodium chloride infusion (1,000 mLs Intravenous New Bag/Given 12/09/18 2055)  cefTRIAXone (ROCEPHIN) 1 g in sodium chloride 0.9 % 100 mL IVPB (1 g Intravenous New Bag/Given 12/09/18 2116)  acetaminophen (TYLENOL) tablet 650 mg (650 mg Oral Given 12/09/18 1944)     Initial Impression / Assessment and Plan / ED Course  I have reviewed the triage vital signs and the nursing notes.   Pertinent labs & imaging results that were available during my care of the patient were reviewed by me and considered in my medical decision making (see chart for details).  Clinical Course as of Dec 08 2117  Tue Dec 09, 2018  2110 Hyponatremia is stable.  Comprehensive metabolic panel(!) [JK]  99991111 Anemia is stable.  Leukocytosis is increased  CBC WITH DIFFERENTIAL(!) [JK]  2111 Consistent with UTI  Urinalysis, Routine w reflex microscopic(!) [JK]    Clinical Course User Index [JK] Dorie Rank, MD     Patient presented to the emergency room for evaluation of fever and weakness.  Family states she had a rather abrupt onset of her symptoms.  Patient's ED work-up is notable for urinary tract infection.  Otherwise hemodynamically stable and no signs of severe sepsis.  Patient was started on IV antibiotics.  Considering her weakness and lethargy plan on admission for further treatment.  Final Clinical Impressions(s) / ED Diagnoses   Final diagnoses:  Acute pyelonephritis  Hyponatremia      Dorie Rank, MD 12/09/18 2119

## 2018-12-09 NOTE — H&P (Addendum)
History and Physical    Caitlin Chavez [id number] DOB: 1934-10-09 DOA: 12/09/2018  PCP: Sueanne Margarita, DO   Patient coming from: Home.  I have personally briefly reviewed patient's old medical records in [facility]  Chief Complaint: Weakness and confusion.  HPI: Caitlin Chavez is a 84 y.o. female with medical history significant of anxiety, osteoarthritis of the hands, carpal tunnel syndrome, colon polyps, diverticulosis, GERD, hiatal hernia, hypertension, macular degeneration, peripheral neuropathy who was recently admitted for FUO on 10/29/2018, discharged to a rehab facility, returning home on on 12/06/2018 now coming to the ED due to fever, weakness, confusion and diarrhea.  She answers simple questions, but is unable to elaborate.  Her son provides most of the history.  He mentions that she was well for the first day and a half, then started having significant urinary urgency on Saturday evening.  She has some loose stool episodes since yesterday.  She has had a fever and earlier today took Tylenol for fever at home.  When asked, she denies headache, chest pain, abdominal, back or joint pain at this time.  ED Course: Her initial vital signs temperature 101.6 F, pulse 105, respirations 16, blood pressure 134/66 mmHg and O2 sat 97% on room air.  The patient received a 1000 mL NS bolus.  Urinalysis was cloudy, with small hemoglobinuria, proteinuria 30 mg/dL, positive nitrites, large leukocyte esterase, 0-5 RBC and more than 50 WBC per hpf with many bacteria.  Her CBC showed a white count was 13.4 with 80% neutrophils, 12% lymphocytes and 5% monocytes.  Hemoglobin 9.6 g/dL and platelets 258.  PT was 14.1, INR 1.1 and APTT 26.  Lactic acid was 1.2 mmol/L.  Sodium 129, potassium 3.5, chloride 93 and CO2 24 mmol/L.  BUN 22, creatinine 1.13, glucose 104 and calcium 8.2 mg/dL.  Total protein 6.0 and albumin 2.6 g/dL.  The rest of the hepatic function tests are within normal limits.  Review  of Systems: As per HPI otherwise 10 point review of systems negative.   Past Medical History:  Diagnosis Date  . Anxiety   . Arthritis    hands  . Carpal tunnel syndrome   . Colon polyps   . Diverticulosis   . FUO (fever of unknown origin) 10/31/2018  . GERD (gastroesophageal reflux disease)   . Hiatal hernia   . Hypertension   . Macular degeneration   . Peripheral neuropathy     Past Surgical History:  Procedure Laterality Date  . CATARACT EXTRACTION, BILATERAL    . CHOLECYSTECTOMY    . COLONOSCOPY    . LASIK    . POLYPECTOMY    . VAGINAL HYSTERECTOMY       reports that she has never smoked. She has never used smokeless tobacco. She reports that she does not drink alcohol or use drugs.  Allergies  Allergen Reactions  . Alka-Seltzer [Aspirin Effervescent] Anaphylaxis and Hives  . Beef-Derived Products Anaphylaxis  . Nsaids Anaphylaxis and Hives  . Other Anaphylaxis and Rash  . Pork-Derived Products Anaphylaxis  . Codeine Hives and Nausea And Vomiting    Family History  Problem Relation Age of Onset  . Heart attack Father 65  . Alcohol abuse Father   . Heart failure Mother 30       chf  . Heart disease Mother   . Stomach cancer Maternal Grandmother   . Prostate cancer Son   . Colon cancer Neg Hx    Prior to Admission medications   Medication Sig  Start Date End Date Taking? Authorizing Provider  allopurinol (ZYLOPRIM) 100 MG tablet Take 100 mg by mouth 2 (two) times daily.   Yes [provider]  ALPRAZolam Duanne Moron) 0.5 MG tablet Take 0.5 mg by mouth 2 (two) times daily as needed for anxiety.   Yes [provider]  benazepril-hydrochlorthiazide (LOTENSIN HCT) 20-25 MG tablet Take 1 tablet by mouth daily.  06/27/17  Yes [provider]  Calcium Carbonate-Vitamin D (CALCIUM 600+D PO) Take 1 tablet by mouth daily.    Yes [provider]  carvedilol (COREG) 6.25 MG tablet Take 6.25 mg by mouth 2 (two) times daily with a meal.  07/11/15   Yes [provider]  cholecalciferol (VITAMIN D) 25 MCG (1000 UT) tablet Take 2,000 Units by mouth daily.   Yes [provider]  EPINEPHrine 0.3 mg/0.3 mL IJ SOAJ injection Inject 0.3 mLs (0.3 mg total) into the skin once as needed (for allergic reaction). 10/22/16  Yes Upstill, Nehemiah Settle, PA-C  feeding supplement, ENSURE ENLIVE, (ENSURE ENLIVE) LIQD Take 237 mLs by mouth 2 (two) times daily between meals. 11/06/18  Yes Hosie Poisson, MD  gabapentin (NEURONTIN) 300 MG capsule Take 300 mg by mouth 2 (two) times daily. 07/11/15  Yes [provider]  loperamide (IMODIUM) 2 MG capsule Take 1 capsule (2 mg total) by mouth as needed for diarrhea or loose stools. 11/06/18  Yes Hosie Poisson, MD  Menthol, Topical Analgesic, (BIOFREEZE) 4 % GEL Apply 1 application topically daily. Both knees   Yes [provider]  Multiple Vitamins-Minerals (OCUVITE PO) Take 1 tablet by mouth daily.    Yes [provider]  nystatin-triamcinolone (MYCOLOG II) cream Apply 1 application topically 2 (two) times daily.   Yes [provider]  pantoprazole (PROTONIX) 40 MG tablet Take 1 tablet (40 mg total) by mouth daily. 11/07/18  Yes Hosie Poisson, MD  predniSONE (DELTASONE) 20 MG tablet Take 2 tablets (40 mg total) by mouth daily before breakfast. 11/06/18  Yes Hosie Poisson, MD  primidone (MYSOLINE) 50 MG tablet 3 in AM, 2 in PM Patient taking differently: Take 100-150 mg by mouth 2 (two) times daily. 3 in AM, 2 in PM 05/30/18  Yes Tat, Eustace Quail, DO  vitamin B-12 (CYANOCOBALAMIN) 100 MCG tablet Take 100 mcg by mouth daily.   Yes [provider]  tamsulosin (FLOMAX) 0.4 MG CAPS capsule Take 1 capsule (0.4 mg total) by mouth daily. Patient not taking: Reported on 12/09/2018 11/07/18   Hosie Poisson, MD    Physical Exam: Vitals:   12/09/18 1909 12/09/18 1942 12/09/18 2015 12/09/18 2055  BP:  117/81 118/69 (!) 114/56  Pulse:  (!) 106 (!) 39 96  Resp:  (!) 21 15 15   Temp:       TempSrc:      SpO2:  100% 98%   Weight: 79.4 kg     Height:        Constitutional: NAD, calm, comfortable Eyes: PERRL, lids and conjunctivae normal ENMT: Mucous membranes are mildly dry. Posterior pharynx clear of any exudate or lesions. Neck: normal, supple, no masses, no thyromegaly Respiratory: Decreased breath sounds in bases, otherwise clear to auscultation bilaterally, no wheezing, no crackles. Normal respiratory effort. No accessory muscle use.  Cardiovascular: Irregularly irregular, tachycardic at 106 bpm, no murmurs / rubs / gallops. No extremity edema. 2+ pedal pulses. No carotid bruits.  Abdomen: Nondistended.  Bowel sounds positive.  Soft, tenderness, no masses palpated. No hepatosplenomegaly.  No flank tenderness. Musculoskeletal: no clubbing /  cyanosis. Good ROM, no contractures. Normal muscle tone.  Skin: no rashes, lesions, ulcers on limited dermatological examination. Neurologic: CN 2-12 grossly intact. Sensation intact, DTR normal.  Moderate generalized weakness. Psychiatric:  Alert and oriented x 2, knows she is in the hospital.  Disoriented to time.  Labs on Admission: I have personally reviewed following labs and imaging studies  CBC: Recent Labs  Lab 12/09/18 1937  WBC 13.4*  NEUTROABS 10.6*  HGB 9.6*  HCT 29.7*  MCV 95.8  PLT 0000000   Basic Metabolic Panel: Recent Labs  Lab 12/09/18 1937  NA 129*  K 3.5  CL 93*  CO2 24  GLUCOSE 104*  BUN 22  CREATININE 1.13*  CALCIUM 8.2*   GFR: Estimated Creatinine Clearance: 40 mL/min (A) (by C-G formula based on SCr of 1.13 mg/dL (H)). Liver Function Tests: Recent Labs  Lab 12/09/18 1937  AST 26  ALT 30  ALKPHOS 103  BILITOT 0.3  PROT 6.0*  ALBUMIN 2.6*   No results for input(s): LIPASE, AMYLASE in the last 168 hours. No results for input(s): AMMONIA in the last 168 hours. Coagulation Profile: Recent Labs  Lab 12/09/18 1937  INR 1.1   Cardiac Enzymes: No results for input(s): CKTOTAL, CKMB,  CKMBINDEX, TROPONINI in the last 168 hours. BNP (last 3 results) No results for input(s): PROBNP in the last 8760 hours. HbA1C: No results for input(s): HGBA1C in the last 72 hours. CBG: Recent Labs  Lab 12/09/18 1904  GLUCAP 89   Lipid Profile: No results for input(s): CHOL, HDL, LDLCALC, TRIG, CHOLHDL, LDLDIRECT in the last 72 hours. Thyroid Function Tests: No results for input(s): TSH, T4TOTAL, FREET4, T3FREE, THYROIDAB in the last 72 hours. Anemia Panel: No results for input(s): VITAMINB12, FOLATE, FERRITIN, TIBC, IRON, RETICCTPCT in the last 72 hours. Urine analysis:    Component Value Date/Time   COLORURINE YELLOW 12/09/2018 1937   APPEARANCEUR CLOUDY (A) 12/09/2018 1937   LABSPEC 1.012 12/09/2018 1937   PHURINE 5.0 12/09/2018 1937   GLUCOSEU NEGATIVE 12/09/2018 1937   HGBUR SMALL (A) 12/09/2018 1937   BILIRUBINUR NEGATIVE 12/09/2018 1937   KETONESUR NEGATIVE 12/09/2018 1937   PROTEINUR 30 (A) 12/09/2018 1937   NITRITE POSITIVE (A) 12/09/2018 1937   LEUKOCYTESUR LARGE (A) 12/09/2018 1937    Radiological Exams on Admission: Dg Chest Port 1 View  Result Date: 12/09/2018 CLINICAL DATA:  Fever. Weakness and confusion. EXAM: PORTABLE CHEST 1 VIEW COMPARISON:  Radiograph 10/28/2018, CT 08/12/2015 FINDINGS: Upper normal heart size. Unchanged mediastinal contours. There is a retrocardiac hiatal hernia. Linear scarring at the left lung base. Pulmonary vasculature is normal. No consolidation, pleural effusion, or pneumothorax. No acute osseous abnormalities are seen. IMPRESSION: 1. No acute abnormality. 2. Retrocardiac hiatal hernia with linear scarring at the left lung base. Electronically Signed   By: Keith Rake M.D.   On: 12/09/2018 19:45   04/18/2018 echocardiogram  IMPRESSIONS   1. Left ventricular ejection fraction, by visual estimation, is 65 to 70%. The left ventricle has hyperdynamic function. Normal left ventricular size. There is moderately increased left  ventricular hypertrophy.  2. Left ventricular diastolic Doppler parameters are indeterminate pattern of LV diastolic filling.  3. Global right ventricle has normal systolic function.The right ventricular size is normal. No increase in right ventricular wall thickness.  4. Left atrial size was mildly dilated.  5. Right atrial size was normal.  6. Presence of pericardial fat pad.  7. Small pericardial effusion.  8. The pericardial effusion is posterior to the left ventricle.  9. Mild aortic valve annular calcification. 10. Mild mitral annular calcification. 11. The mitral valve is grossly normal. Trace mitral valve regurgitation. 12. The tricuspid valve is grossly normal. Tricuspid valve regurgitation is mild. 13. The aortic valve is tricuspid Aortic valve regurgitation was not visualized by color flow Doppler. 14. The pulmonic valve was grossly normal. Pulmonic valve regurgitation is not visualized by color flow Doppler. 15. Moderately elevated pulmonary artery systolic pressure. 16. The inferior vena cava is normal in size with greater than 50% respiratory variability, suggesting right atrial pressure of 3 mmHg  EKG: Independently reviewed.  Vent. rate 98 BPM PR interval * ms QRS duration 75 ms QT/QTc 329/420 ms P-R-T axes * 21 60 sinus tahcycardia with PAC Low voltage, precordial leads  Assessment/Plan Principal Problem:   Sepsis secondary to UTI ([place]) Admit to telemetry/inpatient. Continue gentle/time-limited IVF. Cefepime per pharmacy. Follow-up urine culture and sensitivity. Follow-up blood culture and sensitivity.  Active Problems:   Atrial fibrillation with RVR ([place]) This is a new finding according to her son. CHA?DS?-VASc Score of at least 5  (Age, female, HTN, aortic atherosclerosis on CT). Rate has improved after hydration. BP is soft. So no BB or CCB for now. Optimize electrolytes and CV status. Apixaban per pharmacy due to severe heparin allergy.     Hypomagnesemia Replacing. Follow up Mg level as needed.    Essential tremor Continue primidone.    Hyponatremia Time-limited NS infusion. Hold hydrochlorothiazide. Follow-up sodium level.    Essential hypertension Hold benazepril and HCTZ. Continue carvedilol 6.25 mg p.o. twice daily. Monitor BP, HR, renal function electrolytes.    Anxiety Continue alprazolam twice daily as needed.    History of gout Continue allopurinol 100 mg p.o. twice daily.    GERD (gastroesophageal reflux disease) Continue PPI.    Peripheral neuropathy Continue gabapentin.    Anemia Check anemia panel. Monitor H&H.   DVT prophylaxis: SCDs. Code Status: Full code. Family Communication: Disposition Plan: Admit for IV antibiotic therapy for 2 to 3 days. Consults called: Admission status: Inpatient/telemetry.   Reubin Milan MD [facility]  If 7PM-7AM, please contact night-coverage [email or web address]  12/09/2018, 9:39 PM   This document was prepared using Dragon voice recognition software and may contain some unintended transcription errors.

## 2018-12-10 DIAGNOSIS — I4891 Unspecified atrial fibrillation: Secondary | ICD-10-CM

## 2018-12-10 LAB — FOLATE: Folate: 19.7 ng/mL (ref >5.9)

## 2018-12-10 LAB — CBC WITH DIFFERENTIAL/PLATELET
Abs Immature Granulocytes: 0.16 10*3/uL — ABNORMAL HIGH (ref 0.00–0.07)
Basophils Absolute: 0 10*3/uL (ref 0.0–0.1)
Basophils Relative: 0 %
Eosinophils Absolute: 0.1 10*3/uL (ref 0.0–0.5)
Eosinophils Relative: 1 %
HCT: 28.3 % — ABNORMAL LOW (ref 36.0–46.0)
Hemoglobin: 9 g/dL — ABNORMAL LOW (ref 12.0–15.0)
Immature Granulocytes: 1 %
Lymphocytes Relative: 9 %
Lymphs Abs: 1.1 10*3/uL (ref 0.7–4.0)
MCH: 31.3 pg (ref 26.0–34.0)
MCHC: 31.8 g/dL (ref 30.0–36.0)
MCV: 98.3 fL (ref 80.0–100.0)
Monocytes Absolute: 0.5 10*3/uL (ref 0.1–1.0)
Monocytes Relative: 4 %
Neutro Abs: 10 10*3/uL — ABNORMAL HIGH (ref 1.7–7.7)
Neutrophils Relative %: 85 %
Platelets: 226 10*3/uL (ref 150–400)
RBC: 2.88 MIL/uL — ABNORMAL LOW (ref 3.87–5.11)
RDW: 14.9 % (ref 11.5–15.5)
WBC: 11.8 10*3/uL — ABNORMAL HIGH (ref 4.0–10.5)
nRBC: 0 % (ref 0.0–0.2)

## 2018-12-10 LAB — COMPREHENSIVE METABOLIC PANEL
ALT: 21 U/L (ref 0–44)
AST: 16 U/L (ref 15–41)
Albumin: 2.3 g/dL — ABNORMAL LOW (ref 3.5–5.0)
Alkaline Phosphatase: 81 U/L (ref 38–126)
Anion gap: 11 (ref 5–15)
BUN: 14 mg/dL (ref 8–23)
CO2: 18 mmol/L — ABNORMAL LOW (ref 22–32)
Calcium: 7.8 mg/dL — ABNORMAL LOW (ref 8.9–10.3)
Chloride: 101 mmol/L (ref 98–111)
Creatinine, Ser: 0.76 mg/dL (ref 0.44–1.00)
GFR calc Af Amer: >60 mL/min (ref >60)
GFR calc non Af Amer: >60 mL/min (ref >60)
Glucose, Bld: 163 mg/dL — ABNORMAL HIGH (ref 70–99)
Potassium: 3.3 mmol/L — ABNORMAL LOW (ref 3.5–5.1)
Sodium: 130 mmol/L — ABNORMAL LOW (ref 135–145)
Total Bilirubin: 1 mg/dL (ref 0.3–1.2)
Total Protein: 5.3 g/dL — ABNORMAL LOW (ref 6.5–8.1)

## 2018-12-10 LAB — C DIFFICILE QUICK SCREEN W PCR REFLEX
C Diff antigen: NEGATIVE
C Diff interpretation: NOT DETECTED
C Diff toxin: NEGATIVE

## 2018-12-10 LAB — MAGNESIUM: Magnesium: 1.3 mg/dL — ABNORMAL LOW (ref 1.7–2.4)

## 2018-12-10 LAB — IRON AND TIBC
Iron: 11 ug/dL — ABNORMAL LOW (ref 28–170)
Saturation Ratios: 7 % — ABNORMAL LOW (ref 10.4–31.8)
TIBC: 158 ug/dL — ABNORMAL LOW (ref 250–450)
UIBC: 147 ug/dL

## 2018-12-10 LAB — RETICULOCYTES
Immature Retic Fract: 17.6 % — ABNORMAL HIGH (ref 2.3–15.9)
RBC.: 2.88 MIL/uL — ABNORMAL LOW (ref 3.87–5.11)
Retic Count, Absolute: 45.8 10*3/uL (ref 19.0–186.0)
Retic Ct Pct: 1.6 % (ref 0.4–3.1)

## 2018-12-10 LAB — VITAMIN B12: Vitamin B-12: 789 pg/mL (ref 180–914)

## 2018-12-10 LAB — FERRITIN: Ferritin: 92 ng/mL (ref 11–307)

## 2018-12-10 LAB — SARS CORONAVIRUS 2 (TAT 6-24 HRS): SARS Coronavirus 2: NEGATIVE

## 2018-12-10 LAB — PHOSPHORUS: Phosphorus: 3.3 mg/dL (ref 2.5–4.6)

## 2018-12-10 MED ORDER — SODIUM CHLORIDE 0.9 % IV SOLN
INTRAVENOUS | Status: DC | PRN
  Administered 2018-12-10: 21:00:00 250 mL via INTRAVENOUS

## 2018-12-10 MED ORDER — APIXABAN 5 MG PO TABS
5.0000 mg | ORAL_TABLET | Freq: Two times a day (BID) | ORAL | Status: DC
  Administered 2018-12-10 – 2018-12-15 (×12): 5 mg via ORAL
  Filled 2018-12-10 (×12): qty 1

## 2018-12-10 MED ORDER — MUSCLE RUB 10-15 % EX CREA
TOPICAL_CREAM | Freq: Every day | CUTANEOUS | Status: DC
  Administered 2018-12-10: 10:00:00 via TOPICAL
  Administered 2018-12-11: 1 via TOPICAL
  Administered 2018-12-12 – 2018-12-14 (×3): via TOPICAL
  Administered 2018-12-15: 1 via TOPICAL
  Filled 2018-12-10 (×2): qty 85

## 2018-12-10 MED ORDER — MAGNESIUM SULFATE 4 GM/100ML IV SOLN
4.0000 g | Freq: Once | INTRAVENOUS | Status: AC
  Administered 2018-12-10: 02:00:00 4 g via INTRAVENOUS
  Filled 2018-12-10: qty 100

## 2018-12-10 MED ORDER — POTASSIUM CHLORIDE 20 MEQ PO PACK
40.0000 meq | PACK | Freq: Once | ORAL | Status: AC
  Administered 2018-12-10: 16:00:00 40 meq via ORAL
  Filled 2018-12-10: qty 2

## 2018-12-10 NOTE — Discharge Instructions (Signed)

## 2018-12-10 NOTE — Progress Notes (Signed)
PROGRESS NOTE    Caitlin Chavez  [id number] DOB: [birth year] DOA: 12/09/2018 PCP: Sueanne Margarita, DO    Brief Narrative:  Caitlin Chavez is a 84 y.o. female with medical history significant of anxiety, osteoarthritis of the hands, carpal tunnel syndrome, colon polyps, diverticulosis, GERD, hiatal hernia, hypertension, macular degeneration, peripheral neuropathy who was recently admitted for FUO on 10/29/2018, discharged to a rehab facility, returning home on on 12/06/2018 now presented to the ED due to fever, weakness, confusion and diarrhea. Per son she was well for the first day and a half, then started having significant urinary urgency on Saturday evening.  She has some loose stool episodes.  She has had a fever and earlier today took Tylenol for fever at home.   ED Course: Her initial vital signs temperature 101.6 F, pulse 105, respirations 16, blood pressure 134/66 mmHg and O2 sat 97% on room air. The patient received a 1000 mL NS bolus.  Urinalysis was cloudy, with small hemoglobinuria, proteinuria 30 mg/dL, positive nitrites, large leukocyte esterase, 0-5 RBC and more than 50 WBC per hpf with many bacteria.  Her CBC showed a white count was 13.4 with 80% neutrophils, 12% lymphocytes and 5% monocytes.  Hemoglobin 9.6 g/dL and platelets 258.  PT was 14.1, INR 1.1 and APTT 26.  Lactic acid was 1.2 mmol/L.  Sodium 129, potassium 3.5, chloride 93 and CO2 24 mmol/L.  BUN 22, creatinine 1.13, glucose 104 and calcium 8.2 mg/dL.  Total protein 6.0 and albumin 2.6 g/dL.  The rest of the hepatic function tests are within normal limits.   Assessment & Plan:   Principal Problem:   Sepsis secondary to UTI [facility]) Active Problems:   Essential tremor   Hyponatremia   Essential hypertension   Anxiety   History of gout   GERD (gastroesophageal reflux disease)   Peripheral neuropathy   Anemia   Atrial fibrillation with RVR (HCC)   Hypomagnesemia   Sepsis secondary to UTI ([place]) -Continue  gentle IV hydration -Continue cefepime for now.   Follow-up urine culture and sensitivity. Follow-up blood culture and sensitivity.  Active Problems:   Atrial fibrillation with RVR ([place]) This was reportedly new finding. CHA?DS?-VASc Score of at least 5  (Age, female, HTN, aortic atherosclerosis on CT). Rate improved after hydration. BP is soft. So no BB or CCB for now. We will monitor electrolytes and replace as needed. Apixaban per pharmacy due to severe heparin allergy.    Hypomagnesemia/hypokalemia Replacing. Follow and replace as needed.    Essential tremor Continue primidone.    Hyponatremia - NS infusion. Hold hydrochlorothiazide. Follow-up sodium level.    Essential hypertension Hold benazepril and HCTZ. Continue carvedilol 6.25 mg p.o. twice daily. Monitor BP, HR, renal function, electrolytes.    Anxiety Continue alprazolam twice daily as needed.    History of gout Continue allopurinol 100 mg p.o. twice daily.    GERD (gastroesophageal reflux disease) Continue PPI.    Peripheral neuropathy Continue gabapentin.    Anemia Check anemia panel. Monitor H&H.   DVT prophylaxis: SCDs. Code Status: Full code. Family Communication: No family at bedside.  Will attempt to call son. Disposition Plan: Admit for IV antibiotic therapy for 2 to 3 days. Consults called:None  Procedures: None Antimicrobials:  Anti-infectives (From admission, onward)   Start     Dose/Rate Route Frequency Ordered Stop   12/10/18 1000  ceFEPIme (MAXIPIME) 2 g in sodium chloride 0.9 % 100 mL IVPB     2 g 200 mL/hr over 30  Minutes Intravenous Every 12 hours 12/09/18 2218     12/09/18 2145  ceFEPIme (MAXIPIME) 2 g in sodium chloride 0.9 % 100 mL IVPB     2 g 200 mL/hr over 30 Minutes Intravenous  Once 12/09/18 2133 12/09/18 2244   12/09/18 2100  cefTRIAXone (ROCEPHIN) 1 g in sodium chloride 0.9 % 100 mL IVPB  Status:  Discontinued     1 g 200 mL/hr over 30 Minutes  Intravenous  Once 12/09/18 2051 12/09/18 2149        Subjective: Mental status improved but she is still a little confused.  Urine and blood cultures pending at this time.  Objective: Vitals:   12/10/18 0000 12/10/18 0030 12/10/18 0123 12/10/18 0545  BP: (!) 93/53 114/65 125/67 (!) 147/70  Pulse: 84 82 88 99  Resp: 12 13 16 16   Temp:   97.8 F (36.6 C) 99.6 F (37.6 C)  TempSrc:   Oral Oral  SpO2: 97% 97% 96% 100%  Weight:      Height:        Intake/Output Summary (Last 24 hours) at 12/10/2018 0855 Last data filed at 12/10/2018 0500 Gross per 24 hour  Intake 2300.29 ml  Output 175 ml  Net 2125.29 ml   Filed Weights   12/09/18 1855 12/09/18 1909  Weight: 77.1 kg 79.4 kg    Examination:  General exam: Appears calm and comfortable  Respiratory system: Decreased breath sound lower lobes, clear to auscultation anteriorly Cardiovascular system: Irregularly irregular, no murmur Gastrointestinal system: Abdomen is nondistended, soft and nontender. Normal bowel sounds heard. Central nervous system: Awake and oriented, debility with generalized weakness Extremities: No edema. Skin: No rashes, lesions or ulcers Psychiatry: Mood & affect appropriate.     Data Reviewed: I have personally reviewed following labs and imaging studies  CBC: Recent Labs  Lab 12/09/18 1937  WBC 13.4*  NEUTROABS 10.6*  HGB 9.6*  HCT 29.7*  MCV 95.8  PLT 0000000   Basic Metabolic Panel: Recent Labs  Lab 12/09/18 1937 12/09/18 2310  NA 129*  --   K 3.5  --   CL 93*  --   CO2 24  --   GLUCOSE 104*  --   BUN 22  --   CREATININE 1.13*  --   CALCIUM 8.2*  --   MG  --  1.3*  PHOS  --  3.3   GFR: Estimated Creatinine Clearance: 40 mL/min (A) (by C-G formula based on SCr of 1.13 mg/dL (H)). Liver Function Tests: Recent Labs  Lab 12/09/18 1937  AST 26  ALT 30  ALKPHOS 103  BILITOT 0.3  PROT 6.0*  ALBUMIN 2.6*   No results for input(s): LIPASE, AMYLASE in the last 168 hours.  No results for input(s): AMMONIA in the last 168 hours. Coagulation Profile: Recent Labs  Lab 12/09/18 1937  INR 1.1   Cardiac Enzymes: No results for input(s): CKTOTAL, CKMB, CKMBINDEX, TROPONINI in the last 168 hours. BNP (last 3 results) No results for input(s): PROBNP in the last 8760 hours. HbA1C: No results for input(s): HGBA1C in the last 72 hours. CBG: Recent Labs  Lab 12/09/18 1904  GLUCAP 89   Lipid Profile: No results for input(s): CHOL, HDL, LDLCALC, TRIG, CHOLHDL, LDLDIRECT in the last 72 hours. Thyroid Function Tests: No results for input(s): TSH, T4TOTAL, FREET4, T3FREE, THYROIDAB in the last 72 hours. Anemia Panel: No results for input(s): VITAMINB12, FOLATE, FERRITIN, TIBC, IRON, RETICCTPCT in the last 72 hours. Sepsis Labs: Recent Labs  Lab  12/09/18 1937 12/09/18 2200  LATICACIDVEN 1.2 0.8    Recent Results (from the past 240 hour(s))  Blood Culture (routine x 2)     Status: None (Preliminary result)   Collection Time: 12/09/18  7:37 PM   Specimen: BLOOD  Result Value Ref Range Status   Specimen Description   Final    BLOOD LEFT ANTECUBITAL Performed at [facility] [address] [place], [place] [address]    Special Requests   Final    BOTTLES DRAWN AEROBIC AND ANAEROBIC Blood Culture adequate volume Performed at [facility] [address], [place], [place] [address]    Culture   Final    NO GROWTH < 12 HOURS Performed at [facility] [address], [place], [place] [address]    Report Status PENDING  Incomplete  SARS CORONAVIRUS 2 (TAT 6-24 HRS) Nasopharyngeal Nasopharyngeal Swab     Status: None   Collection Time: 12/09/18  7:37 PM   Specimen: Nasopharyngeal Swab  Result Value Ref Range Status   SARS Coronavirus 2 NEGATIVE NEGATIVE Final    Comment: (NOTE) SARS-CoV-2 target nucleic acids are NOT DETECTED. The SARS-CoV-2 RNA is generally detectable in upper and lower respiratory  specimens during the acute phase of infection. Negative results do not preclude SARS-CoV-2 infection, do not rule out co-infections with other pathogens, and should not be used as the sole basis for treatment or other patient management decisions. Negative results must be combined with clinical observations, patient history, and epidemiological information. The expected result is Negative. Fact Sheet for Patients: SugarRoll.be Fact Sheet for Healthcare Providers: https://www.woods-mathews.com/ This test is not yet approved or cleared by the [place] FDA and  has been authorized for detection and/or diagnosis of SARS-CoV-2 by FDA under an Emergency Use Authorization (EUA). This EUA will remain  in effect (meaning this test can be used) for the duration of the COVID-19 declaration under Section 56 4(b)(1) of the Act, 21 U.S.C. section 360bbb-3(b)(1), unless the authorization is terminated or revoked sooner. Performed at [facility], [place] [address] [place], [place] [address]          Radiology Studies: Dg Chest Port 1 View  Result Date: 12/09/2018 CLINICAL DATA:  Fever. Weakness and confusion. EXAM: PORTABLE CHEST 1 VIEW COMPARISON:  Radiograph 10/28/2018, CT 08/12/2015 FINDINGS: Upper normal heart size. Unchanged mediastinal contours. There is a retrocardiac hiatal hernia. Linear scarring at the left lung base. Pulmonary vasculature is normal. No consolidation, pleural effusion, or pneumothorax. No acute osseous abnormalities are seen. IMPRESSION: 1. No acute abnormality. 2. Retrocardiac hiatal hernia with linear scarring at the left lung base. Electronically Signed   By: Keith Rake M.D.   On: 12/09/2018 19:45        Scheduled Meds: . allopurinol  100 mg Oral BID  . apixaban  5 mg Oral BID  . carvedilol  6.25 mg Oral BID WC  . cholecalciferol  2,000 Units Oral Daily  . gabapentin  300 mg Oral BID  . Muscle Rub    Topical Daily  . nystatin-triamcinolone  1 application Topical BID  . pantoprazole  40 mg Oral Daily  . predniSONE  40 mg Oral QAC breakfast  . primidone  150 mg Oral Daily   And  . primidone  100 mg Oral QHS   Continuous Infusions: . ceFEPime (MAXIPIME) IV       LOS: 1 day    Yaakov Guthrie, MD [facility] Pager on [place]  If 7PM-7AM, please contact night-coverage [email or web address] Password [email or web address] 12/10/2018, 8:55 AM

## 2018-12-10 NOTE — Progress Notes (Signed)
Patient received to room 1512 from ED at [facility]. Patient arrived alert and oriented x4. She is able to participate in admission history and assessment. Denied pain, shortness of breath, and/or cough. Skin assessment completed and verified with charge nurse, Raquel Sarna. Tele applied and verified with charge nurse, Raquel Sarna. Safety measures to include bed alarm. initiated and call light placed within patient's reach. NT assisting with VS's and purewick placement.

## 2018-12-10 NOTE — TOC Initial Note (Signed)
Transition of Care [facility]) - Initial/Assessment Note    Patient Details  Name: Caitlin Chavez MRN: [id number] Date of Birth: 1934/11/21  Transition of Care [facility]) CM/SW Contact:    Trish Mage, LCSW Phone Number: 12/10/2018, 1:24 PM  Clinical Narrative:   Ms Gallick was seen due to high risk of readmission.  Had just recently returned home with son and daughter in law from [facility] SNF for rehab after she was discharged from here in October.   She was able to give me only limited information-is unsure why she is here, but was able to tell me she has a walker but does not generally use it, same with BSC, and she does use her shower bench.  At this point, she has not been evaluated by PT, so do not yet have recommendation for level of care following d/c.  TOC will continue to follow during the course of hospitalization.              Expected Discharge Plan: (SNF v [facility]) Barriers to Discharge: Continued Medical Work up   Patient Goals and CMS Choice Patient states their goals for this hospitalization and ongoing recovery are:: "Could you please call my son?  I think he knows better than I do." CMS Medicare.gov Compare Post Acute Care list provided to:: Patient Choice offered to / list presented to : Patient, Adult Children  Expected Discharge Plan and Services Expected Discharge Plan: (SNF v [facility])   Discharge Planning Services: CM Consult   Living arrangements for the past 2 months: Single Family Home                                      Prior Living Arrangements/Services Living arrangements for the past 2 months: Single Family Home Lives with:: Adult Children Patient language and need for interpreter reviewed:: Yes Do you feel safe going back to the place where you live?: Yes      Need for Family Participation in Patient Care: Yes (Comment) Care giver support system in place?: Yes (comment) Current home services: DME Criminal Activity/Legal Involvement Pertinent to  Current Situation/Hospitalization: No - Comment as needed  Activities of Daily Living Home Assistive Devices/Equipment: None(per pt) ADL Screening (condition at time of admission) Patient's cognitive ability adequate to safely complete daily activities?: Yes Is the patient deaf or have difficulty hearing?: No Does the patient have difficulty seeing, even when wearing glasses/contacts?: No Does the patient have difficulty concentrating, remembering, or making decisions?: No Patient able to express need for assistance with ADLs?: Yes Does the patient have difficulty dressing or bathing?: No Independently performs ADLs?: No Communication: Independent Dressing (OT): Appropriate for developmental age Grooming: Appropriate for developmental age Feeding: Appropriate for developmental age Bathing: Appropriate for developmental age 84: Appropriate for developmental age In/Out Bed: Appropriate for developmental age 84 in Home: Appropriate for developmental age Does the patient have difficulty walking or climbing stairs?: No Weakness of Legs: Both Weakness of Arms/Hands: Both  Permission Sought/Granted Permission sought to share information with : Family Supports Permission granted to share information with : Yes, Verbal Permission Granted        Permission granted to share info w Relationship: son, daughter in law     Emotional Assessment Appearance:: Appears stated age Attitude/Demeanor/Rapport: Engaged Affect (typically observed): Blunt Orientation: : Oriented to Self, Oriented to Place Alcohol / Substance Use: Not Applicable Psych Involvement: No (comment)  Admission  diagnosis:  Hyponatremia [E87.1] Acute pyelonephritis [N10] Patient Active Problem List   Diagnosis Date Noted  . Hypomagnesemia 12/10/2018  . Sepsis secondary to UTI ([place]) 12/09/2018  . Atrial fibrillation with RVR ([place]) 12/09/2018  . GERD (gastroesophageal reflux disease)   . Peripheral neuropathy    . Anemia   . History of gout 11/01/2018  . FUO (fever of unknown origin) 10/31/2018  . Acute bilateral ankle pain 10/31/2018  . Failure to thrive in adult 10/29/2018  . Ankle fracture, right 10/29/2018  . Falls 10/28/2018  . Hiatal hernia 10/28/2018  . Hyponatremia 10/28/2018  . Generalized weakness 10/28/2018  . Essential hypertension 10/28/2018  . Hypoalbuminemia 10/28/2018  . Anxiety 10/28/2018  . Essential tremor 07/09/2017   PCP:  Sueanne Margarita, DO Pharmacy:   [facility] [place], [place] - [address] [place] [address] Phone: [phone] Fax: [phone]     Social Determinants of Health (SDOH) Interventions    Readmission Risk Interventions No flowsheet data found.

## 2018-12-11 LAB — CBC
HCT: 24.2 % — ABNORMAL LOW (ref 36.0–46.0)
Hemoglobin: 7.7 g/dL — ABNORMAL LOW (ref 12.0–15.0)
MCH: 30.9 pg (ref 26.0–34.0)
MCHC: 31.8 g/dL (ref 30.0–36.0)
MCV: 97.2 fL (ref 80.0–100.0)
Platelets: 213 K/uL (ref 150–400)
RBC: 2.49 MIL/uL — ABNORMAL LOW (ref 3.87–5.11)
RDW: 14.7 % (ref 11.5–15.5)
WBC: 9 K/uL (ref 4.0–10.5)
nRBC: 0 % (ref 0.0–0.2)

## 2018-12-11 LAB — HEMOGLOBIN AND HEMATOCRIT, BLOOD
HCT: 33.9 % — ABNORMAL LOW (ref 36.0–46.0)
Hemoglobin: 10.2 g/dL — ABNORMAL LOW (ref 12.0–15.0)

## 2018-12-11 LAB — BASIC METABOLIC PANEL WITH GFR
Anion gap: 9 (ref 5–15)
BUN: 12 mg/dL (ref 8–23)
CO2: 20 mmol/L — ABNORMAL LOW (ref 22–32)
Calcium: 8 mg/dL — ABNORMAL LOW (ref 8.9–10.3)
Chloride: 101 mmol/L (ref 98–111)
Creatinine, Ser: 0.59 mg/dL (ref 0.44–1.00)
GFR calc Af Amer: >60 mL/min
GFR calc non Af Amer: >60 mL/min
Glucose, Bld: 87 mg/dL (ref 70–99)
Potassium: 4 mmol/L (ref 3.5–5.1)
Sodium: 130 mmol/L — ABNORMAL LOW (ref 135–145)

## 2018-12-11 LAB — MAGNESIUM: Magnesium: 1.9 mg/dL (ref 1.7–2.4)

## 2018-12-11 MED ORDER — TAMSULOSIN HCL 0.4 MG PO CAPS
0.4000 mg | ORAL_CAPSULE | Freq: Every day | ORAL | Status: DC
  Administered 2018-12-11 – 2018-12-15 (×5): 0.4 mg via ORAL
  Filled 2018-12-11 (×5): qty 1

## 2018-12-11 MED ORDER — METRONIDAZOLE 500 MG PO TABS
500.0000 mg | ORAL_TABLET | Freq: Two times a day (BID) | ORAL | Status: AC
  Administered 2018-12-11 – 2018-12-13 (×5): 500 mg via ORAL
  Filled 2018-12-11 (×6): qty 1

## 2018-12-11 NOTE — Progress Notes (Signed)
BP= 145/73. Patient has history of HTN. Medication was given as MD ordered. Will check BP later. Patient is alert and oriented x 3

## 2018-12-11 NOTE — Plan of Care (Signed)
Pt. Has b ue tremors that limit function. Pt. Son was in the room and stated that she needs assist with all adls secondary to tremors and weakness. They do not have weighted utensils and son states he does not think that will work. Pt. To be seen for skilled ot acute care to increase I and safety with ADLs and mobility. Pt. Son does not want SNF and can provide 24 hour care at home.

## 2018-12-11 NOTE — Evaluation (Signed)
Physical Therapy Evaluation Patient Details Name: Caitlin Chavez MRN: [id number] DOB: [birth year] Today's Date: 12/11/2018   History of Present Illness  Caitlin Chavez is a 84 y.o. female with medical history significant of anxiety, osteoarthritis of the hands, carpal tunnel syndrome, colon polyps, diverticulosis, GERD, hiatal hernia, hypertension, macular degeneration, peripheral neuropathy who was at rehab for recent hospitalization and then returned home on 12/06/2018. She came to the ED due to fever, weakness, confusion and diarrhea.  Clinical Impression  Pt admitted with above diagnosis.  Pt currently with functional limitations due to the deficits listed below (see PT Problem List). Pt will benefit from skilled PT to increase their independence and safety with mobility to allow discharge to the venue listed below.  Pt reluctantly stood with PT and marched in place, but refused gait with numerous attempts.  Pt apologized for being "irritable", but "didn't feel like doing this." When discussed d/c planning with pt, she was quite adamant that she would be going home with family A and does not want SNF.  She states she will have 24/7 A between her son and grand-daughter.  Will need to confirm, but if she does then recommend home with HHPT. If she doesn't would need to consider SNF, unless she can progress.       Follow Up Recommendations Home health PT;Supervision/Assistance - 24 hour    Equipment Recommendations  3in1 (PT)    Recommendations for Other Services       Precautions / Restrictions Precautions Precautions: Fall Restrictions Weight Bearing Restrictions: No      Mobility  Bed Mobility               General bed mobility comments: Pt up in recliner upon arrival.  Transfers Overall transfer level: Needs assistance Equipment used: Rolling walker (2 wheeled) Transfers: Sit to/from Stand Sit to Stand: Min assist;Mod assist         General transfer comment: Pt  required MIN/MOD A from low recliner to stand.  Once standing, she was able to stand for 4 minutes with marching in place 15 seconds x 2 and upright posture.  Ambulation/Gait             General Gait Details: Pt declined ambulation despite numerous attempts. March in place at [facility], feel pt could have ambulated in room distances with RW and MIN A.  Stairs            Wheelchair Mobility    Modified Rankin (Stroke Patients Only)       Balance Overall balance assessment: Needs assistance           Standing balance-Leahy Scale: Poor Standing balance comment: requires UE support                             Pertinent Vitals/Pain Pain Assessment: No/denies pain    Home Living Family/patient expects to be discharged to:: Private residence Living Arrangements: Children Available Help at Discharge: Family;Available 24 hours/day Type of Home: House Home Access: Stairs to enter Entrance Stairs-Rails: Can reach both Entrance Stairs-Number of Steps: 2 Home Layout: Multi-level;Able to live on main level with bedroom/bathroom Home Equipment: Gilford Rile - 2 wheels;Cane - single point;Shower seat      Prior Function Level of Independence: Independent         Comments: She reports she was walking I'ly after rehab stay.     Hand Dominance   Dominant Hand: Right    Extremity/Trunk  Assessment   Upper Extremity Assessment Upper Extremity Assessment: Defer to OT evaluation    Lower Extremity Assessment Lower Extremity Assessment: Generalized weakness       Communication   Communication: No difficulties  Cognition Arousal/Alertness: Awake/alert Behavior During Therapy: Flat affect;Agitated Overall Cognitive Status: Within Functional Limits for tasks assessed                                 General Comments: Pt alert and oriented.  Her answers to her home environment match what was in the chart.  She did not know why she was in the hospital  and had a flat affect at times, but also agitated.  She apologized saying, "I'm sorry I'm being irritable, I just don't feel like doing this."      General Comments      Exercises     Assessment/Plan    PT Assessment Patient needs continued PT services  PT Problem List Decreased strength;Decreased activity tolerance;Decreased balance;Decreased mobility;Decreased knowledge of use of DME       PT Treatment Interventions DME instruction;Gait training;Functional mobility training;Stair training;Therapeutic activities;Therapeutic exercise;Balance training;Neuromuscular re-education    PT Goals (Current goals can be found in the Care Plan section)  Acute Rehab PT Goals Patient Stated Goal: Go home today! PT Goal Formulation: With patient Time For Goal Achievement: 12/25/18 Potential to Achieve Goals: Good    Frequency Min 3X/week   Barriers to discharge Decreased caregiver support Need to confirm that she will have 24/7 A.    Co-evaluation               AM-PAC PT "6 Clicks" Mobility  Outcome Measure Help needed turning from your back to your side while in a flat bed without using bedrails?: A Little Help needed moving from lying on your back to sitting on the side of a flat bed without using bedrails?: A Lot Help needed moving to and from a bed to a chair (including a wheelchair)?: A Little Help needed standing up from a chair using your arms (e.g., wheelchair or bedside chair)?: A Little Help needed to walk in hospital room?: A Little Help needed climbing 3-5 steps with a railing? : A Lot 6 Click Score: 16    End of Session Equipment Utilized During Treatment: Gait belt Activity Tolerance: Patient limited by fatigue Patient left: in chair;with call bell/phone within reach;with chair alarm set Nurse Communication: Mobility status PT Visit Diagnosis: Difficulty in walking, not elsewhere classified (R26.2);Muscle weakness (generalized) (M62.81)    Time: [id number] PT  Time Calculation (min) (ACUTE ONLY): 20 min   Charges:   PT Evaluation $PT Eval Low Complexity: 1 Low          Caitlin Chavez, [place] Pager [phone] 12/11/2018   Galen Chavez 12/11/2018, 10:24 AM

## 2018-12-11 NOTE — TOC Progression Note (Signed)
Transition of Care [facility]) - Progression Note    Patient Details  Name: Caitlin Chavez MRN: [id number] Date of Birth: Feb 20, 1934  Transition of Care [facility]) CM/SW [place], [place] Phone Number: 12/11/2018, 11:41 AM  Clinical Narrative:   Met with son of patient Raelynne Ludwick to discuss plan going forward given PT recommendation of 24 hour supervision.  Mr Bonus states family has a plan in place for round the clock supervision, and they will continue with [facility] PT, OT services. The patient will require a resume HH order at d/c. TOC will continue to follow during the course of hospitalization.      Expected Discharge Plan: [place] Barriers to Discharge: No Barriers Identified  Expected Discharge Plan and Services Expected Discharge Plan: [place]   Discharge Planning Services: CM Consult   Living arrangements for the past 2 months: Single Family Home                                       Social Determinants of Health (SDOH) Interventions    Readmission Risk Interventions No flowsheet data found.

## 2018-12-11 NOTE — Progress Notes (Signed)
PROGRESS NOTE    Caitlin Chavez  [id number] DOB: 08/07/1934 DOA: 12/09/2018 PCP: Sueanne Margarita, DO   Brief Narrative:  Caitlin Chavez a 84 y.o.femalewith medical history significant ofanxiety, osteoarthritis of the hands, carpal tunnel syndrome, colon polyps, diverticulosis, GERD, hiatal hernia, hypertension, macular degeneration, peripheral neuropathy who was recently admitted for FUO on 10/29/2018, discharged to a rehab facility, returning home on on 12/06/2018 now presented to the ED due to fever, weakness, confusion and diarrhea.Per son she was well for the first day and a half, then started having significant urinary urgency. She had some loose stool episodes.   ED Course:Her initial vital signs temperature 101.6 F, pulse 105, respirations 16, blood pressure 134/66 mmHg and O2 sat 97% on room air. The patient received a 1000 mL NS bolus.  UA showed evidence of urinary tract infection.  Urine cultures preliminary result showing greater than 100,000 colonies per mL of gram-negative rods.  Final culture still pending at this time.  Assessment & Plan:   Principal Problem:   Sepsis secondary to UTI [facility]) Active Problems:   Essential tremor   Hyponatremia   Essential hypertension   Anxiety   History of gout   GERD (gastroesophageal reflux disease)   Peripheral neuropathy   Anemia   Atrial fibrillation with RVR (HCC)   Hypomagnesemia  Sepsis secondary to UTI ([place]) -Continue gentle IV hydration -Continue cefepime for now.   12/11/2018: Preliminary urine culture showing greater than 100,000 colonies per mL of gram-negative rods. Follow-up urine culture and sensitivity.  Blood cultures do not show any growth.  Active Problems: Atrial fibrillation with RVR ([place]) This was reportedly new finding. CHA?DS?-VASc Scoreof at least 5  (Age, female, HTN, aortic atherosclerosis on CT). Rate improved after hydration. 12/11/2018: BP and heart rate is fluctuating.  On  carvedilol. Continue to monitor blood pressure closely. Monitor electrolytes and replace as needed. Apixaban per pharmacy due to severe heparin allergy.  Hypomagnesemia/hypokalemia Replacing. Follow and replace as needed.  Essential tremor Continue primidone.  Hyponatremia - NS infusion. Hold hydrochlorothiazide. Follow-up sodium level.  Essential hypertension Hold benazepril and HCTZ. Continue carvedilol 6.25 mg p.o. twice daily. Monitor BP, HR, renal function, electrolytes.  Anxiety Continue alprazolam twice daily as needed.  History of gout Continue allopurinol 100 mg p.o. twice daily.  GERD (gastroesophageal reflux disease) Continue PPI.  Peripheral neuropathy Continue gabapentin.  Anemia Hemoglobin low this morning.  Denies any GI bleed. Iron level low. Results for CHYNA, HOEG (MRN [id number]) as of 12/11/2018 12:16  Ref. Range 12/10/2018 09:42  Iron Latest Ref Range: 28 - 170 ug/dL 11 (L)  UIBC Latest Units: ug/dL 147  TIBC Latest Ref Range: 250 - 450 ug/dL 158 (L)  Saturation Ratios Latest Ref Range: 10.4 - 31.8 % 7 (L)  Ferritin Latest Ref Range: 11 - 307 ng/mL 92  Folate Latest Ref Range: >5.9 ng/mL 19.7   -Will check stool for occult blood.  Continue to monitor hemoglobin.  DVT prophylaxis:SCDs. Code Status:Full code. Family Communication: No family at bedside.  Disposition Plan:Admit for IV antibiotic therapy for 2 to 3 days. Consults called:None  Procedures: None Antimicrobials:  Anti-infectives (From admission, onward)   Start     Dose/Rate Route Frequency Ordered Stop   12/10/18 1000  ceFEPIme (MAXIPIME) 2 g in sodium chloride 0.9 % 100 mL IVPB     2 g 200 mL/hr over 30 Minutes Intravenous Every 12 hours 12/09/18 2218     12/09/18 2145  ceFEPIme (MAXIPIME) 2 g in sodium  chloride 0.9 % 100 mL IVPB     2 g 200 mL/hr over 30 Minutes Intravenous  Once 12/09/18 2133 12/09/18 2244   12/09/18 2100  cefTRIAXone  (ROCEPHIN) 1 g in sodium chloride 0.9 % 100 mL IVPB  Status:  Discontinued     1 g 200 mL/hr over 30 Minutes Intravenous  Once 12/09/18 2051 12/09/18 2149        Subjective: No acute events overnight.  Hemoglobin this morning a little low.  She denies having any major complaints at this time.  Objective: Vitals:   12/10/18 0545 12/10/18 1405 12/10/18 1921 12/11/18 0437  BP: (!) 147/70 119/69 116/72 135/72  Pulse: 99 98 77 81  Resp: 16 16 16 14   Temp: 99.6 F (37.6 C) 98.6 F (37 C) 98.4 F (36.9 C) 98.5 F (36.9 C)  TempSrc: Oral Oral Oral Oral  SpO2: 100% 98% 100% 98%  Weight:      Height:        Intake/Output Summary (Last 24 hours) at 12/11/2018 1208 Last data filed at 12/11/2018 [id number] Gross per 24 hour  Intake 1112.79 ml  Output 1250 ml  Net -137.21 ml   Filed Weights   12/09/18 1855 12/09/18 1909  Weight: 77.1 kg 79.4 kg    Examination:  General exam: Appears calm and comfortable  Respiratory system: Decreased breath sounds lower lobes, otherwise clear to auscultation. Cardiovascular system: Irregularly irregular, no murmur  Gastrointestinal system: Abdomen is nondistended, soft and nontender. Normal bowel sounds heard. Central nervous system: Alert and oriented. No focal neurological deficits. Extremities: No edema Skin: No rashes Psychiatry: Mood & affect appropriate.     Data Reviewed: I have personally reviewed following labs and imaging studies  CBC: Recent Labs  Lab 12/09/18 1937 12/10/18 0942 12/11/18 0528  WBC 13.4* 11.8* 9.0  NEUTROABS 10.6* 10.0*  --   HGB 9.6* 9.0* 7.7*  HCT 29.7* 28.3* 24.2*  MCV 95.8 98.3 97.2  PLT 258 226 [id number]   Basic Metabolic Panel: Recent Labs  Lab 12/09/18 1937 12/09/18 2310 12/10/18 0942 12/11/18 0528  NA 129*  --  130* 130*  K 3.5  --  3.3* 4.0  CL 93*  --  101 101  CO2 24  --  18* 20*  GLUCOSE 104*  --  163* 87  BUN 22  --  14 12  CREATININE 1.13*  --  0.76 0.59  CALCIUM 8.2*  --  7.8* 8.0*  MG   --  1.3*  --  1.9  PHOS  --  3.3  --   --    GFR: Estimated Creatinine Clearance: 56.5 mL/min (by C-G formula based on SCr of 0.59 mg/dL). Liver Function Tests: Recent Labs  Lab 12/09/18 1937 12/10/18 0942  AST 26 16  ALT 30 21  ALKPHOS 103 81  BILITOT 0.3 1.0  PROT 6.0* 5.3*  ALBUMIN 2.6* 2.3*   No results for input(s): LIPASE, AMYLASE in the last 168 hours. No results for input(s): AMMONIA in the last 168 hours. Coagulation Profile: Recent Labs  Lab 12/09/18 1937  INR 1.1   Cardiac Enzymes: No results for input(s): CKTOTAL, CKMB, CKMBINDEX, TROPONINI in the last 168 hours. BNP (last 3 results) No results for input(s): PROBNP in the last 8760 hours. HbA1C: No results for input(s): HGBA1C in the last 72 hours. CBG: Recent Labs  Lab 12/09/18 1904  GLUCAP 89   Lipid Profile: No results for input(s): CHOL, HDL, LDLCALC, TRIG, CHOLHDL, LDLDIRECT in the last 72 hours. Thyroid  Function Tests: No results for input(s): TSH, T4TOTAL, FREET4, T3FREE, THYROIDAB in the last 72 hours. Anemia Panel: Recent Labs    12/10/18 0942  VITAMINB12 789  FOLATE 19.7  FERRITIN 92  TIBC 158*  IRON 11*  RETICCTPCT 1.6   Sepsis Labs: Recent Labs  Lab 12/09/18 1937 12/09/18 2200  LATICACIDVEN 1.2 0.8    Recent Results (from the past 240 hour(s))  Blood Culture (routine x 2)     Status: None (Preliminary result)   Collection Time: 12/09/18  7:37 PM   Specimen: BLOOD  Result Value Ref Range Status   Specimen Description   Final    BLOOD LEFT ANTECUBITAL Performed at [facility], [place] [address] [place], [place] [address]    Special Requests   Final    BOTTLES DRAWN AEROBIC AND ANAEROBIC Blood Culture adequate volume Performed at [facility] [address] [place], [place] [address]    Culture   Final    NO GROWTH 2 DAYS Performed at [facility] [address], [place], [place] [address]    Report Status PENDING   Incomplete  Urine culture     Status: Abnormal (Preliminary result)   Collection Time: 12/09/18  7:37 PM   Specimen: In/Out Cath Urine  Result Value Ref Range Status   Specimen Description   Final    IN/OUT CATH URINE Performed at [facility] [address], [place], [place] [address]    Special Requests   Final    NONE Performed at [facility], [place] [address], [place], [place] [address]    Culture >=100,000 COLONIES/mL GRAM NEGATIVE RODS (A)  Final   Report Status PENDING  Incomplete  SARS CORONAVIRUS 2 (TAT 6-24 HRS) Nasopharyngeal Nasopharyngeal Swab     Status: None   Collection Time: 12/09/18  7:37 PM   Specimen: Nasopharyngeal Swab  Result Value Ref Range Status   SARS Coronavirus 2 NEGATIVE NEGATIVE Final    Comment: (NOTE) SARS-CoV-2 target nucleic acids are NOT DETECTED. The SARS-CoV-2 RNA is generally detectable in upper and lower respiratory specimens during the acute phase of infection. Negative results do not preclude SARS-CoV-2 infection, do not rule out co-infections with other pathogens, and should not be used as the sole basis for treatment or other patient management decisions. Negative results must be combined with clinical observations, patient history, and epidemiological information. The expected result is Negative. Fact Sheet for Patients: SugarRoll.be Fact Sheet for Healthcare Providers: https://www.woods-mathews.com/ This test is not yet approved or cleared by the [place] FDA and  has been authorized for detection and/or diagnosis of SARS-CoV-2 by FDA under an Emergency Use Authorization (EUA). This EUA will remain  in effect (meaning this test can be used) for the duration of the COVID-19 declaration under Section 56 4(b)(1) of the Act, 21 U.S.C. section 360bbb-3(b)(1), unless the authorization is terminated or revoked sooner. Performed at [facility],  [place] [address], [place], [place] [address]   C difficile quick scan w PCR reflex     Status: None   Collection Time: 12/10/18 11:08 AM   Specimen: STOOL  Result Value Ref Range Status   C Diff antigen NEGATIVE NEGATIVE Final   C Diff toxin NEGATIVE NEGATIVE Final   C Diff interpretation No C. difficile detected.  Final    Comment: Performed at [facility], [place] [address], [place], [place] [address]         Radiology  Studies: Dg Chest Port 1 View  Result Date: 12/09/2018 CLINICAL DATA:  Fever. Weakness and confusion. EXAM: PORTABLE CHEST 1 VIEW COMPARISON:  Radiograph 10/28/2018, CT 08/12/2015 FINDINGS: Upper normal heart size. Unchanged mediastinal contours. There is a retrocardiac hiatal hernia. Linear scarring at the left lung base. Pulmonary vasculature is normal. No consolidation, pleural effusion, or pneumothorax. No acute osseous abnormalities are seen. IMPRESSION: 1. No acute abnormality. 2. Retrocardiac hiatal hernia with linear scarring at the left lung base. Electronically Signed   By: Keith Rake M.D.   On: 12/09/2018 19:45    Scheduled Meds:  allopurinol  100 mg Oral BID   apixaban  5 mg Oral BID   carvedilol  6.25 mg Oral BID WC   cholecalciferol  2,000 Units Oral Daily   gabapentin  300 mg Oral BID   Muscle Rub   Topical Daily   nystatin-triamcinolone  1 application Topical BID   pantoprazole  40 mg Oral Daily   predniSONE  40 mg Oral QAC breakfast   primidone  150 mg Oral Daily   And   primidone  100 mg Oral QHS   Continuous Infusions:  sodium chloride 250 mL (12/10/18 2039)   ceFEPime (MAXIPIME) IV 2 g (12/11/18 1003)     LOS: 2 days    Yaakov Guthrie, MD [facility] Pager on [place]  If 7PM-7AM, please contact night-coverage [email or web address] Password [facility] 12/11/2018, 12:08 PM

## 2018-12-11 NOTE — Evaluation (Signed)
Occupational Therapy Evaluation Patient Details Name: Caitlin Chavez MRN: [id number] DOB: 16-Nov-1934 Today's Date: 12/11/2018    History of Present Illness Caitlin Chavez is a 84 y.o. female with medical history significant of anxiety, osteoarthritis of the hands, carpal tunnel syndrome, colon polyps, diverticulosis, GERD, hiatal hernia, hypertension, macular degeneration, peripheral neuropathy who was at rehab for recent hospitalization and then returned home on 12/06/2018. She came to the ED due to fever, weakness, confusion and diarrhea.   Clinical Impression   Pt. Has b ue tremors that limit function. Pt. Son was in the room and stated that she needs assist with all adls secondary to tremors and weakness. They do not have weighted utensils and son states he does not think that will work. Pt. To be seen for skilled ot acute care to increase I and safety with ADLs and mobility. Pt. Son does not want SNF and can provide 24 hour care at home.     Follow Up Recommendations  Home health OT    Equipment Recommendations  None recommended by OT    Recommendations for Other Services       Precautions / Restrictions Precautions Precautions: Fall Restrictions Weight Bearing Restrictions: No      Mobility Bed Mobility               General bed mobility comments: Pt up in recliner upon arrival.  Transfers Overall transfer level: Needs assistance Equipment used: Rolling walker (2 wheeled) Transfers: Sit to/from Stand Sit to Stand: Mod assist         General transfer comment: mod a from sit to stand from chair.    Balance Overall balance assessment: Needs assistance           Standing balance-Leahy Scale: Poor Standing balance comment: requires UE support                           ADL either performed or assessed with clinical judgement   ADL Overall ADL's : Needs assistance/impaired Eating/Feeding: Minimal assistance;Moderate assistance(varies  depending on tremors)   Grooming: Wash/dry hands;Wash/dry face;Minimal assistance   Upper Body Bathing: Moderate assistance;Sitting   Lower Body Bathing: Moderate assistance;Sit to/from stand   Upper Body Dressing : Moderate assistance;Sitting   Lower Body Dressing: Moderate assistance;Maximal assistance;Sit to/from stand   Toilet Transfer: Moderate assistance;BSC   Toileting- Clothing Manipulation and Hygiene: Maximal assistance;Sit to/from stand       Functional mobility during ADLs: Moderate assistance;Rolling walker General ADL Comments: patient tremors increase level of assistance needed for adls     Vision Baseline Vision/History: Macular Degeneration Patient Visual Report: No change from baseline       Perception     Praxis      Pertinent Vitals/Pain Pain Assessment: No/denies pain     Hand Dominance Right   Extremity/Trunk Assessment Upper Extremity Assessment Upper Extremity Assessment: Generalized weakness   Lower Extremity Assessment Lower Extremity Assessment: Generalized weakness       Communication Communication Communication: No difficulties   Cognition Arousal/Alertness: Awake/alert Behavior During Therapy: Flat affect;Agitated Overall Cognitive Status: Within Functional Limits for tasks assessed                                 General Comments: Pt alert and oriented.  Her answers to her home environment match what was in the chart.  She did not know why she was in  the hospital and had a flat affect at times, but also agitated.  She apologized saying, "I'm sorry I'm being irritable, I just don't feel like doing this."   General Comments       Exercises     Shoulder Instructions      Home Living Family/patient expects to be discharged to:: Private residence Living Arrangements: Children Available Help at Discharge: Family;Available 24 hours/day Type of Home: House Home Access: Stairs to enter [employer] of  Steps: 2 Entrance Stairs-Rails: Can reach both Home Layout: Multi-level;Able to live on main level with bedroom/bathroom     Bathroom Shower/Tub: Teacher, early years/pre: Standard Bathroom Accessibility: Yes   Home Equipment: Walker - 2 wheels;Cane - single point;Shower seat          Prior Functioning/Environment Level of Independence: Needs assistance        Comments: She reports she was walking I'ly after rehab stay.        OT Problem List: Decreased strength;Decreased activity tolerance;Decreased knowledge of use of DME or AE      OT Treatment/Interventions: Self-care/ADL training;DME and/or AE instruction;Therapeutic activities;Patient/family education    OT Goals(Current goals can be found in the care plan section) Acute Rehab OT Goals Patient Stated Goal: go home OT Goal Formulation: With patient Time For Goal Achievement: 12/25/18 Potential to Achieve Goals: Good  OT Frequency: Min 2X/week   Barriers to D/C:            Co-evaluation              AM-PAC OT "6 Clicks" Daily Activity     Outcome Measure Help from another person eating meals?: A Lot Help from another person taking care of personal grooming?: A Lot Help from another person toileting, which includes using toliet, bedpan, or urinal?: A Lot Help from another person bathing (including washing, rinsing, drying)?: A Lot Help from another person to put on and taking off regular upper body clothing?: A Lot Help from another person to put on and taking off regular lower body clothing?: A Lot 6 Click Score: 12   End of Session Equipment Utilized During Treatment: Gait belt;Rolling walker Nurse Communication: (ok therapy)  Activity Tolerance: Patient limited by fatigue Patient left: in chair;with call bell/phone within reach;with chair alarm set  OT Visit Diagnosis: Unsteadiness on feet (R26.81);Other abnormalities of gait and mobility (R26.89);Repeated falls (R29.6);History of  falling (Z91.81)                Time: [id number] OT Time Calculation (min): 35 min Charges:  OT General Charges $OT Visit: 1 Visit OT Evaluation $OT Eval Moderate Complexity: 1 Mod  6 clicks  Aileen Amore 12/11/2018, 11:59 AM

## 2018-12-12 LAB — BASIC METABOLIC PANEL
Anion gap: 9 (ref 5–15)
BUN: 12 mg/dL (ref 8–23)
CO2: 21 mmol/L — ABNORMAL LOW (ref 22–32)
Calcium: 8.4 mg/dL — ABNORMAL LOW (ref 8.9–10.3)
Chloride: 101 mmol/L (ref 98–111)
Creatinine, Ser: 0.55 mg/dL (ref 0.44–1.00)
GFR calc Af Amer: >60 mL/min (ref >60)
GFR calc non Af Amer: >60 mL/min (ref >60)
Glucose, Bld: 83 mg/dL (ref 70–99)
Potassium: 3.8 mmol/L (ref 3.5–5.1)
Sodium: 131 mmol/L — ABNORMAL LOW (ref 135–145)

## 2018-12-12 LAB — CBC
HCT: 26.4 % — ABNORMAL LOW (ref 36.0–46.0)
Hemoglobin: 8.4 g/dL — ABNORMAL LOW (ref 12.0–15.0)
MCH: 31.2 pg (ref 26.0–34.0)
MCHC: 31.8 g/dL (ref 30.0–36.0)
MCV: 98.1 fL (ref 80.0–100.0)
Platelets: 225 10*3/uL (ref 150–400)
RBC: 2.69 MIL/uL — ABNORMAL LOW (ref 3.87–5.11)
RDW: 14.8 % (ref 11.5–15.5)
WBC: 8.7 10*3/uL (ref 4.0–10.5)
nRBC: 0 % (ref 0.0–0.2)

## 2018-12-12 LAB — URINE CULTURE: Culture: >=100000 — AB

## 2018-12-12 LAB — OCCULT BLOOD X 1 CARD TO LAB, STOOL: Fecal Occult Bld: NEGATIVE

## 2018-12-12 MED ORDER — CEFAZOLIN SODIUM-DEXTROSE 1-4 GM/50ML-% IV SOLN
1.0000 g | Freq: Three times a day (TID) | INTRAVENOUS | Status: DC
  Administered 2018-12-12 – 2018-12-14 (×7): 1 g via INTRAVENOUS
  Filled 2018-12-12 (×9): qty 50

## 2018-12-12 NOTE — Progress Notes (Signed)
ANTICOAGULATION CONSULT NOTE - Initial Consult  Pharmacy Consult for Eliquis Indication: atrial fibrillation  Allergies  Allergen Reactions  . Alka-Seltzer [Aspirin Effervescent] Anaphylaxis and Hives  . Beef-Derived Products Anaphylaxis  . Nsaids Anaphylaxis and Hives  . Other Anaphylaxis and Rash  . Pork-Derived Products Anaphylaxis  . Codeine Hives and Nausea And Vomiting   Patient Measurements: Height: 5\' 5"  (165.1 cm) Weight: 175 lb (79.4 kg) IBW/kg (Calculated) : 57  Vital Signs: Temp: 97.6 F (36.4 C) (11/13 0528) Temp Source: Oral (11/13 0528) BP: 151/78 (11/13 0528) Pulse Rate: 85 (11/13 0528)  Labs: Recent Labs    12/09/18 1937 12/10/18 0942 12/11/18 0528 12/11/18 1356 12/12/18 0656  HGB 9.6* 9.0* 7.7* 10.2* 8.4*  HCT 29.7* 28.3* 24.2* 33.9* 26.4*  PLT 258 226 213  --  225  APTT 26  --   --   --   --   LABPROT 14.1  --   --   --   --   INR 1.1  --   --   --   --   CREATININE 1.13* 0.76 0.59  --  0.55    Estimated Creatinine Clearance: 56.5 mL/min (by C-G formula based on SCr of 0.55 mg/dL).   Medical History: Past Medical History:  Diagnosis Date  . Anxiety   . Arthritis    hands  . Carpal tunnel syndrome   . Colon polyps   . Diverticulosis   . FUO (fever of unknown origin) 10/31/2018  . GERD (gastroesophageal reflux disease)   . Hiatal hernia   . Hypertension   . Macular degeneration   . Peripheral neuropathy    Medications:  No anticoagulants PTA noted.  Assessment: 56 yoF with new onset AFib.   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  CBC: Hgb low but at patient's baseline, Pltc WNL.  No bleeding noted.  79.4kg, SCr wnl (0.55) Patient only meets one criteria for dose reduction (age).   Plan:  Eliquis 5mg  PO BID Monitor for s/sx of bleeding Patient education completed & can provide 30 day free card if plan to continue after discharge  Minda Ditto, PharmD 12/12/2018,10:47 AM

## 2018-12-12 NOTE — Progress Notes (Signed)
Pharmacy Antibiotic Note  Caitlin Chavez is a 84 y.o. female admitted on 12/09/2018 with UTI.  Pharmacy was consulted for Cefepime dosing.  Plan: De-escalate Cefepime to Ancef 1gm q8 today, anticipate po abx tomorrow Monitor renal function and cx data   Height: 5\' 5"  (165.1 cm) Weight: 175 lb (79.4 kg) IBW/kg (Calculated) : 57  Temp (24hrs), Avg:97.9 F (36.6 C), Min:97.6 F (36.4 C), Max:98.4 F (36.9 C)  Recent Labs  Lab 12/09/18 1937 12/09/18 2200 12/10/18 0942 12/11/18 0528 12/12/18 0656  WBC 13.4*  --  11.8* 9.0 8.7  CREATININE 1.13*  --  0.76 0.59 0.55  LATICACIDVEN 1.2 0.8  --   --   --     Estimated Creatinine Clearance: 56.5 mL/min (by C-G formula based on SCr of 0.55 mg/dL).    Allergies  Allergen Reactions  . Alka-Seltzer [Aspirin Effervescent] Anaphylaxis and Hives  . Beef-Derived Products Anaphylaxis  . Nsaids Anaphylaxis and Hives  . Other Anaphylaxis and Rash  . Pork-Derived Products Anaphylaxis  . Codeine Hives and Nausea And Vomiting   Antimicrobials this admission: 11/10 Rocephin x1 11/10 Cefepime >> 11/13 11/12 Flagyl po x 5 doses 11/13 Ancef >>  Dose adjustments this admission:  Microbiology results: 11/10 BCx x1: ngtd  11/10 UCx: >100K E coli, pan sens 11/10 COVID: neg 11/11 CDiff: neg/neg  Thank you for allowing pharmacy to be a part of this patient's care.  Minda Ditto, PharmD 12/12/2018 8:55 AM

## 2018-12-12 NOTE — Progress Notes (Signed)
PROGRESS NOTE    Caitlin Chavez  [id number] DOB: 1934-08-27 DOA: 12/09/2018 PCP: Sueanne Margarita, DO   Brief Narrative:  Caitlin Chavez a 84 y.o.femalewith medical history significant ofanxiety, osteoarthritis of the hands, carpal tunnel syndrome, colon polyps, diverticulosis, GERD, hiatal hernia, hypertension, macular degeneration, peripheral neuropathy who was recently admitted for FUO on 10/29/2018, discharged to a rehab facility, returning home on on 12/06/2018 nowpresentedto the ED due to fever, weakness, confusion and diarrhea.Person she was well for the first day and a half, then started having significant urinary urgency. She had some loose stool episodes. ED Course:Her initial vital signs temperature 101.6 F, pulse 105, respirations 16, blood pressure 134/66 mmHg and O2 sat 97% on room air. The patient received a 1000 mL NS bolus.  UA showed evidence of urinary tract infection.  Urine cultures preliminary result showing greater than 100,000 colonies per mL of gram-negative rods.  Final culture still pending at this time.   Assessment & Plan:   Principal Problem:   Sepsis secondary to UTI [facility]) Active Problems:   Essential tremor   Hyponatremia   Essential hypertension   Anxiety   History of gout   GERD (gastroesophageal reflux disease)   Peripheral neuropathy   Anemia   Atrial fibrillation with RVR (HCC)   Hypomagnesemia  # Sepsis secondary to UTI (HCC)-improving -Continue gentle IV hydration. -Continue cefepime for now. 12/11/2018: Preliminary urine culture showing greater than 100,000 colonies per mL of gram-negative rods. Follow-up urine culture and sensitivity.  Blood cultures do not show any growth.   # Atrial fibrillation with RVR ([place]) This was reportedly new finding. CHADS-VASc Scoreof at least 5  (Age, female, HTN, aortic atherosclerosis on CT). Rate improved after hydration. HR remains controlled. On carvedilol. Continue to monitor  blood pressure closely. Monitor electrolytes and replace as needed. Apixaban per pharmacy due to severe heparin allergy.  # Hypomagnesemia/ Hypokalemia  Replaced. Followand replace as needed.  # Essential tremor Continue primidone.  # Hyponatremia - improving. -NS infusion. Hold hydrochlorothiazide. Follow-up sodium level.  Essential hypertension Hold benazepril and HCTZ. Continue carvedilol 6.25 mg p.o. twice daily. Monitor BP, HR, renal function,electrolytes.  Anxiety Continue alprazolam twice daily as needed.  History of gout Continue allopurinol 100 mg p.o. twice daily.  GERD (gastroesophageal reflux disease) Continue PPI.  Peripheral neuropathy Continue gabapentin.  Anemia Hemoglobin 8.4 today, Denies any GI bleed. Iron level low. FOBT negative, continue to monitor CBC  DVT prophylaxis:SCDs. Code Status:Full code. Family Communication:No family at bedside.  Disposition Plan:Admit for IV antibiotic therapy for 2 to 3 days. Consults called:None   Antimicrobials:  Anti-infectives (From admission, onward)   Start     Dose/Rate Route Frequency Ordered Stop   12/12/18 1800  ceFAZolin (ANCEF) IVPB 1 g/50 mL premix     1 g 100 mL/hr over 30 Minutes Intravenous Every 8 hours 12/12/18 1158     12/11/18 1600  metroNIDAZOLE (FLAGYL) tablet 500 mg     500 mg Oral Every 12 hours 12/11/18 1424 12/14/18 0959   12/10/18 1000  ceFEPIme (MAXIPIME) 2 g in sodium chloride 0.9 % 100 mL IVPB  Status:  Discontinued     2 g 200 mL/hr over 30 Minutes Intravenous Every 12 hours 12/09/18 2218 12/12/18 1158   12/09/18 2145  ceFEPIme (MAXIPIME) 2 g in sodium chloride 0.9 % 100 mL IVPB     2 g 200 mL/hr over 30 Minutes Intravenous  Once 12/09/18 2133 12/09/18 2244   12/09/18 2100  cefTRIAXone (ROCEPHIN) 1  g in sodium chloride 0.9 % 100 mL IVPB  Status:  Discontinued     1 g 200 mL/hr over 30 Minutes Intravenous  Once 12/09/18 2051 12/09/18 2149       Subjective: Patient was seen and examined, no overnight events.  Objective: Vitals:   12/11/18 1401 12/11/18 2010 12/12/18 0528 12/12/18 1450  BP: 133/73 (!) 145/73 (!) 151/78 132/77  Pulse: 88 80 85 79  Resp: 18 16 16 20   Temp: 98.4 F (36.9 C) 97.6 F (36.4 C) 97.6 F (36.4 C) 98.1 F (36.7 C)  TempSrc:  Oral Oral Oral  SpO2: 100% 100% 100% 99%  Weight:      Height:        Intake/Output Summary (Last 24 hours) at 12/12/2018 1711 Last data filed at 12/12/2018 1451 Gross per 24 hour  Intake 829.14 ml  Output 1450 ml  Net -620.86 ml   Filed Weights   12/09/18 1855 12/09/18 1909  Weight: 77.1 kg 79.4 kg    Examination:  General exam: Appears calm and comfortable  Respiratory system: Decreased breath sounds lower lobes. respiratory effort normal. Cardiovascular system: Irregularly irregular, no murmur ,rubs, gallops or clicks. No pedal edema. Gastrointestinal system: Abdomen is nondistended, soft and nontender. No organomegaly or masses felt. Normal bowel sounds heard. Central nervous system: Alert and oriented. No focal neurological deficits. Extremities: Symmetric 5 x 5 power. Skin: No rashes, lesions or ulcers Psychiatry: Judgement and insight appear normal. Mood & affect appropriate.     Data Reviewed: I have personally reviewed following labs and imaging studies  CBC: Recent Labs  Lab 12/09/18 1937 12/10/18 0942 12/11/18 0528 12/11/18 1356 12/12/18 0656  WBC 13.4* 11.8* 9.0  --  8.7  NEUTROABS 10.6* 10.0*  --   --   --   HGB 9.6* 9.0* 7.7* 10.2* 8.4*  HCT 29.7* 28.3* 24.2* 33.9* 26.4*  MCV 95.8 98.3 97.2  --  98.1  PLT 258 226 213  --  123456   Basic Metabolic Panel: Recent Labs  Lab 12/09/18 1937 12/09/18 2310 12/10/18 0942 12/11/18 0528 12/12/18 0656  NA 129*  --  130* 130* 131*  K 3.5  --  3.3* 4.0 3.8  CL 93*  --  101 101 101  CO2 24  --  18* 20* 21*  GLUCOSE 104*  --  163* 87 83  BUN 22  --  14 12 12   CREATININE 1.13*  --  0.76  0.59 0.55  CALCIUM 8.2*  --  7.8* 8.0* 8.4*  MG  --  1.3*  --  1.9  --   PHOS  --  3.3  --   --   --    GFR: Estimated Creatinine Clearance: 56.5 mL/min (by C-G formula based on SCr of 0.55 mg/dL). Liver Function Tests: Recent Labs  Lab 12/09/18 1937 12/10/18 0942  AST 26 16  ALT 30 21  ALKPHOS 103 81  BILITOT 0.3 1.0  PROT 6.0* 5.3*  ALBUMIN 2.6* 2.3*   No results for input(s): LIPASE, AMYLASE in the last 168 hours. No results for input(s): AMMONIA in the last 168 hours. Coagulation Profile: Recent Labs  Lab 12/09/18 1937  INR 1.1   Cardiac Enzymes: No results for input(s): CKTOTAL, CKMB, CKMBINDEX, TROPONINI in the last 168 hours. BNP (last 3 results) No results for input(s): PROBNP in the last 8760 hours. HbA1C: No results for input(s): HGBA1C in the last 72 hours. CBG: Recent Labs  Lab 12/09/18 1904  GLUCAP 89   Lipid  Profile: No results for input(s): CHOL, HDL, LDLCALC, TRIG, CHOLHDL, LDLDIRECT in the last 72 hours. Thyroid Function Tests: No results for input(s): TSH, T4TOTAL, FREET4, T3FREE, THYROIDAB in the last 72 hours. Anemia Panel: Recent Labs    12/10/18 0942  VITAMINB12 789  FOLATE 19.7  FERRITIN 92  TIBC 158*  IRON 11*  RETICCTPCT 1.6   Sepsis Labs: Recent Labs  Lab 12/09/18 1937 12/09/18 2200  LATICACIDVEN 1.2 0.8    Recent Results (from the past 240 hour(s))  Blood Culture (routine x 2)     Status: None (Preliminary result)   Collection Time: 12/09/18  7:37 PM   Specimen: BLOOD  Result Value Ref Range Status   Specimen Description   Final    BLOOD LEFT ANTECUBITAL Performed at [facility], [place] [address], [place], [place] [address]    Special Requests   Final    BOTTLES DRAWN AEROBIC AND ANAEROBIC Blood Culture adequate volume Performed at [facility] [address], [place], [place] [address]    Culture   Final    NO GROWTH 3 DAYS Performed at [facility], [place] [address] [place], [place] [address]    Report Status PENDING  Incomplete  Urine culture     Status: Abnormal   Collection Time: 12/09/18  7:37 PM   Specimen: In/Out Cath Urine  Result Value Ref Range Status   Specimen Description   Final    IN/OUT CATH URINE Performed at [facility] [address] [place], [place] [address]    Special Requests   Final    NONE Performed at [facility], [place] [address], [place], [place] [address]    Culture >=100,000 COLONIES/mL ESCHERICHIA COLI (A)  Final   Report Status 12/12/2018 FINAL  Final   Organism ID, Bacteria ESCHERICHIA COLI (A)  Final      Susceptibility   Escherichia coli - MIC*    AMPICILLIN <=2 SENSITIVE Sensitive     CEFAZOLIN <=4 SENSITIVE Sensitive     CEFTRIAXONE <=1 SENSITIVE Sensitive     CIPROFLOXACIN <=0.25 SENSITIVE Sensitive     GENTAMICIN <=1 SENSITIVE Sensitive     IMIPENEM <=0.25 SENSITIVE Sensitive     NITROFURANTOIN <=16 SENSITIVE Sensitive     TRIMETH/SULFA <=20 SENSITIVE Sensitive     AMPICILLIN/SULBACTAM <=2 SENSITIVE Sensitive     PIP/TAZO <=4 SENSITIVE Sensitive     Extended ESBL NEGATIVE Sensitive     * >=100,000 COLONIES/mL ESCHERICHIA COLI  SARS CORONAVIRUS 2 (TAT 6-24 HRS) Nasopharyngeal Nasopharyngeal Swab     Status: None   Collection Time: 12/09/18  7:37 PM   Specimen: Nasopharyngeal Swab  Result Value Ref Range Status   SARS Coronavirus 2 NEGATIVE NEGATIVE Final    Comment: (NOTE) SARS-CoV-2 target nucleic acids are NOT DETECTED. The SARS-CoV-2 RNA is generally detectable in upper and lower respiratory specimens during the acute phase of infection. Negative results do not preclude SARS-CoV-2 infection, do not rule out co-infections with other pathogens, and should not be used as the sole basis for treatment or other patient management decisions. Negative results must be combined with clinical observations, patient history, and epidemiological information. The  expected result is Negative. Fact Sheet for Patients: SugarRoll.be Fact Sheet for Healthcare Providers: https://www.woods-mathews.com/ This test is not yet approved or cleared by the [place] FDA and  has been authorized for detection and/or diagnosis of SARS-CoV-2 by FDA under an Emergency Use Authorization (EUA). This EUA will remain  in effect (meaning this test can be used) for the duration of the COVID-19 declaration under Section 56 4(b)(1) of the Act, 21 U.S.C. section 360bbb-3(b)(1), unless the authorization is terminated or revoked sooner. Performed at [facility], [place] [address], [place], [place] [address]   C difficile quick scan w PCR reflex     Status: None   Collection Time: 12/10/18 11:08 AM   Specimen: STOOL  Result Value Ref Range Status   C Diff antigen NEGATIVE NEGATIVE Final   C Diff toxin NEGATIVE NEGATIVE Final   C Diff interpretation No C. difficile detected.  Final    Comment: Performed at [facility], [place] [address] [place], [place] [address]    Radiology Studies: No results found.   Scheduled Meds:  allopurinol  100 mg Oral BID   apixaban  5 mg Oral BID   carvedilol  6.25 mg Oral BID WC   cholecalciferol  2,000 Units Oral Daily   gabapentin  300 mg Oral BID   metroNIDAZOLE  500 mg Oral Q12H   Muscle Rub   Topical Daily   nystatin-triamcinolone  1 application Topical BID   pantoprazole  40 mg Oral Daily   predniSONE  40 mg Oral QAC breakfast   primidone  150 mg Oral Daily   And   primidone  100 mg Oral QHS   tamsulosin  0.4 mg Oral Daily   Continuous Infusions:  sodium chloride 250 mL (12/10/18 2039)    ceFAZolin (ANCEF) IV       LOS: 3 days    Time spent:    Shawna Clamp, MD [facility]   If 7PM-7AM, please contact night-coverage

## 2018-12-13 LAB — COMPREHENSIVE METABOLIC PANEL
ALT: 52 U/L — ABNORMAL HIGH (ref 0–44)
AST: 40 U/L (ref 15–41)
Albumin: 2.3 g/dL — ABNORMAL LOW (ref 3.5–5.0)
Alkaline Phosphatase: 80 U/L (ref 38–126)
Anion gap: 10 (ref 5–15)
BUN: 12 mg/dL (ref 8–23)
CO2: 24 mmol/L (ref 22–32)
Calcium: 8.7 mg/dL — ABNORMAL LOW (ref 8.9–10.3)
Chloride: 99 mmol/L (ref 98–111)
Creatinine, Ser: 0.69 mg/dL (ref 0.44–1.00)
GFR calc Af Amer: >60 mL/min (ref >60)
GFR calc non Af Amer: >60 mL/min (ref >60)
Glucose, Bld: 84 mg/dL (ref 70–99)
Potassium: 3.7 mmol/L (ref 3.5–5.1)
Sodium: 133 mmol/L — ABNORMAL LOW (ref 135–145)
Total Bilirubin: 0.5 mg/dL (ref 0.3–1.2)
Total Protein: 5.5 g/dL — ABNORMAL LOW (ref 6.5–8.1)

## 2018-12-13 LAB — CBC
HCT: 28 % — ABNORMAL LOW (ref 36.0–46.0)
Hemoglobin: 8.9 g/dL — ABNORMAL LOW (ref 12.0–15.0)
MCH: 30.7 pg (ref 26.0–34.0)
MCHC: 31.8 g/dL (ref 30.0–36.0)
MCV: 96.6 fL (ref 80.0–100.0)
Platelets: 234 10*3/uL (ref 150–400)
RBC: 2.9 MIL/uL — ABNORMAL LOW (ref 3.87–5.11)
RDW: 14.7 % (ref 11.5–15.5)
WBC: 10 10*3/uL (ref 4.0–10.5)
nRBC: 0 % (ref 0.0–0.2)

## 2018-12-13 LAB — MAGNESIUM: Magnesium: 1.7 mg/dL (ref 1.7–2.4)

## 2018-12-13 NOTE — Progress Notes (Signed)
PHARMACY NOTE -  Eliquis  Pharmacy has been assisting with dosing of Eliquis for Afib.  Dosage remains stable at 5 mg PO BID and need for further dosage adjustment appears unlikely at present given SCr at baseline  Pharmacy will sign off, following peripherally for culture results or dose adjustments. Please reconsult if a change in clinical status warrants re-evaluation of dosage.  Reuel Boom, PharmD, BCPS [phone] 12/13/2018, 11:34 AM

## 2018-12-13 NOTE — Progress Notes (Signed)
PROGRESS NOTE    Caitlin Chavez  [id number] DOB: April 09, 1934 DOA: 12/09/2018 PCP: Sueanne Margarita, DO   Brief Narrative:  Caitlin Chavez a 84 y.o.femalewith medical history significant ofanxiety, osteoarthritis of the hands, carpal tunnel syndrome, colon polyps, diverticulosis, GERD, hiatal hernia, hypertension, macular degeneration, peripheral neuropathy who was recently admitted for FUO on 10/29/2018, discharged to a rehab facility, returning home on on 12/06/2018 nowpresentedto the ED due to fever, weakness, confusion and diarrhea.Person she was well for the first day and a half, then started having significant urinary urgency. She had some loose stool episodes. ED Course:Her initial vital signs temperature 101.6 F, pulse 105, respirations 16, blood pressure 134/66 mmHg and O2 sat 97% on room air. The patient received a 1000 mL NS bolus.  UA showed evidence of urinary tract infection.  Urine cultures preliminary result showing greater than 100,000 colonies per mL of gram-negative rods.  Final culture still pending at this time.   Assessment & Plan:   Principal Problem:   Sepsis secondary to UTI [facility]) Active Problems:   Essential tremor   Hyponatremia   Essential hypertension   Anxiety   History of gout   GERD (gastroesophageal reflux disease)   Peripheral neuropathy   Anemia   Atrial fibrillation with RVR (HCC)   Hypomagnesemia  # Sepsis secondary to UTI (HCC)-improving -Continue gentle IV hydration for now. -Continue cefepime for now.changed to Ancef as per pharmacy recommendations. 12/11/2018: Preliminary urine culture showing greater than 100,000 colonies per mL of gram-negative rods. Follow-up urine culture and sensitivity.  Blood cultures do not show any growth. We can discharge on oral antibiotics.   # Atrial fibrillation with RVR ([place]) This was reportedly new finding. CHADS-VASc Scoreof at least 5  (Age, female, HTN, aortic atherosclerosis on CT).  Rate improved after hydration. HR remains controlled. On carvedilol. Continue to monitor blood pressure closely. Monitor electrolytes and replace as needed. Apixaban per pharmacy due to severe heparin allergy.  # Hypomagnesemia/ Hypokalemia  Replaced. Followand replace as needed.  # Essential tremor Continue primidone.  # Hyponatremia - improving. -NS infusion. Hold hydrochlorothiazide. Follow-up sodium level.  Essential hypertension Hold benazepril and HCTZ. Continue carvedilol 6.25 mg p.o. twice daily. Monitor BP, HR, renal function,electrolytes.  Anxiety Continue alprazolam twice daily as needed.  History of gout Continue allopurinol 100 mg p.o. twice daily.  GERD (gastroesophageal reflux disease) Continue PPI.  Peripheral neuropathy Continue gabapentin.  Anemia Hemoglobin 8.9 today, Denies any GI bleed. Iron level low. FOBT negative, continue to monitor CBC  DVT prophylaxis:SCDs. Code Status:Full code. Family Communication:No family at bedside.  Disposition Plan:Admitted for IV antibiotic therapy for 2 to 3 days. Anticipated Discharge tomorrow. Consults called:None   Antimicrobials:  Anti-infectives (From admission, onward)   Start     Dose/Rate Route Frequency Ordered Stop   12/12/18 1800  ceFAZolin (ANCEF) IVPB 1 g/50 mL premix     1 g 100 mL/hr over 30 Minutes Intravenous Every 8 hours 12/12/18 1158     12/11/18 1600  metroNIDAZOLE (FLAGYL) tablet 500 mg     500 mg Oral Every 12 hours 12/11/18 1424 12/14/18 0959   12/10/18 1000  ceFEPIme (MAXIPIME) 2 g in sodium chloride 0.9 % 100 mL IVPB  Status:  Discontinued     2 g 200 mL/hr over 30 Minutes Intravenous Every 12 hours 12/09/18 2218 12/12/18 1158   12/09/18 2145  ceFEPIme (MAXIPIME) 2 g in sodium chloride 0.9 % 100 mL IVPB     2 g 200 mL/hr over  30 Minutes Intravenous  Once 12/09/18 2133 12/09/18 2244   12/09/18 2100  cefTRIAXone (ROCEPHIN) 1 g in sodium chloride 0.9  % 100 mL IVPB  Status:  Discontinued     1 g 200 mL/hr over 30 Minutes Intravenous  Once 12/09/18 2051 12/09/18 2149      Subjective: Patient was seen and examined, no overnight events.  She still feels weak, does not feel good to be discharged today.  Objective: Vitals:   12/12/18 1450 12/12/18 2100 12/13/18 0552 12/13/18 1326  BP: 132/77 137/72 (!) 143/79 129/62  Pulse: 79 88 73 79  Resp: 20 19 20 18   Temp: 98.1 F (36.7 C) 98.3 F (36.8 C) (!) 97.5 F (36.4 C) (!) 97.5 F (36.4 C)  TempSrc: Oral Oral Oral Oral  SpO2: 99% 98% 99% 99%  Weight:   74.9 kg   Height:        Intake/Output Summary (Last 24 hours) at 12/13/2018 1331 Last data filed at 12/13/2018 1000 Gross per 24 hour  Intake 192.63 ml  Output 1050 ml  Net -857.37 ml   Filed Weights   12/09/18 1855 12/09/18 1909 12/13/18 0552  Weight: 77.1 kg 79.4 kg 74.9 kg    Examination:  General exam: Appears calm and comfortable  Respiratory system: Decreased breath sounds lower lobes. respiratory effort normal. Cardiovascular system: Irregularly irregular, no murmur ,rubs, gallops or clicks. No pedal edema. Gastrointestinal system: Abdomen is nondistended, soft and nontender. No organomegaly or masses felt. Normal bowel sounds heard. Central nervous system: Alert and oriented. No focal neurological deficits. Extremities: Symmetric 5 x 5 power. Skin: No rashes, lesions or ulcers Psychiatry: Judgement and insight appear normal. Mood & affect appropriate.     Data Reviewed: I have personally reviewed following labs and imaging studies  CBC: Recent Labs  Lab 12/09/18 1937 12/10/18 0942 12/11/18 0528 12/11/18 1356 12/12/18 0656 12/13/18 0606  WBC 13.4* 11.8* 9.0  --  8.7 10.0  NEUTROABS 10.6* 10.0*  --   --   --   --   HGB 9.6* 9.0* 7.7* 10.2* 8.4* 8.9*  HCT 29.7* 28.3* 24.2* 33.9* 26.4* 28.0*  MCV 95.8 98.3 97.2  --  98.1 96.6  PLT 258 226 213  --  225 [id number]   Basic Metabolic Panel: Recent Labs  Lab  12/09/18 1937 12/09/18 2310 12/10/18 0942 12/11/18 0528 12/12/18 0656 12/13/18 0606  NA 129*  --  130* 130* 131* 133*  K 3.5  --  3.3* 4.0 3.8 3.7  CL 93*  --  101 101 101 99  CO2 24  --  18* 20* 21* 24  GLUCOSE 104*  --  163* 87 83 84  BUN 22  --  14 12 12 12   CREATININE 1.13*  --  0.76 0.59 0.55 0.69  CALCIUM 8.2*  --  7.8* 8.0* 8.4* 8.7*  MG  --  1.3*  --  1.9  --  1.7  PHOS  --  3.3  --   --   --   --    GFR: Estimated Creatinine Clearance: 54.9 mL/min (by C-G formula based on SCr of 0.69 mg/dL). Liver Function Tests: Recent Labs  Lab 12/09/18 1937 12/10/18 0942 12/13/18 0606  AST 26 16 40  ALT 30 21 52*  ALKPHOS 103 81 80  BILITOT 0.3 1.0 0.5  PROT 6.0* 5.3* 5.5*  ALBUMIN 2.6* 2.3* 2.3*   No results for input(s): LIPASE, AMYLASE in the last 168 hours. No results for input(s): AMMONIA in the last  168 hours. Coagulation Profile: Recent Labs  Lab 12/09/18 1937  INR 1.1   Cardiac Enzymes: No results for input(s): CKTOTAL, CKMB, CKMBINDEX, TROPONINI in the last 168 hours. BNP (last 3 results) No results for input(s): PROBNP in the last 8760 hours. HbA1C: No results for input(s): HGBA1C in the last 72 hours. CBG: Recent Labs  Lab 12/09/18 1904  GLUCAP 89   Lipid Profile: No results for input(s): CHOL, HDL, LDLCALC, TRIG, CHOLHDL, LDLDIRECT in the last 72 hours. Thyroid Function Tests: No results for input(s): TSH, T4TOTAL, FREET4, T3FREE, THYROIDAB in the last 72 hours. Anemia Panel: No results for input(s): VITAMINB12, FOLATE, FERRITIN, TIBC, IRON, RETICCTPCT in the last 72 hours. Sepsis Labs: Recent Labs  Lab 12/09/18 1937 12/09/18 2200  LATICACIDVEN 1.2 0.8    Recent Results (from the past 240 hour(s))  Blood Culture (routine x 2)     Status: None (Preliminary result)   Collection Time: 12/09/18  7:37 PM   Specimen: BLOOD  Result Value Ref Range Status   Specimen Description   Final    BLOOD LEFT ANTECUBITAL Performed at [facility] [address], [place], [place] [address]    Special Requests   Final    BOTTLES DRAWN AEROBIC AND ANAEROBIC Blood Culture adequate volume Performed at [facility] [address] [place], [place] [address]    Culture   Final    NO GROWTH 3 DAYS Performed at [facility], [place] [address] [place], [place] [address]    Report Status PENDING  Incomplete  Urine culture     Status: Abnormal   Collection Time: 12/09/18  7:37 PM   Specimen: In/Out Cath Urine  Result Value Ref Range Status   Specimen Description   Final    IN/OUT CATH URINE Performed at [facility] [address] [place], [place] [address]    Special Requests   Final    NONE Performed at [facility], [place] [address], [place], [place] [address]    Culture >=100,000 COLONIES/mL ESCHERICHIA COLI (A)  Final   Report Status 12/12/2018 FINAL  Final   Organism ID, Bacteria ESCHERICHIA COLI (A)  Final      Susceptibility   Escherichia coli - MIC*    AMPICILLIN <=2 SENSITIVE Sensitive     CEFAZOLIN <=4 SENSITIVE Sensitive     CEFTRIAXONE <=1 SENSITIVE Sensitive     CIPROFLOXACIN <=0.25 SENSITIVE Sensitive     GENTAMICIN <=1 SENSITIVE Sensitive     IMIPENEM <=0.25 SENSITIVE Sensitive     NITROFURANTOIN <=16 SENSITIVE Sensitive     TRIMETH/SULFA <=20 SENSITIVE Sensitive     AMPICILLIN/SULBACTAM <=2 SENSITIVE Sensitive     PIP/TAZO <=4 SENSITIVE Sensitive     Extended ESBL NEGATIVE Sensitive     * >=100,000 COLONIES/mL ESCHERICHIA COLI  SARS CORONAVIRUS 2 (TAT 6-24 HRS) Nasopharyngeal Nasopharyngeal Swab     Status: None   Collection Time: 12/09/18  7:37 PM   Specimen: Nasopharyngeal Swab  Result Value Ref Range Status   SARS Coronavirus 2 NEGATIVE NEGATIVE Final    Comment: (NOTE) SARS-CoV-2 target nucleic acids are NOT DETECTED. The SARS-CoV-2 RNA is generally detectable in upper and lower respiratory specimens during the acute  phase of infection. Negative results do not preclude SARS-CoV-2 infection, do not rule out co-infections with other pathogens, and should not be used as the sole basis for treatment or other patient management decisions. Negative results must be combined with clinical observations, patient  history, and epidemiological information. The expected result is Negative. Fact Sheet for Patients: SugarRoll.be Fact Sheet for Healthcare Providers: https://www.woods-mathews.com/ This test is not yet approved or cleared by the [place] FDA and  has been authorized for detection and/or diagnosis of SARS-CoV-2 by FDA under an Emergency Use Authorization (EUA). This EUA will remain  in effect (meaning this test can be used) for the duration of the COVID-19 declaration under Section 56 4(b)(1) of the Act, 21 U.S.C. section 360bbb-3(b)(1), unless the authorization is terminated or revoked sooner. Performed at [facility], [place] [address], [place], [place] [address]   C difficile quick scan w PCR reflex     Status: None   Collection Time: 12/10/18 11:08 AM   Specimen: STOOL  Result Value Ref Range Status   C Diff antigen NEGATIVE NEGATIVE Final   C Diff toxin NEGATIVE NEGATIVE Final   C Diff interpretation No C. difficile detected.  Final    Comment: Performed at [facility], [place] [address], [place], [place] [address]    Radiology Studies: No results found.   Scheduled Meds: . allopurinol  100 mg Oral BID  . apixaban  5 mg Oral BID  . carvedilol  6.25 mg Oral BID WC  . cholecalciferol  2,000 Units Oral Daily  . gabapentin  300 mg Oral BID  . metroNIDAZOLE  500 mg Oral Q12H  . Muscle Rub   Topical Daily  . nystatin-triamcinolone  1 application Topical BID  . pantoprazole  40 mg Oral Daily  . predniSONE  40 mg Oral QAC breakfast  . primidone  150 mg Oral Daily   And  . primidone  100 mg Oral QHS  . tamsulosin  0.4 mg  Oral Daily   Continuous Infusions: . sodium chloride Stopped (12/12/18 0927)  .  ceFAZolin (ANCEF) IV 1 g (12/13/18 0911)     LOS: 4 days    Time spent:    Shawna Clamp, MD [facility]   If 7PM-7AM, please contact night-coverage

## 2018-12-14 LAB — CBC
HCT: 25.2 % — ABNORMAL LOW (ref 36.0–46.0)
Hemoglobin: 7.9 g/dL — ABNORMAL LOW (ref 12.0–15.0)
MCH: 30.6 pg (ref 26.0–34.0)
MCHC: 31.3 g/dL (ref 30.0–36.0)
MCV: 97.7 fL (ref 80.0–100.0)
Platelets: 225 10*3/uL (ref 150–400)
RBC: 2.58 MIL/uL — ABNORMAL LOW (ref 3.87–5.11)
RDW: 14.8 % (ref 11.5–15.5)
WBC: 9.1 10*3/uL (ref 4.0–10.5)
nRBC: 0 % (ref 0.0–0.2)

## 2018-12-14 LAB — BASIC METABOLIC PANEL
Anion gap: 10 (ref 5–15)
BUN: 12 mg/dL (ref 8–23)
CO2: 23 mmol/L (ref 22–32)
Calcium: 8.5 mg/dL — ABNORMAL LOW (ref 8.9–10.3)
Chloride: 99 mmol/L (ref 98–111)
Creatinine, Ser: 0.59 mg/dL (ref 0.44–1.00)
GFR calc Af Amer: >60 mL/min (ref >60)
GFR calc non Af Amer: >60 mL/min (ref >60)
Glucose, Bld: 85 mg/dL (ref 70–99)
Potassium: 3.6 mmol/L (ref 3.5–5.1)
Sodium: 132 mmol/L — ABNORMAL LOW (ref 135–145)

## 2018-12-14 LAB — CULTURE, BLOOD (ROUTINE X 2)
Culture: NO GROWTH
Special Requests: ADEQUATE

## 2018-12-14 MED ORDER — PREDNISONE 20 MG PO TABS
40.0000 mg | ORAL_TABLET | Freq: Every day | ORAL | Status: DC
  Administered 2018-12-15: 40 mg via ORAL
  Filled 2018-12-14: qty 2

## 2018-12-14 NOTE — Progress Notes (Signed)
Physical Therapy Treatment Patient Details Name: Caitlin Chavez MRN: [id number] DOB: April 10, 1934 Today's Date: 12/14/2018    History of Present Illness Caitlin Chavez is a 84 y.o. female with medical history significant of anxiety, osteoarthritis of the hands, carpal tunnel syndrome, colon polyps, diverticulosis, GERD, hiatal hernia, hypertension, macular degeneration, peripheral neuropathy who was at rehab for recent hospitalization and then returned home on 12/06/2018. She came to the ED due to fever, weakness, confusion and diarrhea.    PT Comments    Pt agreeable to ambulate today.  "I've been lazy".  She required MIN/guard with ambulation, but MOD A to achieve standing.  Pt demonstrates some confusion at times.  She is declining SNF.  Recommend HHPT and 24/7 S.   Follow Up Recommendations  Home health PT;Supervision/Assistance - 24 hour     Equipment Recommendations  3in1 (PT)    Recommendations for Other Services       Precautions / Restrictions Precautions Precautions: Fall Restrictions Weight Bearing Restrictions: No    Mobility  Bed Mobility Overal bed mobility: Needs Assistance Bed Mobility: Supine to Sit     Supine to sit: Min guard Sit to supine: Mod assist   General bed mobility comments: pt able to get to EOB with use of rail, but moved very slowly and needed increased time.  Transfers Overall transfer level: Needs assistance Equipment used: Rolling walker (2 wheeled) Transfers: Sit to/from Stand Sit to Stand: Mod assist         General transfer comment: MOD A with cues to power up and hand placement  Ambulation/Gait Ambulation/Gait assistance: Min guard Gait Distance (Feet): 20 Feet Assistive device: Rolling walker (2 wheeled) Gait Pattern/deviations: Decreased step length - right;Decreased step length - left     General Gait Details: Pt ambulated in room and negiotiated tight spaces/turns fairly well with RW   Stairs              Wheelchair Mobility    Modified Rankin (Stroke Patients Only)       Balance Overall balance assessment: Needs assistance   Sitting balance-Leahy Scale: Fair       Standing balance-Leahy Scale: Poor Standing balance comment: requires UE support                            Cognition Arousal/Alertness: Awake/alert Behavior During Therapy: WFL for tasks assessed/performed Overall Cognitive Status: No family/caregiver present to determine baseline cognitive functioning Area of Impairment: Memory;Problem solving                     Memory: Decreased short-term memory       Problem Solving: Slow processing;Difficulty sequencing;Requires verbal cues(to use house phone) General Comments: Pt thought it was the morning      Exercises      General Comments        Pertinent Vitals/Pain Pain Assessment: No/denies pain    Home Living                      Prior Function            PT Goals (current goals can now be found in the care plan section) Acute Rehab PT Goals Patient Stated Goal: go home Potential to Achieve Goals: Good Progress towards PT goals: Progressing toward goals    Frequency    Min 3X/week      PT Plan Current plan remains appropriate  Co-evaluation              AM-PAC PT "6 Clicks" Mobility   Outcome Measure  Help needed turning from your back to your side while in a flat bed without using bedrails?: A Little Help needed moving from lying on your back to sitting on the side of a flat bed without using bedrails?: A Lot Help needed moving to and from a bed to a chair (including a wheelchair)?: A Little Help needed standing up from a chair using your arms (e.g., wheelchair or bedside chair)?: A Lot Help needed to walk in hospital room?: A Little Help needed climbing 3-5 steps with a railing? : A Lot 6 Click Score: 15    End of Session Equipment Utilized During Treatment: Gait belt Activity  Tolerance: Patient limited by fatigue Patient left: in chair;with call bell/phone within reach;with nursing/sitter in room;with chair alarm set Nurse Communication: Mobility status PT Visit Diagnosis: Difficulty in walking, not elsewhere classified (R26.2);Muscle weakness (generalized) (M62.81)     Time: [id number] PT Time Calculation (min) (ACUTE ONLY): 17 min  Charges:  $Gait Training: 8-22 mins                     Kaid Seeberger L. Tamala Julian, [place] Pager [phone] 12/14/2018    Galen Manila 12/14/2018, 3:49 PM

## 2018-12-14 NOTE — Progress Notes (Signed)
Occupational Therapy Treatment Patient Details Name: Caitlin Chavez MRN: [id number] DOB: 1934-03-26 Today's Date: 12/14/2018    History of present illness Caitlin Chavez is a 84 y.o. female with medical history significant of anxiety, osteoarthritis of the hands, carpal tunnel syndrome, colon polyps, diverticulosis, GERD, hiatal hernia, hypertension, macular degeneration, peripheral neuropathy who was at rehab for recent hospitalization and then returned home on 12/06/2018. She came to the ED due to fever, weakness, confusion and diarrhea.   OT comments  Pt initially eager to work with OT. Assisted pt to EOB. Pt received a phone call and answered it. Sat EOB x 10 minutes as OT set up room for OOB activity. Pt continued to talk on the phone, disregarding OT. Assisted pt back to supine.   Follow Up Recommendations  Home health OT    Equipment Recommendations  None recommended by OT    Recommendations for Other Services      Precautions / Restrictions Precautions Precautions: Fall Restrictions Weight Bearing Restrictions: No       Mobility Bed Mobility Overal bed mobility: Needs Assistance Bed Mobility: Supine to Sit;Sit to Supine     Supine to sit: Mod assist Sit to supine: Mod assist   General bed mobility comments: assist to raise trunk and position hips at EOB with bed pad, assisted LEs back into bed  Transfers                      Balance Overall balance assessment: Needs assistance   Sitting balance-Leahy Scale: Fair                                     ADL either performed or assessed with clinical judgement   ADL                       Lower Body Dressing: Total assistance;Bed level Lower Body Dressing Details (indicate cue type and reason): for socks                     Vision       Perception     Praxis      Cognition Arousal/Alertness: Awake/alert Behavior During Therapy: WFL for tasks  assessed/performed Overall Cognitive Status: Impaired/Different from baseline Area of Impairment: Memory;Problem solving                     Memory: Decreased short-term memory       Problem Solving: Slow processing;Difficulty sequencing;Requires verbal cues(to use house phone) General Comments: "I don't even know what I ate for breakfast."        Exercises     Shoulder Instructions       General Comments      Pertinent Vitals/ Pain       Pain Assessment: No/denies pain  Home Living                                          Prior Functioning/Environment              Frequency  Min 2X/week        Progress Toward Goals  OT Goals(current goals can now be found in the care plan section)  Progress towards OT goals: Not progressing toward goals - comment(pt  distracted by phone call)  Acute Rehab OT Goals Patient Stated Goal: go home OT Goal Formulation: With patient Time For Goal Achievement: 12/25/18 Potential to Achieve Goals: Good  Plan Discharge plan remains appropriate    Co-evaluation                 AM-PAC OT "6 Clicks" Daily Activity     Outcome Measure   Help from another person eating meals?: A Lot Help from another person taking care of personal grooming?: A Lot Help from another person toileting, which includes using toliet, bedpan, or urinal?: A Lot Help from another person bathing (including washing, rinsing, drying)?: A Lot Help from another person to put on and taking off regular upper body clothing?: A Lot Help from another person to put on and taking off regular lower body clothing?: A Lot 6 Click Score: 12    End of Session    OT Visit Diagnosis: Unsteadiness on feet (R26.81);Other abnormalities of gait and mobility (R26.89);Repeated falls (R29.6);History of falling (Z91.81)   Activity Tolerance Patient tolerated treatment well   Patient Left in bed;with call bell/phone within reach   Nurse  Communication          Time: [id number] OT Time Calculation (min): 21 min  Charges: OT General Charges $OT Visit: 1 Visit OT Treatments $Therapeutic Activity: 8-22 mins  Nestor Lewandowsky, OTR/L Acute Rehabilitation Services Pager: [phone] Office: [phone]   Malka So 12/14/2018, 1:39 PM

## 2018-12-14 NOTE — Progress Notes (Signed)
PROGRESS NOTE    Caitlin Chavez  [id number] DOB: 23-Dec-1934 DOA: 12/09/2018 PCP: Sueanne Margarita, DO   Brief Narrative:  Caitlin Cowley Caudleis a 84 y.o.femalewith medical history significant ofanxiety, recent Giardia infection with inadequate treatment, osteoarthritis of the hands, carpal tunnel syndrome, colon polyps, diverticulosis, GERD, hiatal hernia, hypertension, macular degeneration, peripheral neuropathy who was recently admitted for FUO on 10/29/2018, discharged to a rehab facility, returning home on on 12/06/2018 nowpresentedto the ED due to fever, weakness, confusion and diarrhea.Person she was well for the first day and a half, then started having significant urinary urgency. She had some loose stool episodes. ED Course:Her initial vital signs temperature 101.6 F, pulse 105, respirations 16, blood pressure 134/66 mmHg and O2 sat 97% on room air. The patient received a 1000 mL NS bolus.  UA showed evidence of urinary tract infection.   Urine culture positive for E coli infection.   Assessment & Plan:   Principal Problem:   Sepsis secondary to UTI [facility]) Active Problems:   Essential tremor   Hyponatremia   Essential hypertension   Anxiety   History of gout   GERD (gastroesophageal reflux disease)   Peripheral neuropathy   Anemia   Atrial fibrillation with RVR (HCC)   Hypomagnesemia  # Sepsis secondary to E coli UTI  completed 5/5 days antibiotics sepsis resolved. Hemodynamically stable on room air, Awake and alert answering questions appropriately -Discontinue antibiotics  #New onset atrial fibrillation with RVR ([place]) CHADS-VASc Scoreof at least 5  (Age, female, HTN, aortic atherosclerosis on CT). HR remains controlled. On carvedilol. Continue to monitor blood pressure closely. Monitor electrolytes and replace as needed. -Continue apixaban and prescribe at discharge  Persistent diarrhea/loose stools suspect secondary to recent Giardia infection which  was inadequately treated Culture and treatment poorly documented and only noted in stool culture from 11/04/2018.  Discharge summary from 10/8 noted persistent diarrhea and GI pathogen PCR sent at that time and diarrhea thought secondary to colchicine and patient was given Imodium at that time. I do not see any follow up notes addressing giardia or treatment since that time. Seems to have only received 5 doses of metronidazole (2.5 days) this hospitalization which was actually directed towards sepsis for UTI and not Giardia.  Patient still having loose stools per son and bedside nurse, but patient denies this (son reports she will say what she needs to in order to be discharged)  C. difficile negative 11/11. -Repeat Stool PCR today -Will likely need to restart Giardia directed treatment   # Hypomagnesemia/ Hypokalemia Resolved  Suspected underlying dementia -outpatient follow up  # Essential tremor Continue primidone.  #Chronic hyponatremia  above baseline and stable Currently 132, baseline 127 -Hold hydrochlorothiazide. -Hold IV fluids  Essential hypertension Hold benazepril and HCTZ. Continue carvedilol 6.25 mg p.o. twice daily. Monitor BP, HR, renal function,electrolytes.  Anxiety Continue alprazolam twice daily as needed.  History of gout Stable. Was discharged on 7 day steroid dose on 10/8. Currently on prednisone 40 mg daily, she does not appear to be in gout flare. Unknown how long she has actually been taking this dose -Continue allopurinol 100 mg p.o. twice daily -Continue Prednisone 40 mg for now and will find out tomorrow how long this has been on and consider completely stopping vs. Taper to prevent flare/adrenal insufficiency  GERD (gastroesophageal reflux disease) Continue PPI.  Peripheral neuropathy Continue gabapentin.  Anemia of chronic disease Hemoglobin 8.9 today, Denies any GI bleed. Iron studies indicate AOCD FOBT negative, continue to  monitor  CBC  DVT prophylaxis:Eliquis. Code Status:Full code.  Family Communication: Long and detailed discussion with son by phone today who was very upset (understandably) that his mother has not been adequately treated for her recent giardia diagnosis (10/6) and was very upset that this has not been addressed. He was adamant that the patient stay for further workup and treatment plan. I apologized and explained to him that unfortunately this does not seem to have been documented well in the past but I agree with keeping the patient hospitalized pending further workup. I explained that this will be sorted out and he will be notified daily of any changes going forward by myself. I apologized once again and expressed clear understanding of his concerns. Son was appreciative of our discussion. All of his questions and concerns were addressed and he was in agreement with the current plan.    Disposition Plan:DC pending stool studies and diarrhea treatment plan as well as steroid adjustment as above. DC with home health.   Consults called:None   Antimicrobials:  Anti-infectives (From admission, onward)   Start     Dose/Rate Route Frequency Ordered Stop   12/12/18 1800  ceFAZolin (ANCEF) IVPB 1 g/50 mL premix     1 g 100 mL/hr over 30 Minutes Intravenous Every 8 hours 12/12/18 1158     12/11/18 1600  metroNIDAZOLE (FLAGYL) tablet 500 mg     500 mg Oral Every 12 hours 12/11/18 1424 12/13/18 2212   12/10/18 1000  ceFEPIme (MAXIPIME) 2 g in sodium chloride 0.9 % 100 mL IVPB  Status:  Discontinued     2 g 200 mL/hr over 30 Minutes Intravenous Every 12 hours 12/09/18 2218 12/12/18 1158   12/09/18 2145  ceFEPIme (MAXIPIME) 2 g in sodium chloride 0.9 % 100 mL IVPB     2 g 200 mL/hr over 30 Minutes Intravenous  Once 12/09/18 2133 12/09/18 2244   12/09/18 2100  cefTRIAXone (ROCEPHIN) 1 g in sodium chloride 0.9 % 100 mL IVPB  Status:  Discontinued     1 g 200 mL/hr over 30 Minutes Intravenous  Once  12/09/18 2051 12/09/18 2149      Subjective: Patient seen and examined at bedside no acute distress and resting comfortably.  No events overnight.  Tolerating diet.  Denies any chest pain, shortness of breath, fever, nausea, vomiting, urinary complaints.  Admits to having bowel movement.  Otherwise ROS negative  Nursing states that the patient is having multiple loose/soft BMs daily  Son states that the patient will falsely deny symptoms on occasion.   Objective: Vitals:   12/14/18 0556 12/14/18 0808 12/14/18 1409 12/14/18 2033  BP: (!) 142/66 140/79 132/68 134/68  Pulse: 89 99 75 64  Resp: 18 16 16 17   Temp: 98.5 F (36.9 C) 98.3 F (36.8 C) 98.4 F (36.9 C) 97.9 F (36.6 C)  TempSrc: Oral Oral Oral Oral  SpO2: 98% 98% 99% 98%  Weight:      Height:        Intake/Output Summary (Last 24 hours) at 12/14/2018 2041 Last data filed at 12/14/2018 1400 Gross per 24 hour  Intake 160 ml  Output 850 ml  Net -690 ml   Filed Weights   12/09/18 1855 12/09/18 1909 12/13/18 0552  Weight: 77.1 kg 79.4 kg 74.9 kg    Examination:  General exam: Appears calm and comfortable. Awake and alert and responding to questions appropriately Respiratory system: clear to auscultation bilaterally without wheeze Cardiovascular system: Regular rate, irregular rhythm. S1 and S2  heard Gastrointestinal system: nondistended, non tender, normoactive bowel sounds Central nervous system:  No focal neurological deficits. Extremities: no lower extremity edema, no signs of trauma noted Skin: No rashes, lesions or ulcers Psychiatry:  Mood & affect appropriate.     Data Reviewed: I have personally reviewed following labs and imaging studies  CBC: Recent Labs  Lab 12/09/18 1937 12/10/18 0942 12/11/18 0528 12/11/18 1356 12/12/18 0656 12/13/18 0606 12/14/18 0829  WBC 13.4* 11.8* 9.0  --  8.7 10.0 9.1  NEUTROABS 10.6* 10.0*  --   --   --   --   --   HGB 9.6* 9.0* 7.7* 10.2* 8.4* 8.9* 7.9*  HCT  29.7* 28.3* 24.2* 33.9* 26.4* 28.0* 25.2*  MCV 95.8 98.3 97.2  --  98.1 96.6 97.7  PLT 258 226 213  --  225 234 [id number]   Basic Metabolic Panel: Recent Labs  Lab 12/09/18 2310 12/10/18 0942 12/11/18 0528 12/12/18 0656 12/13/18 0606 12/14/18 0829  NA  --  130* 130* 131* 133* 132*  K  --  3.3* 4.0 3.8 3.7 3.6  CL  --  101 101 101 99 99  CO2  --  18* 20* 21* 24 23  GLUCOSE  --  163* 87 83 84 85  BUN  --  14 12 12 12 12   CREATININE  --  0.76 0.59 0.55 0.69 0.59  CALCIUM  --  7.8* 8.0* 8.4* 8.7* 8.5*  MG 1.3*  --  1.9  --  1.7  --   PHOS 3.3  --   --   --   --   --    GFR: Estimated Creatinine Clearance: 54.9 mL/min (by C-G formula based on SCr of 0.59 mg/dL). Liver Function Tests: Recent Labs  Lab 12/09/18 1937 12/10/18 0942 12/13/18 0606  AST 26 16 40  ALT 30 21 52*  ALKPHOS 103 81 80  BILITOT 0.3 1.0 0.5  PROT 6.0* 5.3* 5.5*  ALBUMIN 2.6* 2.3* 2.3*   No results for input(s): LIPASE, AMYLASE in the last 168 hours. No results for input(s): AMMONIA in the last 168 hours. Coagulation Profile: Recent Labs  Lab 12/09/18 1937  INR 1.1   Cardiac Enzymes: No results for input(s): CKTOTAL, CKMB, CKMBINDEX, TROPONINI in the last 168 hours. BNP (last 3 results) No results for input(s): PROBNP in the last 8760 hours. HbA1C: No results for input(s): HGBA1C in the last 72 hours. CBG: Recent Labs  Lab 12/09/18 1904  GLUCAP 89   Lipid Profile: No results for input(s): CHOL, HDL, LDLCALC, TRIG, CHOLHDL, LDLDIRECT in the last 72 hours. Thyroid Function Tests: No results for input(s): TSH, T4TOTAL, FREET4, T3FREE, THYROIDAB in the last 72 hours. Anemia Panel: No results for input(s): VITAMINB12, FOLATE, FERRITIN, TIBC, IRON, RETICCTPCT in the last 72 hours. Sepsis Labs: Recent Labs  Lab 12/09/18 1937 12/09/18 2200  LATICACIDVEN 1.2 0.8    Recent Results (from the past 240 hour(s))  Blood Culture (routine x 2)     Status: None   Collection Time: 12/09/18  7:37 PM    Specimen: BLOOD  Result Value Ref Range Status   Specimen Description   Final    BLOOD LEFT ANTECUBITAL Performed at [facility] [address], [place], [place] [address]    Special Requests   Final    BOTTLES DRAWN AEROBIC AND ANAEROBIC Blood Culture adequate volume Performed at [facility] [address], [place], [place] [address]    Culture   Final  NO GROWTH 5 DAYS Performed at [facility], [place] [address], [place],  [place] [address]    Report Status 12/14/2018 FINAL  Final  Urine culture     Status: Abnormal   Collection Time: 12/09/18  7:37 PM   Specimen: In/Out Cath Urine  Result Value Ref Range Status   Specimen Description   Final    IN/OUT CATH URINE Performed at [facility] [address] [place], [place] [address]    Special Requests   Final    NONE Performed at [facility], [place] [address], [place], [place] [address]    Culture >=100,000 COLONIES/mL ESCHERICHIA COLI (A)  Final   Report Status 12/12/2018 FINAL  Final   Organism ID, Bacteria ESCHERICHIA COLI (A)  Final      Susceptibility   Escherichia coli - MIC*    AMPICILLIN <=2 SENSITIVE Sensitive     CEFAZOLIN <=4 SENSITIVE Sensitive     CEFTRIAXONE <=1 SENSITIVE Sensitive     CIPROFLOXACIN <=0.25 SENSITIVE Sensitive     GENTAMICIN <=1 SENSITIVE Sensitive     IMIPENEM <=0.25 SENSITIVE Sensitive     NITROFURANTOIN <=16 SENSITIVE Sensitive     TRIMETH/SULFA <=20 SENSITIVE Sensitive     AMPICILLIN/SULBACTAM <=2 SENSITIVE Sensitive     PIP/TAZO <=4 SENSITIVE Sensitive     Extended ESBL NEGATIVE Sensitive     * >=100,000 COLONIES/mL ESCHERICHIA COLI  SARS CORONAVIRUS 2 (TAT 6-24 HRS) Nasopharyngeal Nasopharyngeal Swab     Status: None   Collection Time: 12/09/18  7:37 PM   Specimen: Nasopharyngeal Swab  Result Value Ref Range Status   SARS Coronavirus 2 NEGATIVE NEGATIVE Final    Comment: (NOTE) SARS-CoV-2 target  nucleic acids are NOT DETECTED. The SARS-CoV-2 RNA is generally detectable in upper and lower respiratory specimens during the acute phase of infection. Negative results do not preclude SARS-CoV-2 infection, do not rule out co-infections with other pathogens, and should not be used as the sole basis for treatment or other patient management decisions. Negative results must be combined with clinical observations, patient history, and epidemiological information. The expected result is Negative. Fact Sheet for Patients: SugarRoll.be Fact Sheet for Healthcare Providers: https://www.woods-mathews.com/ This test is not yet approved or cleared by the [place] FDA and  has been authorized for detection and/or diagnosis of SARS-CoV-2 by FDA under an Emergency Use Authorization (EUA). This EUA will remain  in effect (meaning this test can be used) for the duration of the COVID-19 declaration under Section 56 4(b)(1) of the Act, 21 U.S.C. section 360bbb-3(b)(1), unless the authorization is terminated or revoked sooner. Performed at [facility], [place] [address] [place], [place] [address]   C difficile quick scan w PCR reflex     Status: None   Collection Time: 12/10/18 11:08 AM   Specimen: STOOL  Result Value Ref Range Status   C Diff antigen NEGATIVE NEGATIVE Final   C Diff toxin NEGATIVE NEGATIVE Final   C Diff interpretation No C. difficile detected.  Final    Comment: Performed at [facility], [place] [address], [place], [place] [address]    Radiology Studies: No results found.   Scheduled Meds: . allopurinol  100 mg Oral BID  . apixaban  5 mg Oral BID  . carvedilol  6.25 mg Oral BID WC  . cholecalciferol  2,000 Units Oral Daily  . gabapentin  300 mg Oral BID  . Muscle Rub   Topical Daily  . nystatin-triamcinolone  1 application Topical BID  . pantoprazole  40 mg Oral Daily  . predniSONE  40 mg Oral QAC  breakfast  . primidone  150 mg Oral Daily   And  . primidone  100 mg Oral QHS  . tamsulosin  0.4 mg Oral Daily   Continuous Infusions: . sodium chloride Stopped (12/12/18 0927)  .  ceFAZolin (ANCEF) IV 1 g (12/14/18 1828)     LOS: 5 days    Time spent: 70 minutes with over 50% time coordinating patient's care and communicating with family.   Harold Hedge, DO [facility] Pager: [phone]   If 7PM-7AM, please contact night-coverage

## 2018-12-14 NOTE — Plan of Care (Signed)
  Problem: Nutrition: Goal: Adequate nutrition will be maintained Outcome: Progressing   Problem: Elimination: Goal: Will not experience complications related to bowel motility Outcome: Progressing   Problem: Pain Managment: Goal: General experience of comfort will improve Outcome: Progressing   

## 2018-12-15 LAB — CBC
HCT: 27.4 % — ABNORMAL LOW (ref 36.0–46.0)
Hemoglobin: 8.6 g/dL — ABNORMAL LOW (ref 12.0–15.0)
MCH: 30.7 pg (ref 26.0–34.0)
MCHC: 31.4 g/dL (ref 30.0–36.0)
MCV: 97.9 fL (ref 80.0–100.0)
Platelets: 260 K/uL (ref 150–400)
RBC: 2.8 MIL/uL — ABNORMAL LOW (ref 3.87–5.11)
RDW: 14.8 % (ref 11.5–15.5)
WBC: 9 K/uL (ref 4.0–10.5)
nRBC: 0.3 % — ABNORMAL HIGH (ref 0.0–0.2)

## 2018-12-15 LAB — BASIC METABOLIC PANEL
Anion gap: 8 (ref 5–15)
BUN: 9 mg/dL (ref 8–23)
CO2: 25 mmol/L (ref 22–32)
Calcium: 8.5 mg/dL — ABNORMAL LOW (ref 8.9–10.3)
Chloride: 102 mmol/L (ref 98–111)
Creatinine, Ser: 0.6 mg/dL (ref 0.44–1.00)
GFR calc Af Amer: >60 mL/min (ref >60)
GFR calc non Af Amer: >60 mL/min (ref >60)
Glucose, Bld: 78 mg/dL (ref 70–99)
Potassium: 3.4 mmol/L — ABNORMAL LOW (ref 3.5–5.1)
Sodium: 135 mmol/L (ref 135–145)

## 2018-12-15 LAB — GASTROINTESTINAL PANEL BY PCR, STOOL (REPLACES STOOL CULTURE)

## 2018-12-15 LAB — MAGNESIUM: Magnesium: 1.5 mg/dL — ABNORMAL LOW (ref 1.7–2.4)

## 2018-12-15 MED ORDER — METRONIDAZOLE 500 MG PO TABS
500.0000 mg | ORAL_TABLET | Freq: Two times a day (BID) | ORAL | Status: DC
  Administered 2018-12-15: 500 mg via ORAL
  Filled 2018-12-15: qty 1

## 2018-12-15 MED ORDER — POTASSIUM CHLORIDE CRYS ER 20 MEQ PO TBCR
20.0000 meq | EXTENDED_RELEASE_TABLET | Freq: Once | ORAL | Status: AC
  Administered 2018-12-15: 20 meq via ORAL
  Filled 2018-12-15: qty 1

## 2018-12-15 MED ORDER — APIXABAN 5 MG PO TABS: 5.0000 mg | ORAL_TABLET | Freq: Two times a day (BID) | ORAL | 1 refills | Status: AC

## 2018-12-15 MED ORDER — METRONIDAZOLE 500 MG PO TABS: 500.0000 mg | ORAL_TABLET | Freq: Two times a day (BID) | ORAL | 0 refills | Status: AC

## 2018-12-15 NOTE — Care Management Important Message (Signed)
Important Message  Patient Details IM Letter given to [facility] SW to present to the Patient Name: Caitlin Chavez MRN: [id number] Date of Birth: Dec 03, 1934   Medicare Important Message Given:  Yes     Kerin Salen 12/15/2018, 12:27 PM

## 2018-12-15 NOTE — Discharge Summary (Signed)
Physician Discharge Summary  [name] [id number] DOB: 03-08-1934 DOA: 12/09/2018  PCP: Sueanne Margarita, DO  Admit date: 12/09/2018 Discharge date: 12/15/2018   Code Status: Prior  Admitted From: Home Discharged to: [place]: PT/OT Equipment/Devices: None Discharge Condition: Stable  Recommendations for Outpatient Follow-up   1. Follow up with PCP in 1 week 2. Please follow up BMP/CBC  3. Metronidazole 500 mg twice daily x5 days for Giardia 4. Follow-up on diarrhea 5. Dementia eval  Hospital Summary  Caitlin Chavez a 84 y.o.femalewith medical history significant ofanxiety, recent Giardia infection with inadequate treatment, osteoarthritis of the hands, carpal tunnel syndrome, colon polyps, diverticulosis, GERD, hiatal hernia, hypertension, macular degeneration, peripheral neuropathy who was recently admitted for FUO on 10/29/2018, discharged to a rehab facility, returning home on on 12/06/2018 nowpresentedto the ED due to fever, weakness, confusion and diarrhea.Person she was well for the first day and a half, then started having significant urinary urgency. She hadsome loose stool episodes. ED Course:Her initial vital signs temperature 101.6 F, pulse 105, respirations 16, blood pressure 134/66 mmHg and O2 sat 97% on room air. The patient received a 1000 mL NS bolus.UA showed evidence of urinary tract infection.   Urine culture positive for E coli infection she completed 5 days antibiotics  Also continue to have soft/loose stools during hospitalization.  Previously diagnosed with Giardia and inadequately treated.  Discharged on Flagyl x5 days  A & P   Principal Problem:   Sepsis secondary to UTI [facility]) Active Problems:   Essential tremor   Hyponatremia   Essential hypertension   Anxiety   History of gout   GERD (gastroesophageal reflux disease)   Peripheral neuropathy   Anemia   Atrial fibrillation with RVR (HCC)   Hypomagnesemia  # Sepsis  secondary to E coli UTI  completed 5/5 days antibiotics sepsis resolved. Hemodynamically stable on room air, Awake and alert answering questions appropriately  #New onset atrial fibrillation with RVR ([place]) CHADS-VASc Scoreof at least 5  HR controlled. On carvedilol -Continue apixaban and  carvedilol  Persistent diarrhea/loose stools suspect secondary to recent Giardia infection which was inadequately treated, secondary to persistent Giardia infection Culture and treatment poorly documented and only noted in stool culture from 11/04/2018.  Discharge summary from 10/8 noted persistent diarrhea and GI pathogen PCR sent at that time and diarrhea thought secondary to colchicine and patient was given Imodium at that time. I do not see any follow up notes addressing giardia or treatment since that time. Seems to have only received 5 doses of metronidazole (2.5 days) this hospitalization which was actually directed towards sepsis for UTI and not Giardia.  Patient still having loose stools per son and bedside nurse, but patient denies this (son reports she will say what she needs to in order to be discharged)  C. difficile negative 11/11. Repeat stool cultures positive for Giardia on 11/15 -Started on Flagyl 500 mg twice daily on 11/16 and continued at discharge  # Hypomagnesemia/ Hypokalemia Resolved  Suspected underlying dementia -outpatient follow up  # Essential tremor Continue primidone.  #Chronic hyponatremia  Resolved -Hold hydrochlorothiazide.  Essential hypertension -Hold benazepril and HCTZ. -Continue carvedilol 6.25 mg p.o. twice daily. -Monitor BP, HR, renal function,electrolytes.  Anxiety Continue alprazolam twice daily as needed.  History of gout Stable. Was discharged on 7 day steroid dose on 10/8.  Started on prednisone 40 mg daily as inpatient, she does not appear to be in gout flare. -Continue allopurinol 100 mg p.o. twice daily -Discontinue  prednisone  40 mg   GERD (gastroesophageal reflux disease) Continue PPI.  Peripheral neuropathy Continue gabapentin.  Anemia of chronic disease Hemoglobin 8.9 today,Denies any GI bleed. Iron studies indicate AOCD FOBT negative, continue to monitor CBC  Consultants  . None  Procedures  . None   Subjective  Patient seen and examined at bedside no acute distress and resting comfortably.  No events overnight.  Tolerating diet. In good spirits and anticipating discharge.   Long discussion with son at bedside today who was very appreciative of the care his mother received prior to discharge and agrees to have mother further evaluated with her PCP he is concerned that she has dementia.  Denies any chest pain, shortness of breath, fever, nausea, vomiting, urinary or bowel complaints. Otherwise ROS negative   Objective   Discharge Exam: Vitals:   12/15/18 0606 12/15/18 1455  BP: 123/77 138/65  Pulse: 76 83  Resp: 18 18  Temp: 98.6 F (37 C) 97.7 F (36.5 C)  SpO2: 97% 100%   Vitals:   12/14/18 1409 12/14/18 2033 12/15/18 0606 12/15/18 1455  BP: 132/68 134/68 123/77 138/65  Pulse: 75 64 76 83  Resp: 16 17 18 18   Temp: 98.4 F (36.9 C) 97.9 F (36.6 C) 98.6 F (37 C) 97.7 F (36.5 C)  TempSrc: Oral Oral Oral Oral  SpO2: 99% 98% 97% 100%  Weight:      Height:        Physical Exam Vitals signs and nursing note reviewed.  Constitutional:      Appearance: Normal appearance.  HENT:     Head: Normocephalic and atraumatic.     Nose: Nose normal.     Mouth/Throat:     Mouth: Mucous membranes are moist.  Eyes:     Extraocular Movements: Extraocular movements intact.  Neck:     Musculoskeletal: Normal range of motion. No neck rigidity.  Cardiovascular:     Rate and Rhythm: Normal rate and regular rhythm.  Pulmonary:     Effort: Pulmonary effort is normal.     Breath sounds: Normal breath sounds.  Abdominal:     General: Abdomen is flat.     Palpations: Abdomen is  soft.  Musculoskeletal: Normal range of motion.        General: No swelling.  Neurological:     General: No focal deficit present.     Mental Status: She is alert and oriented to person, place, and time. Mental status is at baseline.  Psychiatric:        Mood and Affect: Mood normal.        Behavior: Behavior normal.       The results of significant diagnostics from this hospitalization (including imaging, microbiology, ancillary and laboratory) are listed below for reference.     Microbiology: Recent Results (from the past 240 hour(s))  Blood Culture (routine x 2)     Status: None   Collection Time: 12/09/18  7:37 PM   Specimen: BLOOD  Result Value Ref Range Status   Specimen Description   Final    BLOOD LEFT ANTECUBITAL Performed at [facility] [address] [place], [place] [address]    Special Requests   Final    BOTTLES DRAWN AEROBIC AND ANAEROBIC Blood Culture adequate volume Performed at [facility] [address], [place], [place] [address]    Culture   Final    NO GROWTH 5 DAYS Performed at [facility], [place],  [place] [address]    Report Status 12/14/2018 FINAL  Final  Urine culture     Status: Abnormal   Collection Time: 12/09/18  7:37 PM   Specimen: In/Out Cath Urine  Result Value Ref Range Status   Specimen Description   Final    IN/OUT CATH URINE Performed at [facility] [address], [place], [place] [address]    Special Requests   Final    NONE Performed at [facility], [place] [address], [place], [place] [address]    Culture >=100,000 COLONIES/mL ESCHERICHIA COLI (A)  Final   Report Status 12/12/2018 FINAL  Final   Organism ID, Bacteria ESCHERICHIA COLI (A)  Final      Susceptibility   Escherichia coli - MIC*    AMPICILLIN <=2 SENSITIVE Sensitive     CEFAZOLIN <=4 SENSITIVE Sensitive     CEFTRIAXONE <=1 SENSITIVE Sensitive      CIPROFLOXACIN <=0.25 SENSITIVE Sensitive     GENTAMICIN <=1 SENSITIVE Sensitive     IMIPENEM <=0.25 SENSITIVE Sensitive     NITROFURANTOIN <=16 SENSITIVE Sensitive     TRIMETH/SULFA <=20 SENSITIVE Sensitive     AMPICILLIN/SULBACTAM <=2 SENSITIVE Sensitive     PIP/TAZO <=4 SENSITIVE Sensitive     Extended ESBL NEGATIVE Sensitive     * >=100,000 COLONIES/mL ESCHERICHIA COLI  SARS CORONAVIRUS 2 (TAT 6-24 HRS) Nasopharyngeal Nasopharyngeal Swab     Status: None   Collection Time: 12/09/18  7:37 PM   Specimen: Nasopharyngeal Swab  Result Value Ref Range Status   SARS Coronavirus 2 NEGATIVE NEGATIVE Final    Comment: (NOTE) SARS-CoV-2 target nucleic acids are NOT DETECTED. The SARS-CoV-2 RNA is generally detectable in upper and lower respiratory specimens during the acute phase of infection. Negative results do not preclude SARS-CoV-2 infection, do not rule out co-infections with other pathogens, and should not be used as the sole basis for treatment or other patient management decisions. Negative results must be combined with clinical observations, patient history, and epidemiological information. The expected result is Negative. Fact Sheet for Patients: SugarRoll.be Fact Sheet for Healthcare Providers: https://www.woods-mathews.com/ This test is not yet approved or cleared by the [place] FDA and  has been authorized for detection and/or diagnosis of SARS-CoV-2 by FDA under an Emergency Use Authorization (EUA). This EUA will remain  in effect (meaning this test can be used) for the duration of the COVID-19 declaration under Section 56 4(b)(1) of the Act, 21 U.S.C. section 360bbb-3(b)(1), unless the authorization is terminated or revoked sooner. Performed at [facility], [place] [address], [place], [place] [address]   C difficile quick scan w PCR reflex     Status: None   Collection Time: 12/10/18 11:08 AM   Specimen: STOOL  Result  Value Ref Range Status   C Diff antigen NEGATIVE NEGATIVE Final   C Diff toxin NEGATIVE NEGATIVE Final   C Diff interpretation No C. difficile detected.  Final    Comment: Performed at [facility], [place] [address] [place], [place] [address]  Gastrointestinal Panel by PCR , Stool     Status: Abnormal   Collection Time: 12/15/18  7:33 AM   Specimen: Anus; Stool  Result Value Ref Range Status   Campylobacter species NOT DETECTED NOT DETECTED Final   Plesimonas shigelloides NOT DETECTED NOT DETECTED Final   Salmonella species NOT DETECTED NOT DETECTED Final   Yersinia enterocolitica NOT DETECTED NOT DETECTED Final   Vibrio species NOT DETECTED NOT DETECTED Final  Vibrio cholerae NOT DETECTED NOT DETECTED Final   Enteroaggregative E coli (EAEC) NOT DETECTED NOT DETECTED Final   Enteropathogenic E coli (EPEC) NOT DETECTED NOT DETECTED Final   Enterotoxigenic E coli (ETEC) NOT DETECTED NOT DETECTED Final   Shiga like toxin producing E coli (STEC) NOT DETECTED NOT DETECTED Final   Shigella/Enteroinvasive E coli (EIEC) NOT DETECTED NOT DETECTED Final   Cryptosporidium NOT DETECTED NOT DETECTED Final   Cyclospora cayetanensis NOT DETECTED NOT DETECTED Final   Entamoeba histolytica NOT DETECTED NOT DETECTED Final   Giardia lamblia DETECTED (A) NOT DETECTED Final   Adenovirus F40/41 NOT DETECTED NOT DETECTED Final   Astrovirus NOT DETECTED NOT DETECTED Final   Norovirus GI/GII NOT DETECTED NOT DETECTED Final   Rotavirus A NOT DETECTED NOT DETECTED Final   Sapovirus (I, II, IV, and V) NOT DETECTED NOT DETECTED Final    Comment: Performed at [facility], [address] [place], [place] [address]     Labs: BNP (last 3 results) No results for input(s): BNP in the last 8760 hours. Basic Metabolic Panel: Recent Labs  Lab 12/09/18 2310  12/11/18 0528 12/12/18 0656 12/13/18 0606 12/14/18 0829 12/15/18 0500  NA  --    < > 130* 131* 133* 132* 135  K  --    < >  4.0 3.8 3.7 3.6 3.4*  CL  --    < > 101 101 99 99 102  CO2  --    < > 20* 21* 24 23 25   GLUCOSE  --    < > 87 83 84 85 78  BUN  --    < > 12 12 12 12 9   CREATININE  --    < > 0.59 0.55 0.69 0.59 0.60  CALCIUM  --    < > 8.0* 8.4* 8.7* 8.5* 8.5*  MG 1.3*  --  1.9  --  1.7  --  1.5*  PHOS 3.3  --   --   --   --   --   --    < > = values in this interval not displayed.   Liver Function Tests: Recent Labs  Lab 12/09/18 1937 12/10/18 0942 12/13/18 0606  AST 26 16 40  ALT 30 21 52*  ALKPHOS 103 81 80  BILITOT 0.3 1.0 0.5  PROT 6.0* 5.3* 5.5*  ALBUMIN 2.6* 2.3* 2.3*   No results for input(s): LIPASE, AMYLASE in the last 168 hours. No results for input(s): AMMONIA in the last 168 hours. CBC: Recent Labs  Lab 12/09/18 1937 12/10/18 0942 12/11/18 0528 12/11/18 1356 12/12/18 0656 12/13/18 0606 12/14/18 0829 12/15/18 0500  WBC 13.4* 11.8* 9.0  --  8.7 10.0 9.1 9.0  NEUTROABS 10.6* 10.0*  --   --   --   --   --   --   HGB 9.6* 9.0* 7.7* 10.2* 8.4* 8.9* 7.9* 8.6*  HCT 29.7* 28.3* 24.2* 33.9* 26.4* 28.0* 25.2* 27.4*  MCV 95.8 98.3 97.2  --  98.1 96.6 97.7 97.9  PLT 258 226 213  --  225 234 225 260   Cardiac Enzymes: No results for input(s): CKTOTAL, CKMB, CKMBINDEX, TROPONINI in the last 168 hours. BNP: Invalid input(s): POCBNP CBG: Recent Labs  Lab 12/09/18 1904  GLUCAP 89   D-Dimer No results for input(s): DDIMER in the last 72 hours. Hgb A1c No results for input(s): HGBA1C in the last 72 hours. Lipid Profile No results for input(s): CHOL, HDL, LDLCALC, TRIG, CHOLHDL, LDLDIRECT in the  last 72 hours. Thyroid function studies No results for input(s): TSH, T4TOTAL, T3FREE, THYROIDAB in the last 72 hours.  Invalid input(s): FREET3 Anemia work up No results for input(s): VITAMINB12, FOLATE, FERRITIN, TIBC, IRON, RETICCTPCT in the last 72 hours. Urinalysis    Component Value Date/Time   COLORURINE YELLOW 12/09/2018 1937   APPEARANCEUR CLOUDY (A) 12/09/2018 1937    LABSPEC 1.012 12/09/2018 1937   PHURINE 5.0 12/09/2018 1937   GLUCOSEU NEGATIVE 12/09/2018 1937   HGBUR SMALL (A) 12/09/2018 [place] NEGATIVE 12/09/2018 [place] NEGATIVE 12/09/2018 1937   PROTEINUR 30 (A) 12/09/2018 1937   NITRITE POSITIVE (A) 12/09/2018 1937   LEUKOCYTESUR LARGE (A) 12/09/2018 1937   Sepsis Labs Invalid input(s): PROCALCITONIN,  WBC,  LACTICIDVEN Microbiology Recent Results (from the past 240 hour(s))  Blood Culture (routine x 2)     Status: None   Collection Time: 12/09/18  7:37 PM   Specimen: BLOOD  Result Value Ref Range Status   Specimen Description   Final    BLOOD LEFT ANTECUBITAL Performed at [facility], [place] [address], [place], [place] [address]    Special Requests   Final    BOTTLES DRAWN AEROBIC AND ANAEROBIC Blood Culture adequate volume Performed at [facility] [address], [place], [place] [address]    Culture   Final    NO GROWTH 5 DAYS Performed at [facility], [place] [address], [place], [place] [address]    Report Status 12/14/2018 FINAL  Final  Urine culture     Status: Abnormal   Collection Time: 12/09/18  7:37 PM   Specimen: In/Out Cath Urine  Result Value Ref Range Status   Specimen Description   Final    IN/OUT CATH URINE Performed at [facility] [address] [place], [place] [address]    Special Requests   Final    NONE Performed at [facility], [place] [address], [place], [place] [address]    Culture >=100,000 COLONIES/mL ESCHERICHIA COLI (A)  Final   Report Status 12/12/2018 FINAL  Final   Organism ID, Bacteria ESCHERICHIA COLI (A)  Final      Susceptibility   Escherichia coli - MIC*    AMPICILLIN <=2 SENSITIVE Sensitive     CEFAZOLIN <=4 SENSITIVE Sensitive     CEFTRIAXONE <=1 SENSITIVE Sensitive     CIPROFLOXACIN <=0.25 SENSITIVE Sensitive     GENTAMICIN <=1 SENSITIVE Sensitive     IMIPENEM <=0.25 SENSITIVE  Sensitive     NITROFURANTOIN <=16 SENSITIVE Sensitive     TRIMETH/SULFA <=20 SENSITIVE Sensitive     AMPICILLIN/SULBACTAM <=2 SENSITIVE Sensitive     PIP/TAZO <=4 SENSITIVE Sensitive     Extended ESBL NEGATIVE Sensitive     * >=100,000 COLONIES/mL ESCHERICHIA COLI  SARS CORONAVIRUS 2 (TAT 6-24 HRS) Nasopharyngeal Nasopharyngeal Swab     Status: None   Collection Time: 12/09/18  7:37 PM   Specimen: Nasopharyngeal Swab  Result Value Ref Range Status   SARS Coronavirus 2 NEGATIVE NEGATIVE Final    Comment: (NOTE) SARS-CoV-2 target nucleic acids are NOT DETECTED. The SARS-CoV-2 RNA is generally detectable in upper and lower respiratory specimens during the acute phase of infection. Negative results do not preclude SARS-CoV-2 infection, do not rule out co-infections with other pathogens, and should not be used as the sole basis for treatment or other patient management decisions. Negative results must be combined with clinical observations, patient history, and epidemiological information. The  expected result is Negative. Fact Sheet for Patients: SugarRoll.be Fact Sheet for Healthcare Providers: https://www.woods-mathews.com/ This test is not yet approved or cleared by the [place] FDA and  has been authorized for detection and/or diagnosis of SARS-CoV-2 by FDA under an Emergency Use Authorization (EUA). This EUA will remain  in effect (meaning this test can be used) for the duration of the COVID-19 declaration under Section 56 4(b)(1) of the Act, 21 U.S.C. section 360bbb-3(b)(1), unless the authorization is terminated or revoked sooner. Performed at [facility], [place] [address], [place], [place] [address]   C difficile quick scan w PCR reflex     Status: None   Collection Time: 12/10/18 11:08 AM   Specimen: STOOL  Result Value Ref Range Status   C Diff antigen NEGATIVE NEGATIVE Final   C Diff toxin NEGATIVE NEGATIVE Final   C  Diff interpretation No C. difficile detected.  Final    Comment: Performed at [facility], [place] [address] [place], [place] [address]  Gastrointestinal Panel by PCR , Stool     Status: Abnormal   Collection Time: 12/15/18  7:33 AM   Specimen: Anus; Stool  Result Value Ref Range Status   Campylobacter species NOT DETECTED NOT DETECTED Final   Plesimonas shigelloides NOT DETECTED NOT DETECTED Final   Salmonella species NOT DETECTED NOT DETECTED Final   Yersinia enterocolitica NOT DETECTED NOT DETECTED Final   Vibrio species NOT DETECTED NOT DETECTED Final   Vibrio cholerae NOT DETECTED NOT DETECTED Final   Enteroaggregative E coli (EAEC) NOT DETECTED NOT DETECTED Final   Enteropathogenic E coli (EPEC) NOT DETECTED NOT DETECTED Final   Enterotoxigenic E coli (ETEC) NOT DETECTED NOT DETECTED Final   Shiga like toxin producing E coli (STEC) NOT DETECTED NOT DETECTED Final   Shigella/Enteroinvasive E coli (EIEC) NOT DETECTED NOT DETECTED Final   Cryptosporidium NOT DETECTED NOT DETECTED Final   Cyclospora cayetanensis NOT DETECTED NOT DETECTED Final   Entamoeba histolytica NOT DETECTED NOT DETECTED Final   Giardia lamblia DETECTED (A) NOT DETECTED Final   Adenovirus F40/41 NOT DETECTED NOT DETECTED Final   Astrovirus NOT DETECTED NOT DETECTED Final   Norovirus GI/GII NOT DETECTED NOT DETECTED Final   Rotavirus A NOT DETECTED NOT DETECTED Final   Sapovirus (I, II, IV, and V) NOT DETECTED NOT DETECTED Final    Comment: Performed at [facility], [address], [place], [place] [address]    Discharge Instructions     Discharge Instructions    Diet - low sodium heart healthy   Complete by: As directed    Discharge instructions   Complete by: As directed    You were seen and examined in the hospital for E. coli urinary tract infection and sepsis and found to have atrial fibrillation which is an irregular heart rhythm as well as Giardia on your stool sample.   You had improvement in your symptoms and discharged in stable condition  Upon Discharge:  -Take metronidazole 500 mg twice daily.  You received your first dose this afternoon.  Take an additional dose this evening -Take Eliquis 5 mg by mouth twice daily which is a blood thinner to prevent stroke while in atrial fibrillation.  While on this medication you need to make sure you are very careful while you are walking in view mindful not to fall.  Monitor for any blood in your stool. -Stop taking Lotensin -Stop taking Imodium -Stop taking prednisone -Take carvedilol as prescribed Make an  appointment with your primary care physician within 7 days Get lab work prior to your follow up appointment with your PCP Bring all home medications to your appointment to review Request that your primary physician go over all hospital tests and procedures/radiological results at the follow up.   Please get all hospital records sent to your physician by signing a hospital release before you go home.     Read the complete instructions along with all the possible side effects for all the medicines you take and that have been prescribed to you. Take any new medicines after you have completely understood and accept all the possible adverse reactions/side effects.   If you have any questions about your discharge medications or the care you received while you were in the hospital, you can call the unit and asked to speak with the hospitalist on call. Once you are discharged, your primary care physician will handle any further medical issues. Please note that NO REFILLS for any discharge medications will be authorized, as it is imperative that you return to your primary care physician (or establish a relationship with a primary care physician if you do not have one) for your aftercare needs so that they can reassess your need for medications and monitor your lab values.   Do not drive, operate heavy machinery, perform  activities at heights, swimming or participation in water activities or provide baby sitting services if your were admitted for loss of consciousness/seizures or if you are on sedating medications including, but not limited to benzodiazepines, sleep medications, narcotic pain medications, etc., until you have been cleared to do so by a medical doctor.   Do not take more than prescribed medications.   Wear a seat belt while driving.  If you have smoked or chewed Tobacco in the last 2 years please stop smoking; also stop any regular Alcohol and/or any Recreational drug use including marijuana.  If you experience worsening of your admission symptoms or develop shortness of breath, chest pain, suicidal or homicidal thoughts or experience a life threatening emergency, you must seek medical attention immediately by calling 911 or calling your PCP immediately.   Increase activity slowly   Complete by: As directed      Allergies as of 12/15/2018      Reactions   Alka-seltzer [aspirin Effervescent] Anaphylaxis, Hives   Beef-derived Products Anaphylaxis   Nsaids Anaphylaxis, Hives   Other Anaphylaxis, Rash   Pork-derived Products Anaphylaxis   Codeine Hives, Nausea And Vomiting      Medication List    STOP taking these medications   benazepril-hydrochlorthiazide 20-25 MG tablet Commonly known as: LOTENSIN HCT   loperamide 2 MG capsule Commonly known as: IMODIUM   predniSONE 20 MG tablet Commonly known as: DELTASONE     TAKE these medications   allopurinol 100 MG tablet Commonly known as: ZYLOPRIM Take 100 mg by mouth 2 (two) times daily.   ALPRAZolam 0.5 MG tablet Commonly known as: XANAX Take 0.5 mg by mouth 2 (two) times daily as needed for anxiety.   apixaban 5 MG Tabs tablet Commonly known as: ELIQUIS Take 1 tablet (5 mg total) by mouth 2 (two) times daily.   Biofreeze 4 % Gel Generic drug: Menthol (Topical Analgesic) Apply 1 application topically daily. Both knees    CALCIUM 600+D PO Take 1 tablet by mouth daily.   carvedilol 6.25 MG tablet Commonly known as: COREG Take 6.25 mg by mouth 2 (two) times daily with a meal.   cholecalciferol 25  MCG (1000 UT) tablet Commonly known as: VITAMIN D Take 2,000 Units by mouth daily.   EPINEPHrine 0.3 mg/0.3 mL Soaj injection Commonly known as: EPI-PEN Inject 0.3 mLs (0.3 mg total) into the skin once as needed (for allergic reaction).   feeding supplement (ENSURE ENLIVE) Liqd Take 237 mLs by mouth 2 (two) times daily between meals.   gabapentin 300 MG capsule Commonly known as: NEURONTIN Take 300 mg by mouth 2 (two) times daily.   metroNIDAZOLE 500 MG tablet Commonly known as: FLAGYL Take 1 tablet (500 mg total) by mouth 2 (two) times daily for 5 days.   nystatin-triamcinolone cream Commonly known as: MYCOLOG II Apply 1 application topically 2 (two) times daily.   OCUVITE PO Take 1 tablet by mouth daily.   pantoprazole 40 MG tablet Commonly known as: PROTONIX Take 1 tablet (40 mg total) by mouth daily.   primidone 50 MG tablet Commonly known as: MYSOLINE 3 in AM, 2 in PM What changed:   how much to take  how to take this  when to take this   tamsulosin 0.4 MG Caps capsule Commonly known as: FLOMAX Take 1 capsule (0.4 mg total) by mouth daily.   vitamin B-12 100 MCG tablet Commonly known as: CYANOCOBALAMIN Take 100 mcg by mouth daily.      Follow-up Information    Home, [facility] Follow up.   Specialty: Home Health Services Why: Will follow up with you for OT, PT. Contact information: [address] [place] [address] [phone]          Allergies  Allergen Reactions  . Alka-Seltzer [Aspirin Effervescent] Anaphylaxis and Hives  . Beef-Derived Products Anaphylaxis  . Nsaids Anaphylaxis and Hives  . Other Anaphylaxis and Rash  . Pork-Derived Products Anaphylaxis  . Codeine Hives and Nausea And Vomiting    Time coordinating discharge: Over 30 minutes    SIGNED:   Harold Hedge, D.O. [facility] Pager: [phone]  12/15/2018, 7:28 PM

## 2018-12-15 NOTE — TOC Transition Note (Signed)
Transition of Care [facility]) - CM/SW Discharge Note   Patient Details  Name: Caitlin Chavez MRN: [id number] Date of Birth: [birth year]  [facility] CM/SW Contact:  Trish Mage, LCSW Phone Number: 12/15/2018, 2:23 PM   Clinical Narrative:   Patient to d/c today.  [facility] alerted as to d/c today.  Son is in room with patient and confirms he will transport home.  TOC sign off.    Final next level of care: Home w Home Health Services Barriers to Discharge: No Barriers Identified   Patient Goals and CMS Choice Patient states their goals for this hospitalization and ongoing recovery are:: "Could you please call my son?  I think he knows better than I do." CMS Medicare.gov Compare Post Acute Care list provided to:: Patient Choice offered to / list presented to : Patient, Adult Children  Discharge Placement                       Discharge Plan and Services   Discharge Planning Services: CM Consult                                 Social Determinants of Health (SDOH) Interventions     Readmission Risk Interventions No flowsheet data found.

## 2018-12-15 NOTE — Final Progress Note (Signed)
Patient given discharge instructions and verbalizes understanding. Patient with no complaints at the current time. Son at bedside.

## 2018-12-15 NOTE — Progress Notes (Signed)
Physical Therapy Treatment Patient Details Name: Caitlin Chavez MRN: [id number] DOB: March 31, 1934 Today's Date: 12/15/2018    History of Present Illness Caitlin Chavez is a 84 y.o. female with medical history significant of anxiety, osteoarthritis of the hands, carpal tunnel syndrome, colon polyps, diverticulosis, GERD, hiatal hernia, hypertension, macular degeneration, peripheral neuropathy who was at rehab for recent hospitalization and then returned home on 12/06/2018. She came to the ED due to fever, weakness, confusion and diarrhea.    PT Comments    Son present today during session. Pt has all DME needed and was just starting to see [facility] when pt was admitted to the hospital.  She continues to need the most assistance with sit > stand and son is aware.  He has a gait belt at home.  Recommend HHPT (Son requesting [facility]).    Follow Up Recommendations  Home health PT;Supervision/Assistance - 24 hour     Equipment Recommendations  None recommended by PT    Recommendations for Other Services       Precautions / Restrictions Precautions Precautions: Fall Restrictions Weight Bearing Restrictions: No    Mobility  Bed Mobility Overal bed mobility: Needs Assistance Bed Mobility: Supine to Sit     Supine to sit: Min guard     General bed mobility comments: bed flat, but heavy use of rail  Transfers Overall transfer level: Needs assistance Equipment used: Rolling walker (2 wheeled) Transfers: Sit to/from Stand Sit to Stand: Mod assist         General transfer comment: MOD A to power up  Ambulation/Gait Ambulation/Gait assistance: Min guard Gait Distance (Feet): 90 Feet Assistive device: Rolling walker (2 wheeled) Gait Pattern/deviations: Trunk flexed;Decreased step length - right;Decreased step length - left Gait velocity: decreased   General Gait Details: Cues to stay within RW and for upright posture   Stairs             Wheelchair Mobility     Modified Rankin (Stroke Patients Only)       Balance Overall balance assessment: Needs assistance Sitting-balance support: Feet supported Sitting balance-Leahy Scale: Fair     Standing balance support: Bilateral upper extremity supported Standing balance-Leahy Scale: Poor Standing balance comment: requires UE support                            Cognition Arousal/Alertness: Awake/alert Behavior During Therapy: WFL for tasks assessed/performed Overall Cognitive Status: Within Functional Limits for tasks assessed                                        Exercises      General Comments        Pertinent Vitals/Pain      Home Living                      Prior Function            PT Goals (current goals can now be found in the care plan section) Acute Rehab PT Goals Patient Stated Goal: go home Progress towards PT goals: Progressing toward goals    Frequency    Min 3X/week      PT Plan Current plan remains appropriate;Equipment recommendations need to be updated    Co-evaluation              AM-PAC PT "6  Clicks" Mobility   Outcome Measure  Help needed turning from your back to your side while in a flat bed without using bedrails?: A Little Help needed moving from lying on your back to sitting on the side of a flat bed without using bedrails?: A Lot Help needed moving to and from a bed to a chair (including a wheelchair)?: A Little Help needed standing up from a chair using your arms (e.g., wheelchair or bedside chair)?: A Lot Help needed to walk in hospital room?: A Little Help needed climbing 3-5 steps with a railing? : A Lot 6 Click Score: 15    End of Session Equipment Utilized During Treatment: Gait belt Activity Tolerance: Patient tolerated treatment well Patient left: in chair;with call bell/phone within reach;with chair alarm set;with family/visitor present   PT Visit Diagnosis: Difficulty in walking,  not elsewhere classified (R26.2);Muscle weakness (generalized) (M62.81)     Time: [id number] PT Time Calculation (min) (ACUTE ONLY): 24 min  Charges:  $Gait Training: 8-22 mins $Therapeutic Activity: 8-22 mins                     Elfriede Bonini L. Tamala Julian, [place] Pager [phone] 12/15/2018    Galen Manila 12/15/2018, 11:34 AM

## 2019-01-19 ENCOUNTER — Encounter: Payer: Self-pay | Admitting: Neurology

## 2019-01-19 NOTE — Progress Notes (Signed)
Virtual Visit via Video Note The purpose of this virtual visit is to provide medical care while limiting exposure to the novel coronavirus.    Consent was obtained for video visit:  Yes.   Answered questions that patient had about telehealth interaction:  Yes.   I discussed the limitations, risks, security and privacy concerns of performing an evaluation and management service by telemedicine. I also discussed with the patient that there may be a patient responsible charge related to this service. The patient expressed understanding and agreed to proceed.  Pt location: Home Physician Location: office Name of referring provider:  Aletha Halim., PA-C I connected with Caitlin Chavez at patients initiation/request on 01/20/2019 at  1:00 PM EST by video enabled telemedicine application and verified that I am speaking with the correct person using two identifiers. Pt MRN:  [id number] Pt DOB:  17-Sep-1934 Video Participants:  Caitlin Chavez;  Son supplements history   History of Present Illness:  Patient seen today in follow-up for essential tremor.  She is on primidone, 50 mg, 3 tablets in the morning and 2 tablets at night.  Tremor has been fairly stable, although she has better and worse days.  No side effects with medication.  Medical records are reviewed since last visit.  She has been in the hospital a few times since our last visit.  She was in the hospital in September for fever and generalized weakness and mechanical fall.  Because of the fall, they did have an MRI of the ankle that showed diffuse subcutaneous edema and disruption of the anterior talofibular ligament.  She was discharged to skilled nursing facility.  She did rehab and was discharged.  She came back to the hospital on November 10 due to confusion and fever.  She was found to have urosepsis.  She was also having loose stools during the hospitalization.  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  That treatment was done in the hospital with Flagyl.  Pt has been home for 4-5 weeks.  She has 24 hour per day care.  She is able to walk with a walker.  She is doing better than a month ago.  She is doing home PT/OT.  When she was in the hospital, she was found to have new onset atrial fibrillation and was placed on Eliquis.  She has been on that since her hospitalization and discharge (mid November).   Current Outpatient Medications on File Prior to Visit  Medication Sig Dispense Refill  . allopurinol (ZYLOPRIM) 100 MG tablet Take 100 mg by mouth 2 (two) times daily.    Marland Kitchen ALPRAZolam (XANAX) 0.5 MG tablet Take 0.5 mg by mouth 2 (two) times daily as needed for anxiety.    Marland Kitchen apixaban (ELIQUIS) 5 MG TABS tablet Take 1 tablet (5 mg total) by mouth 2 (two) times daily. 60 tablet 1  . Calcium Carbonate-Vitamin D (CALCIUM 600+D PO) Take 1 tablet by mouth daily.     . carvedilol (COREG) 6.25 MG tablet Take 6.25 mg by mouth 2 (two) times daily with a meal.     . cholecalciferol (VITAMIN D) 25 MCG (1000 UT) tablet Take 2,000 Units by mouth daily.    Marland Kitchen EPINEPHrine 0.3 mg/0.3 mL IJ SOAJ injection Inject 0.3 mLs (0.3 mg total) into the skin once as needed (for allergic reaction). 1 Device 2  . feeding supplement, ENSURE ENLIVE, (ENSURE ENLIVE) LIQD Take 237 mLs by mouth 2 (two) times  daily between meals. 237 mL 12  . gabapentin (NEURONTIN) 300 MG capsule Take 300 mg by mouth 2 (two) times daily.  3  . Menthol, Topical Analgesic, (BIOFREEZE) 4 % GEL Apply 1 application topically daily. Both knees    . Multiple Vitamins-Minerals (OCUVITE PO) Take 1 tablet by mouth daily.     Marland Kitchen nystatin-triamcinolone (MYCOLOG II) cream Apply 1 application topically 2 (two) times daily.    . pantoprazole (PROTONIX) 40 MG tablet Take 1 tablet (40 mg total) by mouth daily. 30 tablet 0  . primidone (MYSOLINE) 50 MG tablet 3 in AM, 2 in PM (Patient taking differently: Take 100-150 mg by mouth 2 (two) times daily. 3 in AM,  2 in PM) 450 tablet 0  . tamsulosin (FLOMAX) 0.4 MG CAPS capsule Take 1 capsule (0.4 mg total) by mouth daily. (Patient not taking: Reported on 12/09/2018) 30 capsule 0  . vitamin B-12 (CYANOCOBALAMIN) 100 MCG tablet Take 100 mcg by mouth daily.     No current facility-administered medications on file prior to visit.     Observations/Objective:   There were no vitals filed for this visit. GEN:  The patient appears stated age and is in NAD.  Neurological examination:  Orientation: The patient is alert and oriented x3. Cranial nerves: There is good facial symmetry. There is no facial hypomimia.  The speech is fluent and clear. Soft palate rises symmetrically and there is no tongue deviation. Hearing is intact to conversational tone. Motor: Strength is at least antigravity x 4.   Shoulder shrug is equal and symmetric.  There is no pronator drift.  Movement examination: Tone: unable Abnormal movements: No rest tremor.  She does have postural tremor bilaterally that is also present with intention. Coordination: Not tested Gait and Station: Not tested today    Assessment and Plan:    1.  Essential tremor  -I am going to slowly wean the patient off of primidone because of interaction with Eliquis, which she was placed on about a month ago.  She is currently on 3 tablets in the morning and 2 tablets at night.  She will drop to 1 tablet off of her medication every week until the medication is discontinued (so this week she will take 2 tablets twice per day, then 1 tablet in the morning and 2 tablets at night, then 1 tablet twice daily, then 1 tablet daily and then discontinue).  -Am not going to start anything else for tremor, because of the risk of anticholinergic side effects. 2.  Recent hospitalization for urosepsis  -now back home 3.  New onset atrial fibrillation  -Patient was in the hospital in November and placed on Eliquis.  Discussed with patient and son the interaction between  Eliquis and primidone.  She should not be on the primidone with it.  She is to contact her primary care physician, but I will go ahead and take her off of the primidone.  Unless the Eliquis can be replaced with something else (Coumadin, perhaps Pradaxa), then we will not be able to reinitiate the primidone.  Discussed with them that other options for tremor are very limited. Follow Up Instructions: Asked patient/son to call me in a few weeks and let me know what the result of talking with the primary care physician was.  We will make a follow-up depending on that.   -I discussed the assessment and treatment plan with the patient. The patient was provided an opportunity to ask questions and all were answered. The  patient agreed with the plan and demonstrated an understanding of the instructions.   The patient was advised to call back or seek an in-person evaluation if the symptoms worsen or if the condition fails to improve as anticipated.    Total Time spent in visit with the patient was:  15 min, of which more than 50% of the time was spent in counseling regarding medication interaction, as above.   Pt understands and agrees with the plan of care outlined.     Alonza Bogus, DO

## 2019-01-20 ENCOUNTER — Telehealth (INDEPENDENT_AMBULATORY_CARE_PROVIDER_SITE_OTHER): Payer: Medicare Other | Admitting: Neurology

## 2019-01-20 ENCOUNTER — Other Ambulatory Visit: Payer: Self-pay

## 2019-01-20 VITALS — BP 125/70 | Wt 160.0 lb

## 2019-01-20 DIAGNOSIS — I4891 Unspecified atrial fibrillation: Secondary | ICD-10-CM

## 2019-01-20 DIAGNOSIS — G250 Essential tremor: Secondary | ICD-10-CM

## 2019-02-06 ENCOUNTER — Telehealth: Payer: Self-pay | Admitting: Neurology

## 2019-02-06 NOTE — Telephone Encounter (Signed)
Son left msg with after hours about patient being taken off a medication due to BP product. IS there another than Eloquist to take with Primidone? Thanks!

## 2019-02-06 NOTE — Telephone Encounter (Signed)
You didn't specify who "the provider" was who was asking but coumadin is fine (it does interact with the primidone but since the INR can be monitored, its generally ok).  There is some data to suggest that pradaxa is okay as well with the primidone but will leave that to the cardiologist (I assume that is who is monitoring - or maybe it is the new PCP)?  Is patient off of the primidone now?  If so, would need to restart that slow.  Also needs a f/u with me in the future if restarts it

## 2019-02-06 NOTE — Telephone Encounter (Signed)
Per Chart:   I am going to slowly wean the patient off of primidone because of interaction with Eliquis, which she was placed on about a month ago.  She is currently on 3 tablets in the morning and 2 tablets at night.  She will drop to 1 tablet off of her medication every week until the medication is discontinued (so this week she will take 2 tablets twice per day, then 1 tablet in the morning and 2 tablets at night, then 1 tablet twice daily, then 1 tablet daily and then discontinue).             -Am not going to start anything else for tremor, because of the risk of anticholinergic side effects.  The provider would like to know if he could switch her from the Eliquis to Warfarin or something else to assist patient?

## 2019-02-06 NOTE — Telephone Encounter (Signed)
Son was unaware of the providers name, but he is calling the office and will call back to make a appointment once a change is decided

## 2019-02-10 ENCOUNTER — Telehealth: Payer: Self-pay

## 2019-02-10 ENCOUNTER — Telehealth: Payer: Self-pay | Admitting: Neurology

## 2019-02-10 NOTE — Telephone Encounter (Signed)
Caitlin Chavez called back at Dr, Jaquita Folds office informed that it looks like Dr seagel prescribes her Eliquis and she was given pt pharmacy information .

## 2019-02-10 NOTE — Telephone Encounter (Signed)
Sharyn Lull from Dr. Luanna Salk office called requesting a call back from a clinical staff member about the patient's medications. They want to confirm a dosage for Eliquis.

## 2019-02-10 NOTE — Telephone Encounter (Signed)
Dr. Jaquita Folds nurse called with questions. Pt is decreasing primidone she is having increase in tremors, Per pt son she is having increase in tremors causing her to not be able to feed her self. Aslo Dr Jaquita Folds office is asking should she be in Eliquis 5 mg BID or does this need to be changed? Looks like Dr Neysa Bonito ordered this dose 12/10/18. Dr. Jaquita Folds office thought she was on 2.5mg  of eliquis,

## 2019-02-10 NOTE — Telephone Encounter (Signed)
I don't prescribe her eliquis.  They do or the cardiologist does so I have no idea the dose she is supposed to be on.   As they can see, the primidone and eliquis interact.  She cannot take it with the primidone so I don't have options for the tremor.  The son has called before about this.  Unless they can change the eliquis to something else (see prior phone note for options they could consider), the patient cannot be on primidone and she will unfortunately shake.

## 2019-02-11 NOTE — Telephone Encounter (Signed)
Spoke with Sharyn Lull at the Dr. Sharion Settler offices per Dr/ Tat  1.  I don't prescribe her eliquis.  They do or the cardiologist does so I have no idea the dose she is supposed to be on.    2.As they can see, the primidone and eliquis interact.  She cannot take it with the primidone so I don't have options for the tremor.  The son has called before about this.  Unless they can change the eliquis to something else (see prior phone note for options they could consider), the patient cannot be on primidone and she will unfortunately shake.

## 2019-02-14 IMAGING — CR DG CHEST 2V
2 series · 2 of 2 positions shown · non-contrast
Comparison: 07/11/2015

CLINICAL DATA: Shortness of breath

EXAM:
CHEST - 2 VIEW

[w chest pa]
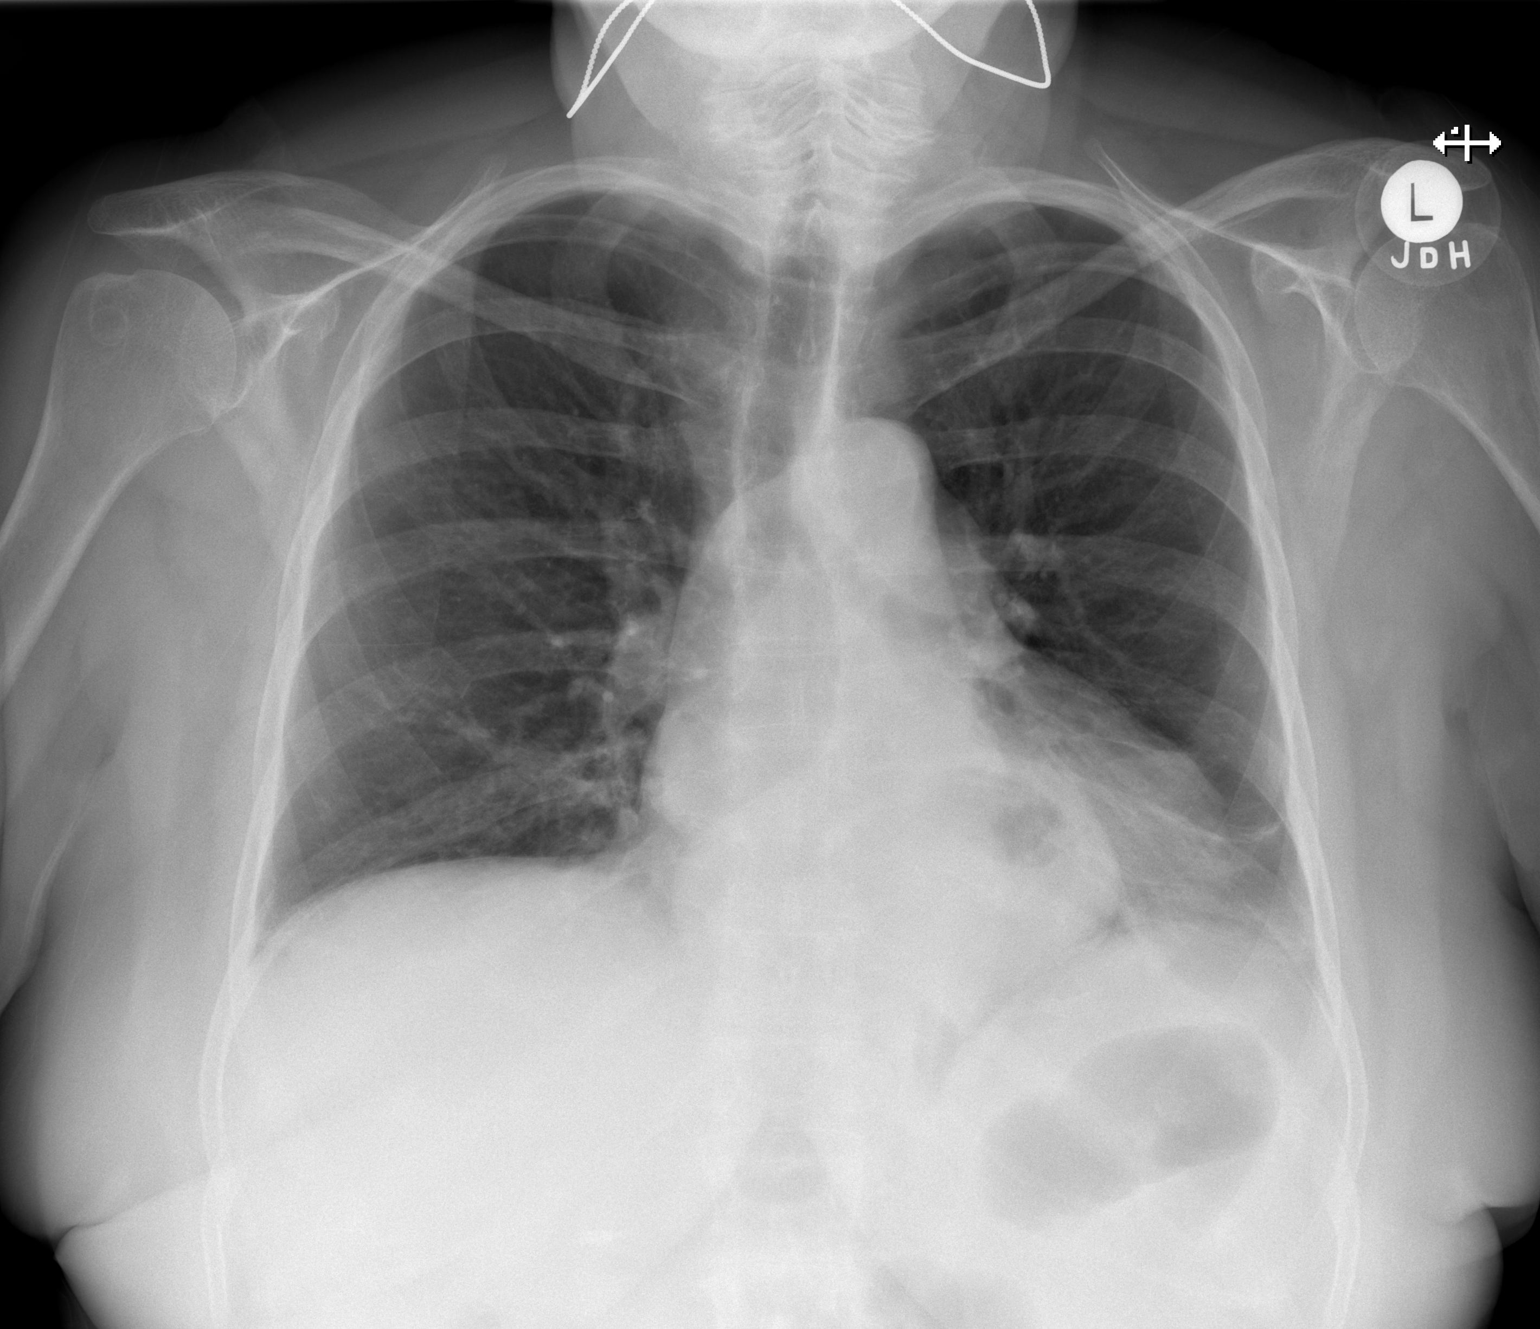

[w chest lat]
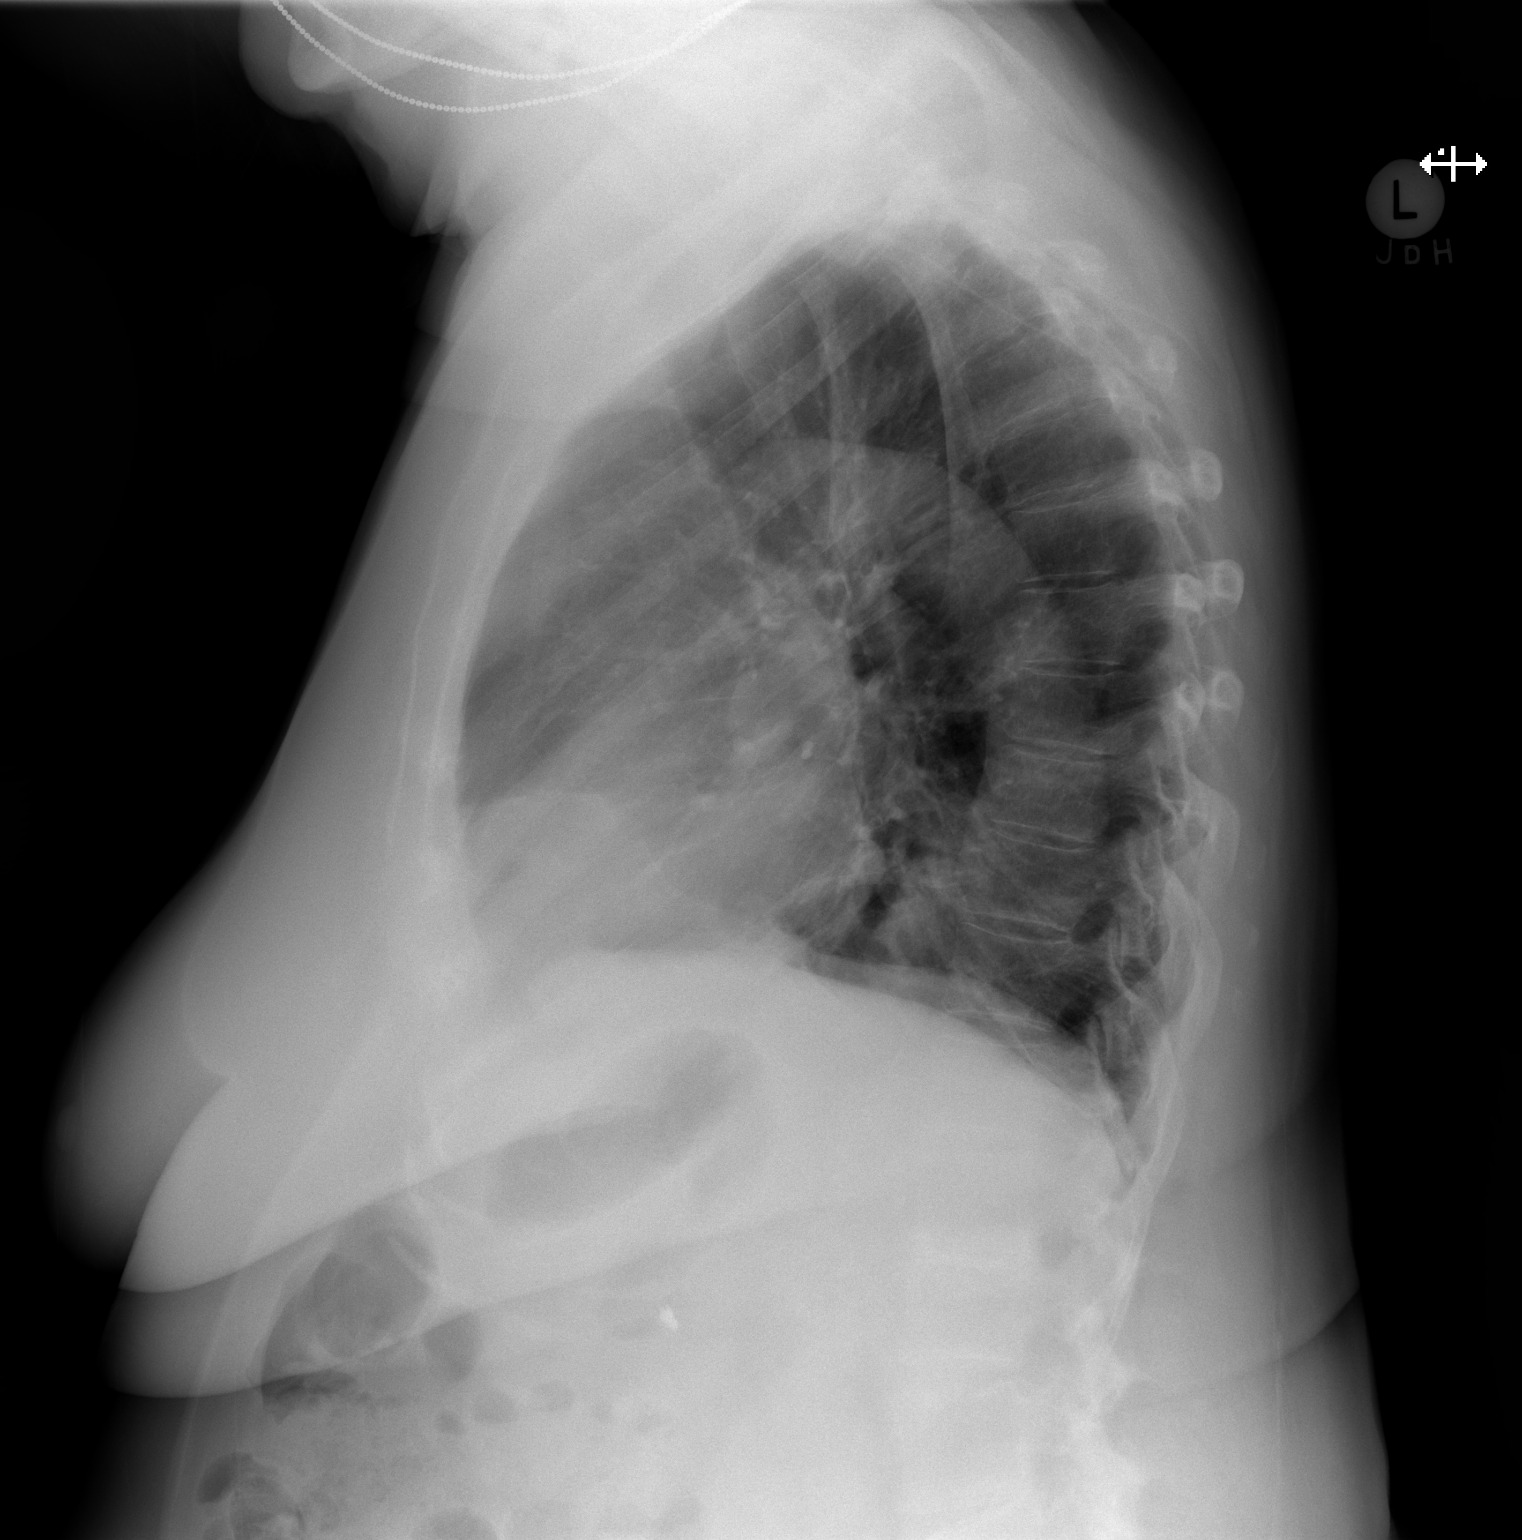

[2 of 2 positions shown; findings below may reference images not displayed]

FINDINGS: Large hiatal hernia. Heart is borderline in size. Scarring or
atelectasis in the lingula. Right lung clear. No effusions or acute
bony abnormality.
IMPRESSION: Lingular scar or atelectasis.

Large hiatal hernia.

## 2019-05-30 IMAGING — CR RIGHT WRIST - COMPLETE 3+ VIEW
4 series · 4 of 4 positions shown · non-contrast
Comparison: 06/18/2018

CLINICAL DATA: Comparison images, prior left wrist radiographs

EXAM:
RIGHT WRIST - COMPLETE 3+ VIEW

[x wrist pa right]
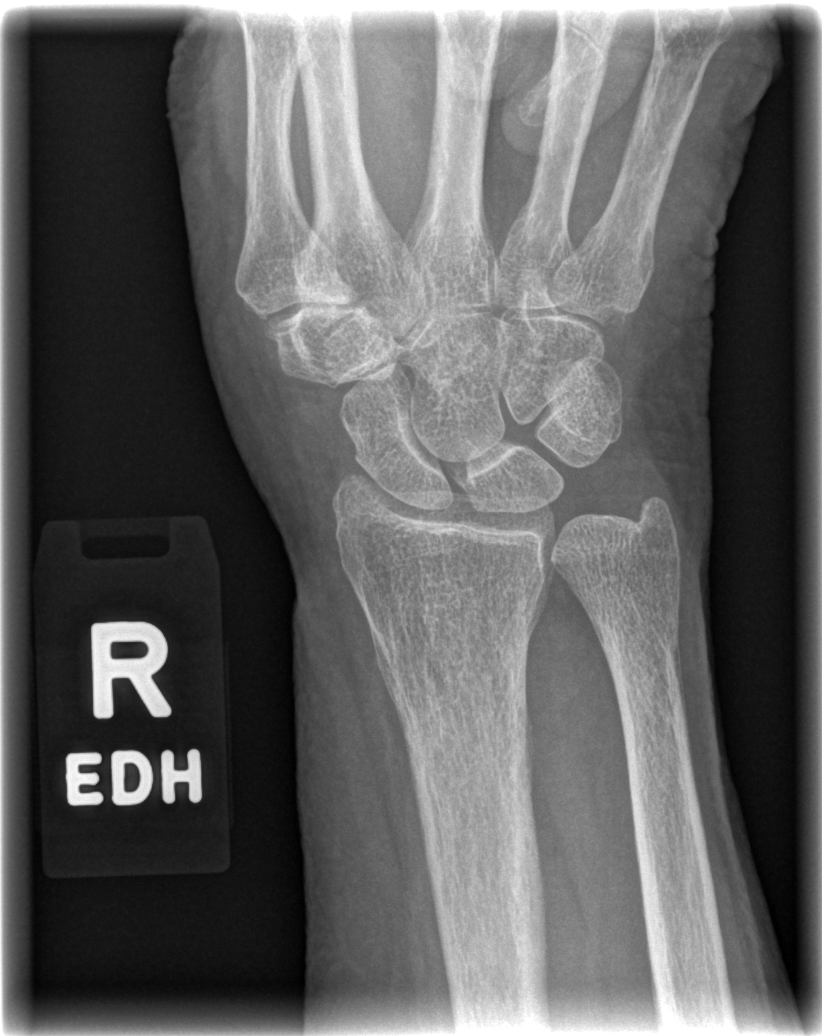

[x wrist obl right]
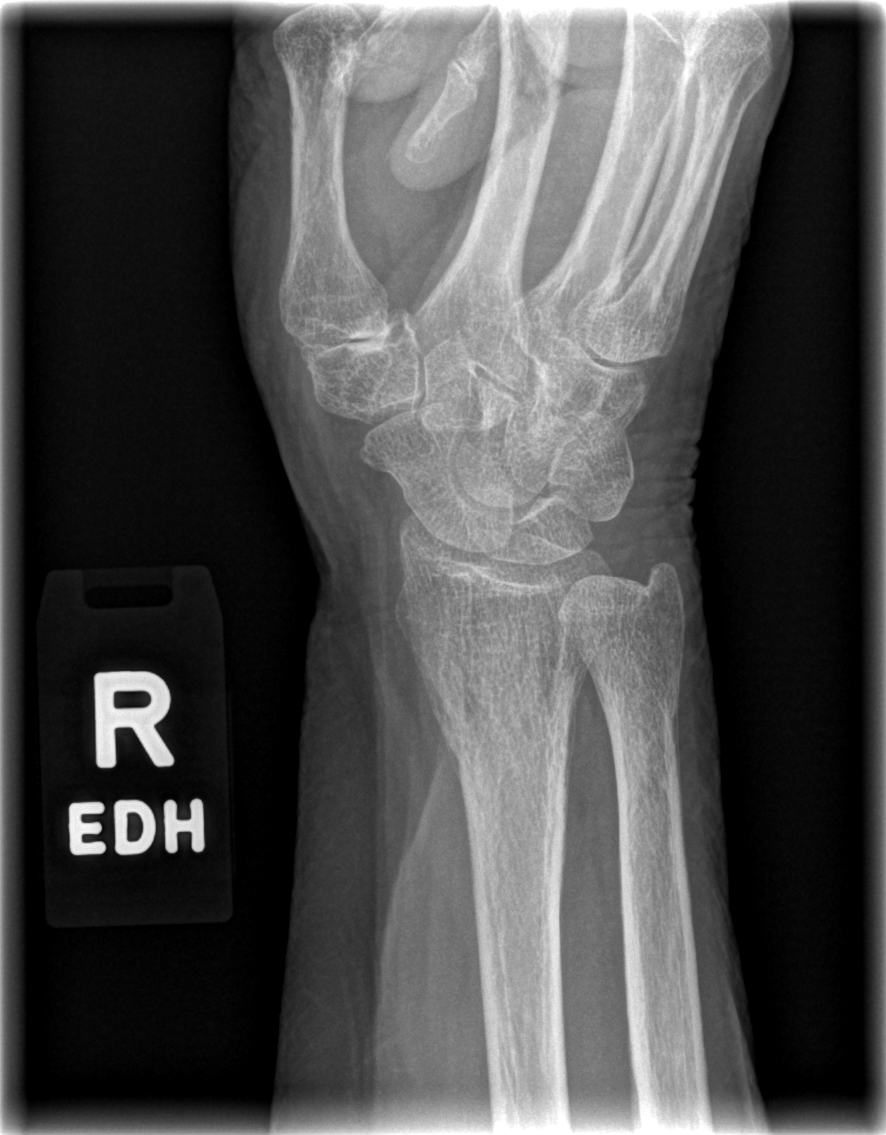

[x wrist lat right]
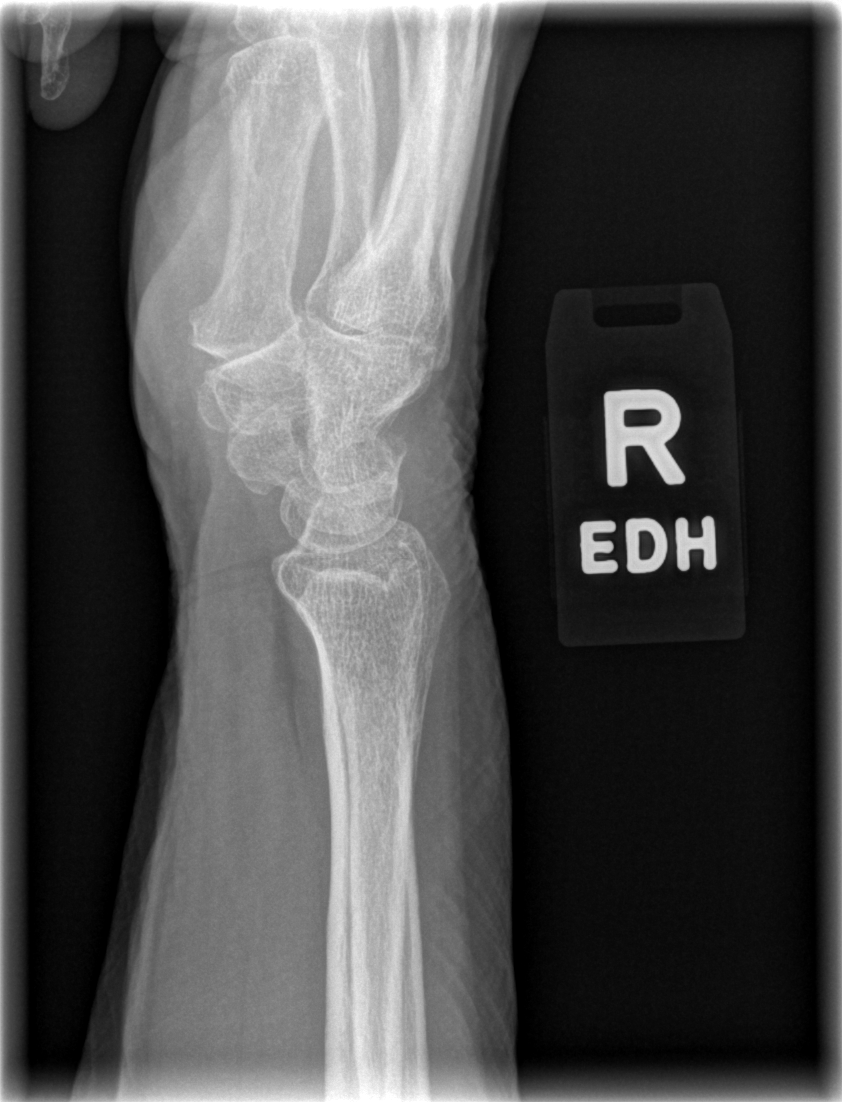

[x navicular]
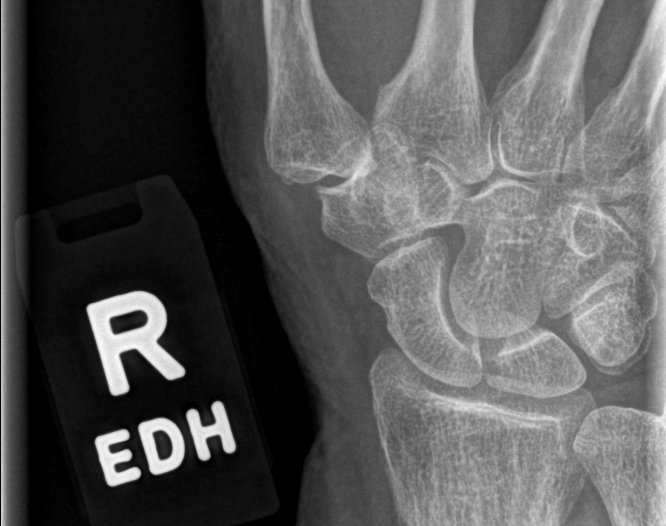

[4 of 4 positions shown; findings below may reference images not displayed]

FINDINGS: No fracture or dislocation of the right wrist. There is a symmetric,
slightly lucent appearance of the lateral distal radius and radial
styloid compared to the left wrist, in keeping with osteopenia.
There is no suspicious lesion. The carpus is normally aligned.
Minimal arthrosis.
IMPRESSION: No fracture or dislocation of the right wrist. There is a symmetric,
slightly lucent appearance of the lateral distal radius and radial
styloid compared to the left wrist, in keeping with osteopenia.
There is no suspicious lesion.

## 2019-11-18 IMAGING — DX DG CHEST 1V PORT
1 series · 1 of 1 positions shown · non-contrast
Comparison: Radiograph 10/28/2018, CT 08/12/2015

CLINICAL DATA: Fever. Weakness and confusion.

EXAM:
PORTABLE CHEST 1 VIEW

[chest ap]
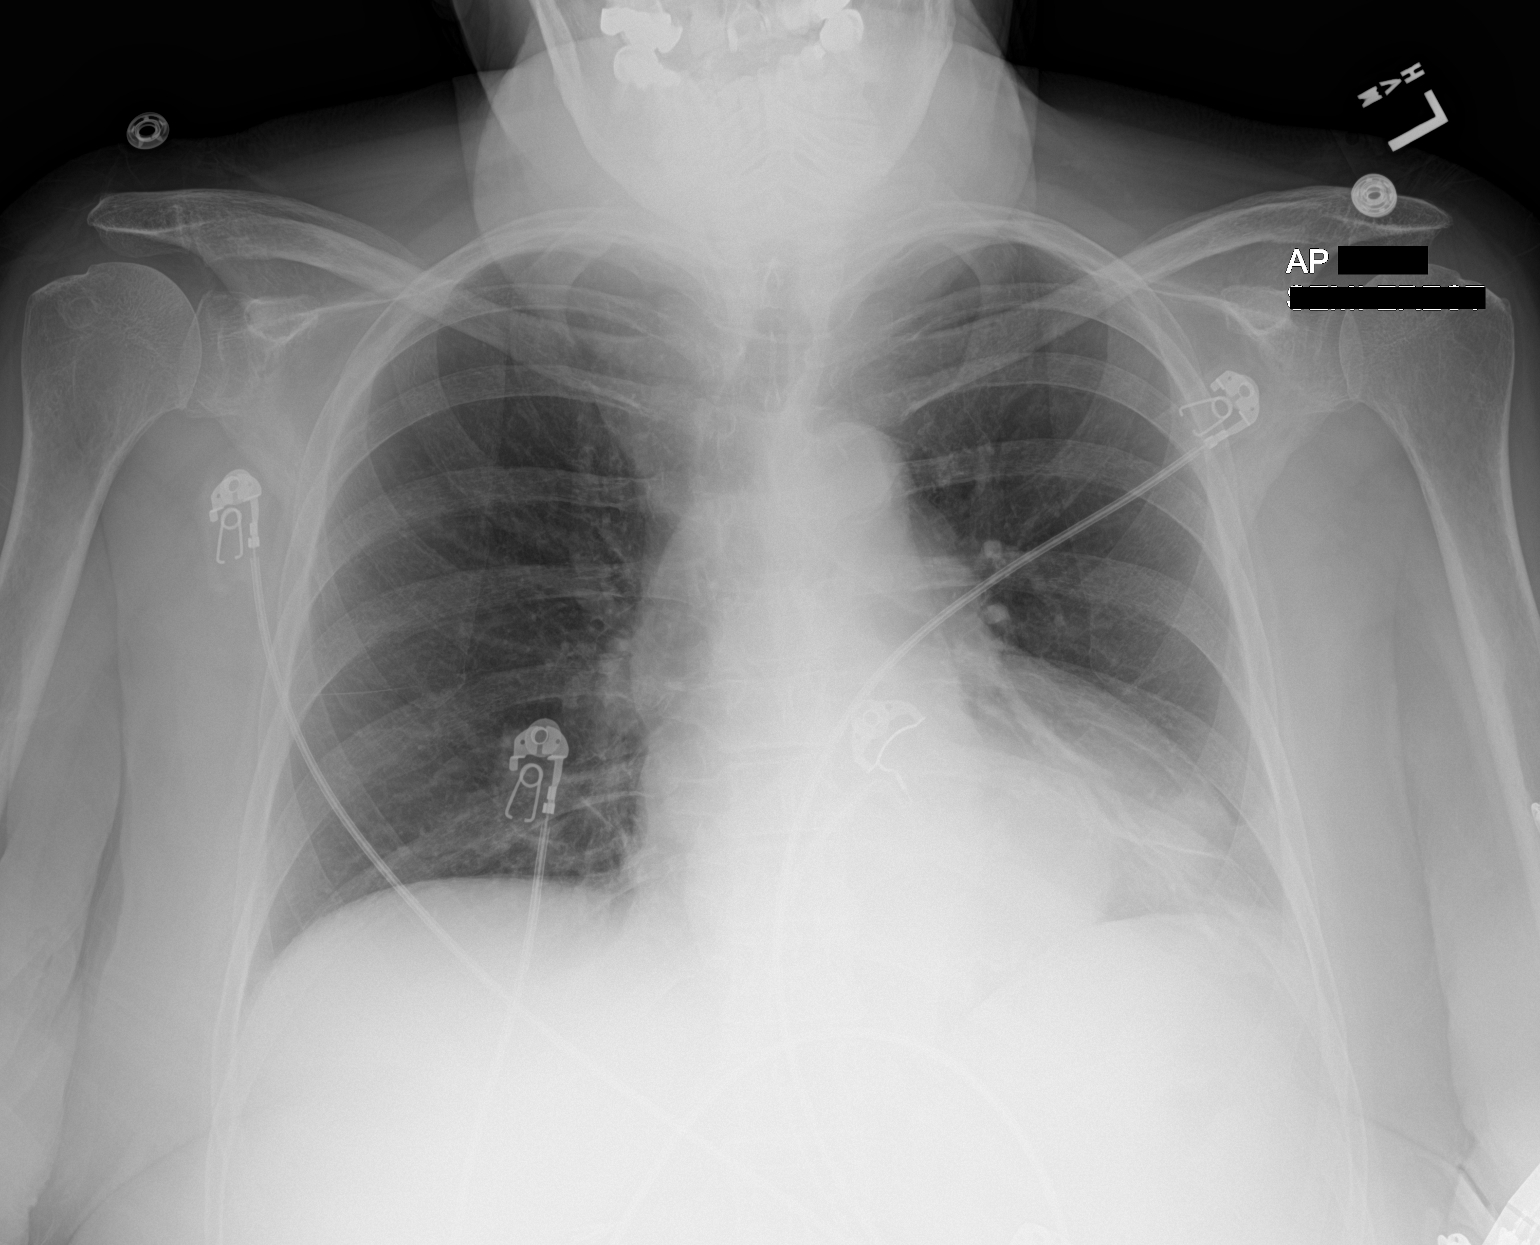

[1 of 1 positions shown; findings below may reference images not displayed]

FINDINGS: Upper normal heart size. Unchanged mediastinal contours. There is a
retrocardiac hiatal hernia. Linear scarring at the left lung base.
Pulmonary vasculature is normal. No consolidation, pleural effusion,
or pneumothorax. No acute osseous abnormalities are seen.
IMPRESSION: 1. No acute abnormality.
2. Retrocardiac hiatal hernia with linear scarring at the left lung
base.

## 2020-01-13 ENCOUNTER — Other Ambulatory Visit (HOSPITAL_COMMUNITY): Payer: Self-pay | Admitting: Internal Medicine

## 2020-01-13 ENCOUNTER — Other Ambulatory Visit: Payer: Self-pay | Admitting: Internal Medicine

## 2020-01-13 DIAGNOSIS — R1011 Right upper quadrant pain: Secondary | ICD-10-CM

## 2020-01-13 NOTE — Progress Notes (Signed)
New Patient Note  RE: Caitlin Chavez MRN: [id number] DOB: 1934/12/15 Date of Office Visit: 01/14/2020  Referring provider: No ref. provider found Primary care provider: Sueanne Margarita, DO  Chief Complaint: Allergy Testing  History of Present Illness: I had the pleasure of seeing Caitlin Chavez for initial evaluation at the [facility] on 01/14/2020. She is a 85 y.o. female, who is self-referred here for the evaluation of alpha gal allergy.  She is accompanied today by her son who provided/contributed to the history.   She reports food allergy to alpha gal. The reaction occurred about 4 years ago after she had 4 anaphylactic reactions.  She had cheeseburger during the first reaction and 4 hours afterwards she developed hives, felt like passing out, had trouble breathing, vomiting.   The symptoms lasted for a few hours. She was evaluated in ED and received epinephrine which helped.  Unfortunately the alpha-gal allergy was not diagnosed until after her 4th anaphylactic reaction.   Since this episode, she does report other accidental exposures to red meat. She had spaghetti sauce which was cooked with ground beef with no issues. Last night she had a small piece of ham with no issues.  No recent tick bites.    She does have access to epinephrine autoinjector and needed to use it.    Past work up includes: alpha gal level over 4 years ago was over 100 per patient report.  Dietary History: patient has been eating other foods including milk, eggs, peanut, treenuts, sesame, shellfish, fish, soy, wheat, meats - chicken, fruits and vegetables.  She reports reading labels and avoiding red meat in diet completely.  Assessment and Plan: Caitlin Chavez is a 85 y.o. female with: Allergy to alpha-gal Diagnosed with alpha gal allergy 4 years ago after 4 anaphylactic reactions.  Blood work was greater than 100 per patient report. Symptoms onset was 4 hours after ingestion - hives, ?LOC,  shortness of breath and vomiting. Since then she had 2 episodes of small amounts of exposure to ham and ground beef with no issues.  No recent tick bites.  No repeat alpha gal blood work.  Continue to avoid red meat - bee, pork, lamb. Get bloodwork for alpha gal.   If bloodwork is negative, will schedule for in office food challenge.  I have prescribed epinephrine injectable and demonstrated proper use. For mild symptoms you can take over the counter antihistamines such as Benadryl and monitor symptoms closely. If symptoms worsen or if you have severe symptoms including breathing issues, throat closure, significant swelling, whole body hives, severe diarrhea and vomiting, lightheadedness then inject epinephrine and seek immediate medical care afterwards.  Food action plan given.  Return if symptoms worsen or fail to improve.  Meds ordered this encounter  Medications  . EPINEPHrine 0.3 mg/0.3 mL IJ SOAJ injection    Sig: Inject 0.3 mg into the muscle as needed.    Dispense:  1 each    Refill:  2    Lab Orders     Alpha-Gal Panel  Other allergy screening: Asthma: no Rhino conjunctivitis: no Food allergy: yes  Medication allergy: no Hymenoptera allergy: no Urticaria: no Eczema:no History of recurrent infections suggestive of immunodeficency: no  Diagnostics: None.  Past Medical History: Patient Active Problem List   Diagnosis Date Noted  . Allergy to alpha-gal 01/14/2020  . Hypomagnesemia 12/10/2018  . Sepsis secondary to UTI ([place]) 12/09/2018  . Atrial fibrillation with RVR ([place]) 12/09/2018  . GERD (gastroesophageal reflux  disease)   . Peripheral neuropathy   . Anemia   . History of gout 11/01/2018  . FUO (fever of unknown origin) 10/31/2018  . Acute bilateral ankle pain 10/31/2018  . Failure to thrive in adult 10/29/2018  . Ankle fracture, right 10/29/2018  . Falls 10/28/2018  . Hiatal hernia 10/28/2018  . Hyponatremia 10/28/2018  . Generalized weakness 10/28/2018   . Essential hypertension 10/28/2018  . Hypoalbuminemia 10/28/2018  . Anxiety 10/28/2018  . Essential tremor 07/09/2017   Past Medical History:  Diagnosis Date  . Anxiety   . Arthritis    hands  . Carpal tunnel syndrome   . Colon polyps   . Diverticulosis   . FUO (fever of unknown origin) 10/31/2018  . GERD (gastroesophageal reflux disease)   . Hiatal hernia   . Hypertension   . Macular degeneration   . Peripheral neuropathy    Past Surgical History: Past Surgical History:  Procedure Laterality Date  . CATARACT EXTRACTION, BILATERAL    . CHOLECYSTECTOMY    . COLONOSCOPY    . LASIK    . POLYPECTOMY    . VAGINAL HYSTERECTOMY     Medication List:  Current Outpatient Medications  Medication Sig Dispense Refill  . allopurinol (ZYLOPRIM) 100 MG tablet Take 100 mg by mouth 2 (two) times daily.    Marland Kitchen ALPRAZolam (XANAX) 0.5 MG tablet Take 0.5 mg by mouth 2 (two) times daily as needed for anxiety.    Marland Kitchen apixaban (ELIQUIS) 5 MG TABS tablet Take 1 tablet (5 mg total) by mouth 2 (two) times daily. 60 tablet 1  . Calcium Carbonate-Vitamin D (CALCIUM 600+D PO) Take 1 tablet by mouth daily.     . carvedilol (COREG) 6.25 MG tablet Take 6.25 mg by mouth 2 (two) times daily with a meal.     . cholecalciferol (VITAMIN D) 25 MCG (1000 UT) tablet Take 2,000 Units by mouth daily.    Marland Kitchen gabapentin (NEURONTIN) 300 MG capsule Take 300 mg by mouth 2 (two) times daily.  3  . hydrochlorothiazide (MICROZIDE) 12.5 MG capsule Take 12.5 mg by mouth daily.    . Menthol, Topical Analgesic, (BIOFREEZE) 4 % GEL Apply 1 application topically daily. Both knees    . Multiple Vitamins-Minerals (OCUVITE PO) Take 1 tablet by mouth daily.     Marland Kitchen omeprazole (PRILOSEC) 40 MG capsule Take 40 mg by mouth daily.    . primidone (MYSOLINE) 50 MG tablet 3 in AM, 2 in PM (Patient taking differently: Take 100-150 mg by mouth 2 (two) times daily. 3 in AM, 2 in PM) 450 tablet 0  . vitamin B-12 (CYANOCOBALAMIN) 100 MCG tablet Take  100 mcg by mouth daily.    Marland Kitchen EPINEPHrine 0.3 mg/0.3 mL IJ SOAJ injection Inject 0.3 mg into the muscle as needed. 1 each 2   No current facility-administered medications for this visit.   Allergies: Allergies  Allergen Reactions  . Alka-Seltzer [Aspirin Effervescent] Anaphylaxis and Hives  . Beef-Derived Products Anaphylaxis  . Nsaids Anaphylaxis and Hives  . Other Anaphylaxis and Rash  . Pork-Derived Products Anaphylaxis  . Codeine Hives and Nausea And Vomiting   Social History: Social History   Socioeconomic History  . Marital status: Single    Spouse name: Not on file  . Number of children: 2  . Years of education: Not on file  . Highest education level: Not on file  Occupational History  . Occupation: retired    Comment: Customer service manager  Tobacco Use  . Smoking status: Never  Smoker  . Smokeless tobacco: Never Used  Vaping Use  . Vaping Use: Never used  Substance and Sexual Activity  . Alcohol use: No  . Drug use: No  . Sexual activity: Never  Other Topics Concern  . Not on file  Social History Narrative   Widow.  Lives alone.  5 grandchildren and 5 great grandchildren.    Social Determinants of Health   Financial Resource Strain: Not on file  Food Insecurity: Not on file  Transportation Needs: Not on file  Physical Activity: Not on file  Stress: Not on file  Social Connections: Not on file   Lives in a house which is 83 years old. Smoking: denies Occupation: retired  Programme researcher, broadcasting/film/video HistoryFreight forwarder in the house: no Charity fundraiser in the family room: no Carpet in the bedroom: no Heating: oil Cooling: central Pet: no  Family History: Family History  Problem Relation Age of Onset  . Heart attack Father 40  . Alcohol abuse Father   . Heart failure Mother 81       chf  . Heart disease Mother   . Stomach cancer Maternal Grandmother   . Prostate cancer Son   . Colon cancer Neg Hx    Problem                               Relation Asthma                                    Son  Eczema                                No  Food allergy                          Son   Review of Systems  Constitutional: Negative for appetite change, chills, fever and unexpected weight change.  HENT: Negative for congestion and rhinorrhea.   Eyes: Negative for itching.  Respiratory: Negative for cough, chest tightness, shortness of breath and wheezing.   Cardiovascular: Negative for chest pain.  Gastrointestinal: Negative for abdominal pain.  Genitourinary: Negative for difficulty urinating.  Skin: Negative for rash.  Allergic/Immunologic: Positive for food allergies.  Neurological: Negative for headaches.   Objective: BP 126/72   Pulse 81   Temp (!) 97.4 F (36.3 C) (Temporal)   Resp 16   Ht 5' 2.5" (1.588 m)   Wt 164 lb 12 oz (74.7 kg)   SpO2 98%   BMI 29.65 kg/m  Body mass index is 29.65 kg/m. Physical Exam Vitals and nursing note reviewed. Exam conducted with a chaperone present.  Constitutional:      Appearance: Normal appearance. She is well-developed.  HENT:     Head: Normocephalic and atraumatic.     Right Ear: External ear normal.     Left Ear: External ear normal.     Nose: Nose normal.     Mouth/Throat:     Mouth: Mucous membranes are moist.     Pharynx: Oropharynx is clear.  Eyes:     Conjunctiva/sclera: Conjunctivae normal.  Cardiovascular:     Rate and Rhythm: Normal rate and regular rhythm.     Heart sounds: Normal heart sounds. No murmur heard. No friction rub. No gallop.   Pulmonary:  Effort: Pulmonary effort is normal.     Breath sounds: Normal breath sounds. No wheezing, rhonchi or rales.  Musculoskeletal:     Cervical back: Neck supple.  Skin:    General: Skin is warm.     Findings: No rash.  Neurological:     Mental Status: She is alert and oriented to person, place, and time.  Psychiatric:        Behavior: Behavior normal.    The plan was reviewed with the patient/family, and all questions/concerned were  addressed.  It was my pleasure to see Caitlin Chavez today and participate in her care. Please feel free to contact me with any questions or concerns.  Sincerely,  Rexene Alberts, DO Allergy & Immunology  [facility] office: [place] office: [phone]

## 2020-01-14 ENCOUNTER — Other Ambulatory Visit: Payer: Self-pay

## 2020-01-14 ENCOUNTER — Ambulatory Visit (INDEPENDENT_AMBULATORY_CARE_PROVIDER_SITE_OTHER): Payer: Medicare Other | Admitting: Allergy

## 2020-01-14 ENCOUNTER — Encounter: Payer: Self-pay | Admitting: Allergy

## 2020-01-14 VITALS — BP 126/72 | HR 81 | Temp 97.4°F | Resp 16 | Ht 62.5 in | Wt 164.8 lb

## 2020-01-14 DIAGNOSIS — Z91018 Allergy to other foods: Secondary | ICD-10-CM | POA: Insufficient documentation

## 2020-01-14 MED ORDER — EPINEPHRINE 0.3 MG/0.3ML IJ SOAJ: 0.3000 mg | INTRAMUSCULAR | 2 refills | Status: AC | PRN

## 2020-01-14 NOTE — Assessment & Plan Note (Addendum)
Diagnosed with alpha gal allergy 4 years ago after 4 anaphylactic reactions.  Blood work was greater than 100 per patient report. Symptoms onset was 4 hours after ingestion - hives, ?LOC, shortness of breath and vomiting. Since then she had 2 episodes of small amounts of exposure to ham and ground beef with no issues.  No recent tick bites.  No repeat alpha gal blood work.  Continue to avoid red meat - bee, pork, lamb. Get bloodwork for alpha gal.   If bloodwork is negative, will schedule for in office food challenge.  I have prescribed epinephrine injectable and demonstrated proper use. For mild symptoms you can take over the counter antihistamines such as Benadryl and monitor symptoms closely. If symptoms worsen or if you have severe symptoms including breathing issues, throat closure, significant swelling, whole body hives, severe diarrhea and vomiting, lightheadedness then inject epinephrine and seek immediate medical care afterwards.  Food action plan given.

## 2020-01-14 NOTE — Patient Instructions (Addendum)
Alpha-gal allergy:  Continue to avoid red meat - bee, pork, lamb. Get bloodwork:  We are ordering labs, so please allow 1-2 weeks for the results to come back. With the newly implemented Cures Act, the labs might be visible to you at the same time that they become visible to me. However, I will not address the results until all of the results are back, so please be patient.  In the meantime, continue recommendations in your patient instructions, including avoidance measures (if applicable), until you hear from me.   If bloodwork is negative, will schedule for in office food challenge.  You must be off antihistamines for 3-5 days before. Must be in good health and not ill. Plan on being in the office for 4 hours and must bring in the food you want to do the oral challenge for - burger patty.    I have prescribed epinephrine injectable and demonstrated proper use. For mild symptoms you can take over the counter antihistamines such as Benadryl and monitor symptoms closely. If symptoms worsen or if you have severe symptoms including breathing issues, throat closure, significant swelling, whole body hives, severe diarrhea and vomiting, lightheadedness then inject epinephrine and seek immediate medical care afterwards.  Food action plan given.  Follow up depending on bloodwork results.

## 2020-01-15 ENCOUNTER — Ambulatory Visit (HOSPITAL_COMMUNITY): Admission: RE | Admit: 2020-01-15 | Payer: Medicare Other | Source: Ambulatory Visit

## 2020-01-15 ENCOUNTER — Encounter (HOSPITAL_COMMUNITY): Payer: Self-pay

## 2020-01-17 LAB — ALPHA-GAL PANEL
Allergen Lamb IgE: 4.59 kU/L — AB
Beef IgE: 7.79 kU/L — AB
IgE (Immunoglobulin E), Serum: 236 IU/mL (ref 6–495)
O215-IgE Alpha-Gal: 15.7 kU/L — AB
Pork IgE: 3.92 kU/L — AB

## 2021-08-31 ENCOUNTER — Other Ambulatory Visit: Payer: Self-pay

## 2021-08-31 ENCOUNTER — Emergency Department (HOSPITAL_COMMUNITY): Payer: Medicare Other

## 2021-08-31 ENCOUNTER — Encounter (HOSPITAL_COMMUNITY): Payer: Self-pay

## 2021-08-31 ENCOUNTER — Observation Stay (HOSPITAL_COMMUNITY): Payer: Medicare Other

## 2021-08-31 ENCOUNTER — Inpatient Hospital Stay (HOSPITAL_COMMUNITY)
Admission: EM | Admit: 2021-08-31 | Discharge: 2021-09-13 | DRG: 871 | Disposition: A | Discharge status: Skilled Nursing Facility | Payer: Medicare Other | Attending: Internal Medicine | Admitting: Internal Medicine

## 2021-08-31 DIAGNOSIS — Z86010 Personal history of colonic polyps: Secondary | ICD-10-CM

## 2021-08-31 DIAGNOSIS — R230 Cyanosis: Secondary | ICD-10-CM | POA: Diagnosis not present

## 2021-08-31 DIAGNOSIS — K862 Cyst of pancreas: Secondary | ICD-10-CM | POA: Diagnosis present

## 2021-08-31 DIAGNOSIS — K8689 Other specified diseases of pancreas: Secondary | ICD-10-CM | POA: Diagnosis present

## 2021-08-31 DIAGNOSIS — F32A Depression, unspecified: Secondary | ICD-10-CM | POA: Diagnosis present

## 2021-08-31 DIAGNOSIS — F419 Anxiety disorder, unspecified: Secondary | ICD-10-CM | POA: Diagnosis present

## 2021-08-31 DIAGNOSIS — Z7901 Long term (current) use of anticoagulants: Secondary | ICD-10-CM

## 2021-08-31 DIAGNOSIS — A419 Sepsis, unspecified organism: Principal | ICD-10-CM | POA: Diagnosis present

## 2021-08-31 DIAGNOSIS — G8929 Other chronic pain: Secondary | ICD-10-CM | POA: Diagnosis present

## 2021-08-31 DIAGNOSIS — Z91014 Allergy to mammalian meats: Secondary | ICD-10-CM

## 2021-08-31 DIAGNOSIS — G9341 Metabolic encephalopathy: Secondary | ICD-10-CM | POA: Diagnosis present

## 2021-08-31 DIAGNOSIS — E222 Syndrome of inappropriate secretion of antidiuretic hormone: Secondary | ICD-10-CM | POA: Diagnosis present

## 2021-08-31 DIAGNOSIS — Z66 Do not resuscitate: Secondary | ICD-10-CM | POA: Diagnosis not present

## 2021-08-31 DIAGNOSIS — I472 Ventricular tachycardia, unspecified: Secondary | ICD-10-CM | POA: Diagnosis present

## 2021-08-31 DIAGNOSIS — Z87440 Personal history of urinary (tract) infections: Secondary | ICD-10-CM

## 2021-08-31 DIAGNOSIS — R531 Weakness: Secondary | ICD-10-CM | POA: Diagnosis not present

## 2021-08-31 DIAGNOSIS — R948 Abnormal results of function studies of other organs and systems: Secondary | ICD-10-CM | POA: Diagnosis present

## 2021-08-31 DIAGNOSIS — E876 Hypokalemia: Secondary | ICD-10-CM

## 2021-08-31 DIAGNOSIS — K567 Ileus, unspecified: Secondary | ICD-10-CM | POA: Diagnosis not present

## 2021-08-31 DIAGNOSIS — R627 Adult failure to thrive: Secondary | ICD-10-CM | POA: Diagnosis present

## 2021-08-31 DIAGNOSIS — Z9842 Cataract extraction status, left eye: Secondary | ICD-10-CM

## 2021-08-31 DIAGNOSIS — Z8249 Family history of ischemic heart disease and other diseases of the circulatory system: Secondary | ICD-10-CM

## 2021-08-31 DIAGNOSIS — M542 Cervicalgia: Secondary | ICD-10-CM | POA: Diagnosis present

## 2021-08-31 DIAGNOSIS — M19042 Primary osteoarthritis, left hand: Secondary | ICD-10-CM | POA: Diagnosis present

## 2021-08-31 DIAGNOSIS — M19041 Primary osteoarthritis, right hand: Secondary | ICD-10-CM | POA: Diagnosis present

## 2021-08-31 DIAGNOSIS — R935 Abnormal findings on diagnostic imaging of other abdominal regions, including retroperitoneum: Secondary | ICD-10-CM

## 2021-08-31 DIAGNOSIS — I10 Essential (primary) hypertension: Secondary | ICD-10-CM | POA: Diagnosis not present

## 2021-08-31 DIAGNOSIS — I480 Paroxysmal atrial fibrillation: Secondary | ICD-10-CM | POA: Diagnosis present

## 2021-08-31 DIAGNOSIS — E860 Dehydration: Secondary | ICD-10-CM | POA: Diagnosis present

## 2021-08-31 DIAGNOSIS — R54 Age-related physical debility: Secondary | ICD-10-CM | POA: Diagnosis present

## 2021-08-31 DIAGNOSIS — I5032 Chronic diastolic (congestive) heart failure: Secondary | ICD-10-CM | POA: Diagnosis present

## 2021-08-31 DIAGNOSIS — G629 Polyneuropathy, unspecified: Secondary | ICD-10-CM | POA: Diagnosis present

## 2021-08-31 DIAGNOSIS — Z886 Allergy status to analgesic agent status: Secondary | ICD-10-CM

## 2021-08-31 DIAGNOSIS — I482 Chronic atrial fibrillation, unspecified: Secondary | ICD-10-CM | POA: Diagnosis present

## 2021-08-31 DIAGNOSIS — K439 Ventral hernia without obstruction or gangrene: Secondary | ICD-10-CM | POA: Diagnosis present

## 2021-08-31 DIAGNOSIS — K449 Diaphragmatic hernia without obstruction or gangrene: Secondary | ICD-10-CM | POA: Diagnosis present

## 2021-08-31 DIAGNOSIS — Z6829 Body mass index (BMI) 29.0-29.9, adult: Secondary | ICD-10-CM

## 2021-08-31 DIAGNOSIS — J9811 Atelectasis: Secondary | ICD-10-CM | POA: Diagnosis present

## 2021-08-31 DIAGNOSIS — Z885 Allergy status to narcotic agent status: Secondary | ICD-10-CM

## 2021-08-31 DIAGNOSIS — Z79899 Other long term (current) drug therapy: Secondary | ICD-10-CM

## 2021-08-31 DIAGNOSIS — Z90710 Acquired absence of both cervix and uterus: Secondary | ICD-10-CM

## 2021-08-31 DIAGNOSIS — G5600 Carpal tunnel syndrome, unspecified upper limb: Secondary | ICD-10-CM | POA: Diagnosis present

## 2021-08-31 DIAGNOSIS — K922 Gastrointestinal hemorrhage, unspecified: Secondary | ICD-10-CM | POA: Diagnosis present

## 2021-08-31 DIAGNOSIS — E669 Obesity, unspecified: Secondary | ICD-10-CM | POA: Diagnosis present

## 2021-08-31 DIAGNOSIS — D62 Acute posthemorrhagic anemia: Secondary | ICD-10-CM | POA: Diagnosis present

## 2021-08-31 DIAGNOSIS — D6869 Other thrombophilia: Secondary | ICD-10-CM | POA: Diagnosis present

## 2021-08-31 DIAGNOSIS — K219 Gastro-esophageal reflux disease without esophagitis: Secondary | ICD-10-CM | POA: Diagnosis present

## 2021-08-31 DIAGNOSIS — Z9841 Cataract extraction status, right eye: Secondary | ICD-10-CM

## 2021-08-31 DIAGNOSIS — R339 Retention of urine, unspecified: Secondary | ICD-10-CM | POA: Diagnosis not present

## 2021-08-31 DIAGNOSIS — I110 Hypertensive heart disease with heart failure: Secondary | ICD-10-CM | POA: Diagnosis present

## 2021-08-31 DIAGNOSIS — Z9049 Acquired absence of other specified parts of digestive tract: Secondary | ICD-10-CM

## 2021-08-31 DIAGNOSIS — E8809 Other disorders of plasma-protein metabolism, not elsewhere classified: Secondary | ICD-10-CM | POA: Diagnosis present

## 2021-08-31 DIAGNOSIS — H3530 Unspecified macular degeneration: Secondary | ICD-10-CM | POA: Diagnosis present

## 2021-08-31 DIAGNOSIS — K921 Melena: Secondary | ICD-10-CM | POA: Diagnosis not present

## 2021-08-31 DIAGNOSIS — K5732 Diverticulitis of large intestine without perforation or abscess without bleeding: Secondary | ICD-10-CM | POA: Diagnosis present

## 2021-08-31 LAB — URINALYSIS, ROUTINE W REFLEX MICROSCOPIC
Bilirubin Urine: NEGATIVE
Glucose, UA: NEGATIVE mg/dL
Hgb urine dipstick: NEGATIVE
Ketones, ur: 20 mg/dL — AB
Leukocytes,Ua: NEGATIVE
Nitrite: NEGATIVE
Protein, ur: NEGATIVE mg/dL
Specific Gravity, Urine: 1.016 (ref 1.005–1.030)
pH: 6 (ref 5.0–8.0)

## 2021-08-31 LAB — COMPREHENSIVE METABOLIC PANEL
ALT: 39 U/L (ref 0–44)
AST: 27 U/L (ref 15–41)
Albumin: 2.8 g/dL — ABNORMAL LOW (ref 3.5–5.0)
Alkaline Phosphatase: 74 U/L (ref 38–126)
Anion gap: 15 (ref 5–15)
BUN: 21 mg/dL (ref 8–23)
CO2: 24 mmol/L (ref 22–32)
Calcium: 8.6 mg/dL — ABNORMAL LOW (ref 8.9–10.3)
Chloride: 94 mmol/L — ABNORMAL LOW (ref 98–111)
Creatinine, Ser: 0.99 mg/dL (ref 0.44–1.00)
GFR, Estimated: 56 mL/min — ABNORMAL LOW (ref >60)
Glucose, Bld: 107 mg/dL — ABNORMAL HIGH (ref 70–99)
Potassium: 3.3 mmol/L — ABNORMAL LOW (ref 3.5–5.1)
Sodium: 133 mmol/L — ABNORMAL LOW (ref 135–145)
Total Bilirubin: 2 mg/dL — ABNORMAL HIGH (ref 0.3–1.2)
Total Protein: 6.8 g/dL (ref 6.5–8.1)

## 2021-08-31 LAB — CBC
HCT: 34.4 % — ABNORMAL LOW (ref 36.0–46.0)
Hemoglobin: 11.5 g/dL — ABNORMAL LOW (ref 12.0–15.0)
MCH: 31.7 pg (ref 26.0–34.0)
MCHC: 33.4 g/dL (ref 30.0–36.0)
MCV: 94.8 fL (ref 80.0–100.0)
Platelets: 240 10*3/uL (ref 150–400)
RBC: 3.63 MIL/uL — ABNORMAL LOW (ref 3.87–5.11)
RDW: 13.9 % (ref 11.5–15.5)
WBC: 14.7 10*3/uL — ABNORMAL HIGH (ref 4.0–10.5)
nRBC: 0 % (ref 0.0–0.2)

## 2021-08-31 LAB — PROTIME-INR
INR: 2 — ABNORMAL HIGH (ref 0.8–1.2)
Prothrombin Time: 22.4 seconds — ABNORMAL HIGH (ref 11.4–15.2)

## 2021-08-31 LAB — CBG MONITORING, ED: Glucose-Capillary: 92 mg/dL (ref 70–99)

## 2021-08-31 LAB — APTT: aPTT: 40 seconds — ABNORMAL HIGH (ref 24–36)

## 2021-08-31 LAB — LACTIC ACID, PLASMA
Lactic Acid, Venous: 1.1 mmol/L (ref 0.5–1.9)
Lactic Acid, Venous: 1.2 mmol/L (ref 0.5–1.9)

## 2021-08-31 LAB — MAGNESIUM: Magnesium: 1.8 mg/dL (ref 1.7–2.4)

## 2021-08-31 MED ORDER — POTASSIUM CHLORIDE 10 MEQ/100ML IV SOLN
10.0000 meq | INTRAVENOUS | Status: AC
  Administered 2021-08-31 – 2021-09-01 (×3): 10 meq via INTRAVENOUS
  Filled 2021-08-31 (×4): qty 100

## 2021-08-31 MED ORDER — LACTATED RINGERS IV BOLUS
500.0000 mL | Freq: Once | INTRAVENOUS | Status: AC
Start: 2021-08-31 — End: 2021-08-31
  Administered 2021-08-31: 500 mL via INTRAVENOUS

## 2021-08-31 MED ORDER — POTASSIUM CHLORIDE 10 MEQ/100ML IV SOLN
10.0000 meq | Freq: Once | INTRAVENOUS | Status: AC
Start: 2021-08-31 — End: 2021-08-31
  Administered 2021-08-31: 10 meq via INTRAVENOUS
  Filled 2021-08-31: qty 100

## 2021-08-31 MED ORDER — VANCOMYCIN HCL 1500 MG/300ML IV SOLN
1500.0000 mg | Freq: Once | INTRAVENOUS | Status: AC
  Administered 2021-08-31: 1500 mg via INTRAVENOUS
  Filled 2021-08-31: qty 300

## 2021-08-31 MED ORDER — APIXABAN 5 MG PO TABS
5.0000 mg | ORAL_TABLET | Freq: Two times a day (BID) | ORAL | Status: DC
  Administered 2021-08-31: 5 mg via ORAL
  Filled 2021-08-31 (×2): qty 1

## 2021-08-31 MED ORDER — IOHEXOL 300 MG/ML  SOLN
100.0000 mL | Freq: Once | INTRAMUSCULAR | Status: AC | PRN
  Administered 2021-08-31: 100 mL via INTRAVENOUS

## 2021-08-31 MED ORDER — ACETAMINOPHEN 500 MG PO TABS
1000.0000 mg | ORAL_TABLET | Freq: Once | ORAL | Status: AC
  Administered 2021-08-31: 1000 mg via ORAL
  Filled 2021-08-31: qty 2

## 2021-08-31 MED ORDER — LACTATED RINGERS IV BOLUS
1000.0000 mL | Freq: Once | INTRAVENOUS | Status: AC
Start: 2021-08-31 — End: 2021-08-31
  Administered 2021-08-31: 1000 mL via INTRAVENOUS

## 2021-08-31 MED ORDER — CEFEPIME HCL 2 G IV SOLR
2.0000 g | Freq: Once | INTRAVENOUS | Status: AC
  Administered 2021-08-31: 2 g via INTRAVENOUS
  Filled 2021-08-31: qty 12.5

## 2021-08-31 NOTE — Assessment & Plan Note (Signed)
Continue with p.o. Keflex and Flagyl.  Would avoid quinolones in this elderly patient.

## 2021-08-31 NOTE — Assessment & Plan Note (Signed)
Continue with Eliquis. ?

## 2021-08-31 NOTE — Subjective & Objective (Signed)
Chief complaint: Lethargy, confusion History of present illness: 87 year old female history of chronic A-fib on Eliquis, hypertension, chronic UTIs presents the ER today with confusion and weakness.  Son states the patient was treated with multiple antibiotics about a week ago for E. coli.  Patient was ultimately placed on Keflex after urine cultures grew out E. coli.  Patient is also been having chronic neck pain for about the last month.  She has been on multiple rounds of steroids which are only partially beneficial.  She is ready had 2 injections of IM steroids by her PCP.  She was sent to orthopedics for consideration of injection.  This was supposed to be today.  Patient never made it to the clinic today due to lethargy.  Patient's been having some slight abdominal pain.  She has been having diarrhea for the last couple days.  Denies any fever.  On arrival temp 98.3 heart rate 106 blood pressure 127/71.  Patient given IV fluids and her heart rate is now dropped to 94.  CT imaging shows uncomplicated sigmoid diverticulitis.  Labs showed a white count of 14.7, hemoglobin of 11.5, platelets of 240  Sodium 133, Tesson 3.3, BUN of 21, creatinine 0.9  UA negative.  Due to confusion and sigmoid diverticulitis, [facility] hospitalist contacted for admission.

## 2021-08-31 NOTE — Assessment & Plan Note (Signed)
Admit to observation telemetry bed.  Unclear the cause of her confusion.  Could be due to her acute infection.  Would avoid any sedatives or opiates at this point.  Continue with gentle IV hydration.

## 2021-08-31 NOTE — Assessment & Plan Note (Signed)
Son states patient's been having cervical neck pain for the last several weeks.  She has not had any advanced imaging.  No red flag symptoms.  Check CT scan of her neck.

## 2021-08-31 NOTE — ED Provider Notes (Signed)
[facility] DEPT Provider Note   CSN: [id number] Arrival date & time: 08/31/21  1444     History Chief Complaint  Patient presents with   Weakness   Neck Pain   lethargic    HPI KAMERIA CANIZARES is a 87 y.o. female presenting for chief complaint of altered mental status.  She has an extensive medical history including history of recurrent urinary tract infections requiring admission recently, A-fib RVR on Eliquis, admission secondary to metabolic disturbances and failure to thrive.  Today, patient's ongoing condition and for progressive altered mental status.  He picked her up to take her to a outpatient orthopedic appointment.  She was too confused to get out of the bed and did not know where she was.  She denies fevers chills nausea vomiting syncope shortness of breath.  She does have diffuse abdominal pain and dysuria.  No known sick contacts Lives at home with his son who provides much of her ADLs..  Patient's recorded medical, surgical, social, medication list and allergies were reviewed in the Snapshot window as part of the initial history.   Review of Systems   Review of Systems  Constitutional:  Positive for fatigue. Negative for chills and fever.  HENT:  Negative for ear pain and sore throat.   Eyes:  Negative for pain and visual disturbance.  Respiratory:  Negative for cough and shortness of breath.   Cardiovascular:  Negative for chest pain and palpitations.  Gastrointestinal:  Negative for abdominal pain and vomiting.  Genitourinary:  Positive for dysuria and urgency. Negative for hematuria.  Musculoskeletal:  Negative for arthralgias and back pain.  Skin:  Negative for color change and rash.  Neurological:  Negative for seizures and syncope.  Psychiatric/Behavioral:  Positive for confusion.   All other systems reviewed and are negative.   Physical Exam Updated Vital Signs BP 104/61   Pulse 94   Temp 98.5 F (36.9 C) (Oral)   Resp 16    Ht '5\' 5"'$  (1.651 m)   Wt 67.1 kg   SpO2 96%   BMI 24.63 kg/m  Physical Exam Vitals and nursing note reviewed.  Constitutional:      General: She is not in acute distress.    Appearance: She is well-developed.  HENT:     Head: Normocephalic and atraumatic.  Eyes:     Conjunctiva/sclera: Conjunctivae normal.  Cardiovascular:     Rate and Rhythm: Normal rate and regular rhythm.     Heart sounds: No murmur heard. Pulmonary:     Effort: Pulmonary effort is normal. No respiratory distress.     Breath sounds: Normal breath sounds.  Abdominal:     General: There is no distension.     Palpations: Abdomen is soft.     Tenderness: There is no abdominal tenderness. There is no right CVA tenderness or left CVA tenderness.  Musculoskeletal:        General: No swelling or tenderness. Normal range of motion.     Cervical back: Neck supple.  Skin:    General: Skin is warm and dry.  Neurological:     General: No focal deficit present.     Mental Status: She is alert and oriented to person, place, and time. Mental status is at baseline.     Cranial Nerves: No cranial nerve deficit.     Motor: Weakness (Diffuse nonfocal weakness. Able to lift all extremeties against gravity.) present.      ED Course/ Medical Decision Making/ A&P  Procedures Procedures   Medications Ordered in ED Medications  potassium chloride 10 mEq in 100 mL IVPB (10 mEq Intravenous New Bag/Given 08/31/21 2204)  apixaban (ELIQUIS) tablet 5 mg (5 mg Oral Given 08/31/21 2204)  lactated ringers bolus 1,000 mL (0 mLs Intravenous Stopped 08/31/21 1814)  vancomycin (VANCOREADY) IVPB 1500 mg/300 mL (0 mg Intravenous Stopped 08/31/21 2040)  ceFEPIme (MAXIPIME) 2 g in sodium chloride 0.9 % 100 mL IVPB (0 g Intravenous Stopped 08/31/21 1836)  potassium chloride 10 mEq in 100 mL IVPB (0 mEq Intravenous Stopped 08/31/21 1942)  acetaminophen (TYLENOL) tablet 1,000 mg (1,000 mg Oral Given 08/31/21 1811)  lactated ringers bolus 500 mL (0  mLs Intravenous Stopped 08/31/21 2142)  iohexol (OMNIPAQUE) 300 MG/ML solution 100 mL (100 mLs Intravenous Contrast Given 08/31/21 2020)    Medical Decision Making:    RODERICA CATHELL is a 87 y.o. female who presented to the ED today with altered mental status detailed above.     Additional history discussed with patient's family/caregivers.  Patient's presentation is complicated by their history of multiple comorbid medical problems including complex outpatient medication regimen.  Patient placed on continuous vitals and telemetry monitoring while in ED which was reviewed periodically.   Complete initial physical exam performed, notably the patient  was initially tachycardic and tachypneic in the setting of altered mental status prompting a code sepsis activation in triage.  By the time she was brought back to the room, patient GCS 14, interactive with no focal neurologic deficits.  She remains tachycardic initially though responding to fluids that were initiated..      Reviewed and confirmed nursing documentation for past medical history, family history, social history.    Initial Assessment:   With the patient's presentation of altered mental status and then presenting vital signs, most likely diagnosis is sepsis of uncertain etiologyThis is most consistent with an acute life/limb threatening illness complicated by underlying chronic conditions.  Initial Plan:  Screening labs including CBC and Metabolic panel to evaluate for infectious or metabolic etiology of disease.  Urinalysis with reflex culture ordered to evaluate for UTI or relevant urologic/nephrologic pathology.  CXR to evaluate for structural/infectious intrathoracic pathology.  CT abdomen pelvis to further evaluate for infectious etiology in the setting of abdominal pain Objective evaluation as below reviewed   Initial Study Results:   Laboratory  All laboratory results reviewed without evidence of clinically relevant pathology.    Mild hypokalemia, leukocytosis EKG EKG was reviewed independently. Rate, rhythm, axis, intervals all examined and without medically relevant abnormality. ST segments without concerns for elevations.     Radiology  All images reviewed independently. Agree with radiology report at this time.    CT ABDOMEN PELVIS W CONTRAST  Final Result    DG Chest Port 1 View  Final Result    CT CERVICAL SPINE WO CONTRAST    (Results Pending)   Final Assessment and Plan:   Patient's objective evaluation is most consistent with developing diverticulitis.  Likely the etiology of his altered mental status.  Patient on broad-spectrum antibiotics arranged for admission to hospitalist.  Patient due to ready move at this time with no further acute events.  Disposition:   Based on the above findings, I believe this patient is stable for admission.       Patient/family educated about specific findings on our evaluation and explained exact reasons for admission.  Patient/family educated about clinical situation and time was allowed to answer questions.   Admission team communicated with and  agreed with need for admission. Patient admitted. Patient ready to move at this time.        Emergency Department Medication Summary:   Medications  potassium chloride 10 mEq in 100 mL IVPB (10 mEq Intravenous New Bag/Given 08/31/21 2204)  apixaban (ELIQUIS) tablet 5 mg (5 mg Oral Given 08/31/21 2204)  lactated ringers bolus 1,000 mL (0 mLs Intravenous Stopped 08/31/21 1814)  vancomycin (VANCOREADY) IVPB 1500 mg/300 mL (0 mg Intravenous Stopped 08/31/21 2040)  ceFEPIme (MAXIPIME) 2 g in sodium chloride 0.9 % 100 mL IVPB (0 g Intravenous Stopped 08/31/21 1836)  potassium chloride 10 mEq in 100 mL IVPB (0 mEq Intravenous Stopped 08/31/21 1942)  acetaminophen (TYLENOL) tablet 1,000 mg (1,000 mg Oral Given 08/31/21 1811)  lactated ringers bolus 500 mL (0 mLs Intravenous Stopped 08/31/21 2142)  iohexol (OMNIPAQUE) 300 MG/ML solution  100 mL (100 mLs Intravenous Contrast Given 08/31/21 2020)         Clinical Impression:  1. Neck pain   2. Generalized weakness         Admit   Final Clinical Impression(s) / ED Diagnoses Final diagnoses:  Neck pain  Generalized weakness    Rx / DC Orders ED Discharge Orders     None         Tretha Sciara, MD 08/31/21 2315

## 2021-08-31 NOTE — Assessment & Plan Note (Signed)
Replete with IV KCl.  Hold HCTZ.  Repeat BMP in the morning.

## 2021-08-31 NOTE — Assessment & Plan Note (Signed)
Continue Coreg for rate control.  Continue Eliquis for CVA prophylaxis.

## 2021-08-31 NOTE — H&P (Signed)
History and Physical    Caitlin Chavez XNT:[id number] DOB: 1935/01/21 DOA: 08/31/2021  DOS: the patient was seen and examined on 08/31/2021  PCP: Sueanne Margarita, DO   Patient coming from: Home  I have personally briefly reviewed patient's old medical records in [facility]  Chief complaint: Lethargy, confusion History of present illness: 87 year old female history of chronic A-fib on Eliquis, hypertension, chronic UTIs presents the ER today with confusion and weakness.  Son states the patient was treated with multiple antibiotics about a week ago for E. coli.  Patient was ultimately placed on Keflex after urine cultures grew out E. coli.  Patient is also been having chronic neck pain for about the last month.  She has been on multiple rounds of steroids which are only partially beneficial.  She is ready had 2 injections of IM steroids by her PCP.  She was sent to orthopedics for consideration of injection.  This was supposed to be today.  Patient never made it to the clinic today due to lethargy.  Patient's been having some slight abdominal pain.  She has been having diarrhea for the last couple days.  Denies any fever.  On arrival temp 98.3 heart rate 106 blood pressure 127/71.  Patient given IV fluids and her heart rate is now dropped to 94.  CT imaging shows uncomplicated sigmoid diverticulitis.  Labs showed a white count of 14.7, hemoglobin of 11.5, platelets of 240  Sodium 133, Tesson 3.3, BUN of 21, creatinine 0.9  UA negative.  Due to confusion and sigmoid diverticulitis, [facility] contacted for admission.   ED Course: CT abdomen pelvis shows sigmoid diverticulitis.  Review of Systems:  Review of Systems  Unable to perform ROS: Other  Due to confusion  Past Medical History:  Diagnosis Date   Anxiety    Arthritis    hands   Carpal tunnel syndrome    Colon polyps    Diverticulosis    FUO (fever of unknown origin) 10/31/2018   GERD (gastroesophageal  reflux disease)    Hiatal hernia    Hypertension    Macular degeneration    Peripheral neuropathy     Past Surgical History:  Procedure Laterality Date   CATARACT EXTRACTION, BILATERAL     CHOLECYSTECTOMY     COLONOSCOPY     LASIK     POLYPECTOMY     VAGINAL HYSTERECTOMY       reports that she has never smoked. She has never used smokeless tobacco. She reports that she does not drink alcohol and does not use drugs.  Allergies  Allergen Reactions   Alka-Seltzer [Aspirin Effervescent] Anaphylaxis and Hives   Beef-Derived Products Anaphylaxis   Nsaids Anaphylaxis and Hives   Other Anaphylaxis and Rash   Pork-Derived Products Anaphylaxis   Codeine Hives and Nausea And Vomiting    Family History  Problem Relation Age of Onset   Heart attack Father 44   Alcohol abuse Father    Heart failure Mother 22       chf   Heart disease Mother    Stomach cancer Maternal Grandmother    Prostate cancer Son    Colon cancer Neg Hx     Prior to Admission medications   Medication Sig Start Date End Date Taking? Authorizing Provider  allopurinol (ZYLOPRIM) 100 MG tablet Take 100 mg by mouth 2 (two) times daily.   Yes [provider]  ALPRAZolam Duanne Moron) 0.5 MG tablet Take 0.5 mg by mouth 2 (two) times daily as  needed for anxiety.   Yes [provider]  apixaban (ELIQUIS) 5 MG TABS tablet Take 1 tablet (5 mg total) by mouth 2 (two) times daily. 12/15/18  Yes Harold Hedge, MD  carvedilol (COREG) 6.25 MG tablet Take 6.25 mg by mouth 2 (two) times daily with a meal.  07/11/15  Yes [provider]  cholecalciferol (VITAMIN D) 25 MCG (1000 UT) tablet Take 2,000 Units by mouth daily.   Yes [provider]  EPINEPHrine 0.3 mg/0.3 mL IJ SOAJ injection Inject 0.3 mg into the muscle as needed. 01/14/20  Yes Garnet Sierras, DO  gabapentin (NEURONTIN) 300 MG capsule Take 300 mg by mouth 2 (two) times daily. 07/11/15  Yes [provider]  hydrochlorothiazide  (MICROZIDE) 12.5 MG capsule Take 12.5 mg by mouth daily.   Yes [provider]  lidocaine (LIDODERM) 5 % Place 1 patch onto the skin daily as needed (For neck pain).   Yes [provider]  Menthol, Topical Analgesic, (BIOFREEZE) 4 % GEL Apply 1 application  topically daily as needed (For knee pain).   Yes [provider]  Multiple Vitamins-Minerals (OCUVITE PO) Take 1 tablet by mouth daily.    Yes [provider]  omeprazole (PRILOSEC) 40 MG capsule Take 40 mg by mouth daily. 11/18/19  Yes [provider]  Potassium 99 MG TABS Take 1 tablet by mouth 2 (two) times daily.   Yes [provider]  Probiotic Product (PROBIOTIC PO) Take 1 tablet by mouth daily.   Yes [provider]  primidone (MYSOLINE) 50 MG tablet 3 in AM, 2 in PM Patient not taking: Reported on 08/31/2021 05/30/18   TatEustace Quail, DO    Physical Exam: Vitals:   08/31/21 2015 08/31/21 2045 08/31/21 2100 08/31/21 2107  BP:    104/61  Pulse: 95 98 88 94  Resp: 15 15 (!) 21 16  Temp:      TempSrc:      SpO2: 96% 96% 95% 96%  Weight:      Height:        Physical Exam Vitals and nursing note reviewed.  Constitutional:      General: She is not in acute distress.    Appearance: Normal appearance. She is not ill-appearing, toxic-appearing or diaphoretic.  HENT:     Head: Normocephalic and atraumatic.     Nose: Nose normal.  Cardiovascular:     Rate and Rhythm: Normal rate. Rhythm irregular.     Pulses: Normal pulses.  Pulmonary:     Effort: Pulmonary effort is normal. No respiratory distress.     Breath sounds: No wheezing or rales.  Abdominal:     General: Bowel sounds are normal. There is no distension.     Palpations: Abdomen is soft.     Tenderness: There is abdominal tenderness in the left lower quadrant. There is no guarding or rebound.  Musculoskeletal:     Right lower leg: No edema.     Left lower leg: No edema.  Skin:    General: Skin is warm and  dry.  Neurological:     General: No focal deficit present.     Mental Status: She is disoriented.      Labs on Admission: I have personally reviewed following labs and imaging studies  CBC: Recent Labs  Lab 08/31/21 1602  WBC 14.7*  HGB 11.5*  HCT 34.4*  MCV 94.8  PLT 774   Basic Metabolic Panel: Recent Labs  Lab 08/31/21 1603  NA 133*  K 3.3*  CL 94*  CO2 24  GLUCOSE 107*  BUN 21  CREATININE 0.99  CALCIUM 8.6*  MG 1.8   GFR: Estimated Creatinine Clearance: 37.4 mL/min (by C-G formula based on SCr of 0.99 mg/dL). Liver Function Tests: Recent Labs  Lab 08/31/21 1603  AST 27  ALT 39  ALKPHOS 74  BILITOT 2.0*  PROT 6.8  ALBUMIN 2.8*   No results for input(s): "LIPASE", "AMYLASE" in the last 168 hours. No results for input(s): "AMMONIA" in the last 168 hours. Coagulation Profile: Recent Labs  Lab 08/31/21 1609  INR 2.0*   Cardiac Enzymes: No results for input(s): "CKTOTAL", "CKMB", "CKMBINDEX", "TROPONINI", "TROPONINIHS" in the last 168 hours. BNP (last 3 results) No results for input(s): "PROBNP" in the last 8760 hours. HbA1C: No results for input(s): "HGBA1C" in the last 72 hours. CBG: Recent Labs  Lab 08/31/21 1501  GLUCAP 92   Lipid Profile: No results for input(s): "CHOL", "HDL", "LDLCALC", "TRIG", "CHOLHDL", "LDLDIRECT" in the last 72 hours. Thyroid Function Tests: No results for input(s): "TSH", "T4TOTAL", "FREET4", "T3FREE", "THYROIDAB" in the last 72 hours. Anemia Panel: No results for input(s): "VITAMINB12", "FOLATE", "FERRITIN", "TIBC", "IRON", "RETICCTPCT" in the last 72 hours. Urine analysis:    Component Value Date/Time   COLORURINE YELLOW 08/31/2021 1828   APPEARANCEUR CLEAR 08/31/2021 1828   LABSPEC 1.016 08/31/2021 1828   PHURINE 6.0 08/31/2021 1828   GLUCOSEU NEGATIVE 08/31/2021 1828   HGBUR NEGATIVE 08/31/2021 1828   BILIRUBINUR NEGATIVE 08/31/2021 1828   KETONESUR 20 (A) 08/31/2021 1828   PROTEINUR NEGATIVE 08/31/2021  1828   NITRITE NEGATIVE 08/31/2021 1828   LEUKOCYTESUR NEGATIVE 08/31/2021 1828    Radiological Exams on Admission: I have personally reviewed images CT ABDOMEN PELVIS W CONTRAST  Result Date: 08/31/2021 CLINICAL DATA:  Abdominal pain. EXAM: CT ABDOMEN AND PELVIS WITH CONTRAST TECHNIQUE: Multidetector CT imaging of the abdomen and pelvis was performed using the standard protocol following bolus administration of intravenous contrast. RADIATION DOSE REDUCTION: This exam was performed according to the departmental dose-optimization program which includes automated exposure control, adjustment of the mA and/or kV according to patient size and/or use of iterative reconstruction technique. CONTRAST:  1109m OMNIPAQUE IOHEXOL 300 MG/ML  SOLN COMPARISON:  October 28, 2018 FINDINGS: Lower chest: Mild atelectasis is seen within the bilateral lung bases. A small pericardial effusion is seen. Hepatobiliary: No focal liver abnormality is seen. Status post cholecystectomy. No biliary dilatation. Pancreas: Unremarkable. No pancreatic ductal dilatation or surrounding inflammatory changes. Spleen: Normal in size without focal abnormality. Adrenals/Urinary Tract: Adrenal glands are unremarkable. Kidneys are normal in size, without renal calculi or hydronephrosis. An 11 mm diameter simple cyst is seen within the anterior aspect of the mid right kidney. No additional follow-up or imaging is recommended. Bladder is unremarkable. Stomach/Bowel: There is a large hiatal hernia. Appendix appears normal. No evidence of bowel dilatation. Noninflamed diverticula are seen throughout the large bowel. Mildly thickened and inflamed distal descending and proximal sigmoid colon is also noted. Vascular/Lymphatic: Aortic atherosclerosis. No enlarged abdominal or pelvic lymph nodes. Reproductive: Status post hysterectomy. No adnexal masses. Other: A 3.3 cm x 2.5 cm fat containing umbilical hernia is seen. No abdominopelvic ascites.  Musculoskeletal: Multilevel degenerative changes are seen throughout the lumbar spine. IMPRESSION: 1. Mild acute diverticulitis involving the distal descending and proximal sigmoid colon. 2. Evidence of prior cholecystectomy and hysterectomy. 3. Aortic atherosclerosis. Aortic Atherosclerosis (ICD10-I70.0). Electronically Signed   By: TVirgina NorfolkM.D.   On: 08/31/2021 20:42  DG Chest Port 1 View  Result Date: 08/31/2021 CLINICAL DATA:  Questionable sepsis. EXAM: PORTABLE CHEST 1 VIEW COMPARISON:  Chest x-ray 12/09/2018.  Chest CT 08/12/2015 FINDINGS: The heart size and mediastinal contours are within normal limits. Both lungs are clear. The visualized skeletal structures are unremarkable. Large hiatal hernia is unchanged. IMPRESSION: No active disease. Electronically Signed   By: Ronney Asters M.D.   On: 08/31/2021 16:57    EKG: My personal interpretation of EKG shows: afib    Assessment/Plan Principal Problem:   Acute metabolic encephalopathy Active Problems:   Generalized weakness   Essential hypertension   Chronic a-fib (HCC)   Sigmoid diverticulitis   Chronic anticoagulation - on eliquis for afib   Cervical pain (neck)   Hypokalemia    Assessment and Plan: * Acute metabolic encephalopathy Admit to observation telemetry bed.  Unclear the cause of her confusion.  Could be due to her acute infection.  Would avoid any sedatives or opiates at this point.  Continue with gentle IV hydration.  Essential hypertension Stable.  Given her recent low blood pressures, we will hold her HCTZ.  Continue her Coreg for her A-fib.  Generalized weakness Patient is independent at home.  She has a son that lives 3 to 4 miles away that sees her every day.  Her son Barbaraann Rondo who is with her today lives in [place].  Hypokalemia Replete with IV KCl.  Hold HCTZ.  Repeat BMP in the morning.  Cervical pain (neck) Son states patient's been having cervical neck pain for the last several weeks.  She  has not had any advanced imaging.  No red flag symptoms.  Check CT scan of her neck.  Chronic anticoagulation - on eliquis for afib Continue with Eliquis.  Sigmoid diverticulitis Continue with p.o. Keflex and Flagyl.  Would avoid quinolones in this elderly patient.  Chronic a-fib ([place]) Continue Coreg for rate control.  Continue Eliquis for CVA prophylaxis.   DVT prophylaxis: Eliquis Code Status: Full Code Family Communication: discussed with pt's son Barbaraann Rondo at bedside  Disposition Plan: return home  Consults called: none  Admission status: Observation, Telemetry bed   Kristopher Oppenheim, DO [facility] 08/31/2021, 9:49 PM

## 2021-08-31 NOTE — Discharge Instructions (Addendum)
You were seen today for leg weakness.  It does not appear that you are having a stroke at this time.  This may be related to your known arthritis.  I recommend you follow-up closely with your orthopedic team for close outpatient care and management.  Plan and next steps:   The following may be helpful in managing your symptoms:   Lidocaine Patches  Apply to affected area for up to 12 hours at a time.   Adult Tylenol dosing:  650 mg orally every 4 to 6 hours as needed, MAX: 3250 mg/24 hours   (Extra-strength) 1000 mg orally every 6 hours as needed; MAX: 3000 mg/24 hours   Do not use if you have liver disease. Read the label on the bottle.   Adult Ibuprofen Dosing  200 to 400 mg orally every 4 to 6 hours as needed; MAX 1200 mg/day; do not take longer than 10 days   Do not use if you have kidney disease. Read the label on the bottle   Findings:  You may see all of your lab and imaging results utilizing our online portal! Look in this document or ask a team member for your mychart* access information. The most notable results have additionally been verbally communicated with you and your bedside family.    Follow-up Plan:   Follow up with the patient's normal primary care provider for monitoring of this condition within 48 hours.   Signs/Symptoms that would warrant return to the ED:  Please return to the ED if you experience worsening of symptoms or any abrupt changes in your health. Standard of care precautions for your chief complaint have already been verbally communicated with you. Always be on alert for fevers, chills, shortness of breath, chest pains, or sudden changes that warrant immediate evaluation.    Thank you for allowing [place] to be a part of you and your families' care.   [facility] Stay Proper nutrition can help your body recover from illness and injury.   Foods and beverages high in protein, vitamins, and minerals help rebuild muscle  loss, promote healing, & reduce fall risk.   In addition to eating healthy foods, a nutrition shake is an easy, delicious way to get the nutrition you need during and after your hospital stay  It is recommended that you continue to drink 2 bottles per day of: Ensure Complete or similar for at least 1 month (30 days) after your hospital stay   Tips for adding a nutrition shake into your routine: As allowed, drink one with vitamins or medications instead of water or juice Enjoy one as a tasty mid-morning or afternoon snack Drink cold or make a milkshake out of it Drink one instead of milk with cereal or snacks Use as a coffee creamer   Available at the following grocery stores and pharmacies:           * [place] [phone]            For COUPONS visit: www.ensure.com/join or [email or web address]-[email or web address]   Suggested Substitutions Ensure Plus = Boost Plus = Carnation Breakfast Essentials = Boost Compact Ensure Active Clear = Boost Breeze Glucerna  Shake = Boost Glucose Control = Carnation Breakfast Essentials SUGAR FREE    High Calorie, High Protein Nutrition Therapy  A high-calorie, high-protein diet has been recommended to you. Your registered dietitian nutritionist (RDN) may have recommended this diet because you are having difficulty eating enough calories throughout the day, you have lost weight, and/or you need to add protein to your diet.  Sometimes you may not feel like eating, even if you know the importance of good nutrition. The recommendations in this handout can help you with the following:  Regaining your strength and energy Keeping your body healthy Healing and recovering from surgery or illness and fighting infection  Schedule Your Meals and Snacks  Several small meals and snacks are often better  tolerated and digested than large meals.  Strategies  Plan to eat 3 meals and 3 snacks daily. Experiment with timing meals to find out when you have a larger appetite. Appetite may be greatest in the morning after not eating all night so you may prefer to eat your larger meals and snacks in the morning and at lunch. Breakfast-type foods are often better tolerated so eat foods such as eggs, pancakes, waffles and cereal for any meal or snack. Carry snacks with you so you are prepared to eat every 2 to 3 hours. Determine what works best for you if your body's cues for feeling hungry or full are not working. Eat a small meal or snack even if you don't feel hungry. Set a timer to remind you when it is time to eat. Take a walk before you eat (with health care provider's approval). Light or moderate physical activity can help you maintain muscle and increase your appetite.  Make Eating Enjoyable  Taking steps to make the experience enjoyable may help to increase your interest in eating and improve your appetite.  Strategies:  Eat with others whenever possible. Include your favorite foods to make meals more enjoyable. Try new foods. Save your beverage for the end of the meal so that you have more room for food before you get full.  Add Calories to Your Meals and Snacks  Try adding calorie-dense foods so that each bite provides more nutrition.  Strategies  Drink milk, chocolate milk, soy milk, or smoothies instead of low-calorie beverages such as diet drinks or water. Cook with milk or soy milk instead of water when making dishes such as hot cereal, cocoa, or pudding. Add jelly, jam, honey, butter or margarine to bread and crackers. Add jam or fruit to ice cream and as a topping over cake. Mix dried fruit, nuts, granola, honey, or dry cereal with yogurt or hot cereals. Enjoy snacks such as milkshakes, smoothies, pudding, ice cream, or custard. Blend a fruit smoothie of a banana, frozen  berries, milk or soy milk, and 1 tablespoon nonfat powdered milk or protein powder.  Add Protein to Your Meals and Snacks  Choose at least one protein food at each meal and snack to increase your daily intake.  Strategies  Add  cup nonfat dry milk powder or protein powder to make a high-protein milk to drink or to use in recipes that call for milk. Vanilla or peppermint extract or unsweetened cocoa powder could help to boost the flavor. Add hard-cooked eggs, leftover meat, grated cheese, canned beans or tofu to noodles, rice, salads, sandwiches, soups, casseroles, pasta, tuna and other mixed dishes. Add powdered milk or protein powder to hot cereals, meatloaf, casseroles, scrambled eggs, sauces, cream soups, and shakes. Add beans  and lentils to salads, soups, casseroles, and vegetable dishes. Eat cottage cheese or yogurt, especially Greek yogurt, with fruit as a snack or dessert. Eat peanut or other nut butters on crackers, bread, toast, waffles, apples, bananas or celery sticks. Add it to milkshakes, smoothies, or desserts. Consider a ready-made protein shake.   Add Fats to Your Meals and Snacks  Try adding fats to your meals and snacks. Fat provides more calories in fewer bites than carbohydrate or protein and adds flavors to your foods.  Strategies  Snack on nuts and seeds or add them to foods like salads, pasta, cereals, yogurt, and ice cream.  Saut or stir-fry vegetables, meats, chicken, fish or tofu in olive or canola oil.  Add olive oil, other vegetable oils, butter or margarine to soups, vegetables, potatoes, cooked cereal, rice, pasta, bread, crackers, pancakes, or waffles. Snack on olives or add to pasta, pizza, or salad. Add avocado or guacamole to your salads, sandwiches, and other entrees. Include fatty fish such as salmon in your weekly meal plan.  Tips For Adding Protein Nutrition Therapy    Tips Add extra egg to one or more meals  Increase the portion of milk to  drink and change to skim milk if able  Include [place] yogurt or cottage cheese for snack or part of a meal  Increase portion size of protein entre and decrease portion of starch/bread  Mix protein powder, nut butter, almond/nut milk, non-fat dry milk, or Greek yogurt to shakes and smoothies  Use these ingredients also in baked goods or other recipes Use double the amount of sandwich filling  Add protein foods to all snacks including cheese, nut butters, milk and yogurt  Food Tips for Including Protein  Beans Cook and use dried peas, beans, and tofu in soups or add to casseroles, pastas, and grain dishes that also contain cheese or meat  Mash with cheese and milk  Use tofu to make smoothies  Commercial Protein Supplements Use nutritional supplements or protein powder sold at pharmacies and grocery stores  Use protein powder in milk drinks and desserts, such as pudding  Mix with ice cream, milk, and fruit or other flavorings for a high-protein milkshake  Cottage Cheese or [employer] Mix with or use to stuff fruits and vegetables  Add to casseroles, spaghetti, noodles, or egg dishes such as omelets, scrambled eggs, and souffls  Use gelatin, pudding-type desserts, cheesecake, and pancake or waffle batter  Use to stuff crepes, pasta shells, or manicotti  Puree and use as a substitute for sour cream  Eggs, Egg whites, and Egg Yolks Add chopped, hard-cooked eggs to salads and dressings, vegetables, casseroles, and creamed meats  Beat eggs into mashed potatoes, vegetable purees, and sauces  Add extra egg whites to quiches, scrambled eggs, custards, puddings, pancake batter, or [place] toast wash/batter  Make a rich custard with egg yolks, double strength milk, and sugar  Add extra hard-cooked yolks to deviled egg filling and sandwich spreads  Hard or Semi-Soft Cheese (Cheddar, Barnabas Lister, [place]) Melt on sandwiches, bread, muffins, tortillas, hamburgers, hot dogs, other meats or fish, vegetables, eggs,  or desserts such as stewed figs or pies  Grate and add to soups, sauces, casseroles, vegetable dishes, potatoes, rice noodles, or meatloaf  Serve as a snack with crackers or bagels  Ice cream, Yogurt, and Frozen Yogurt Add to milk drinks such as milkshakes  Add to cereals, fruits, gelatin desserts, and pies  Blend or whip with soft or cooked fruits  Sandwich ice  cream or frozen yogurt between enriched cake slices, cookies, or graham crackers  Use seasoned yogurt as a dip for fruits, vegetables, or chips  Use yogurt in place of sour cream in casseroles  Meat and Fish Add chopped, cooked meat or fish to vegetables, salads, casseroles, soups, sauces, and biscuit dough  Use in omelets, souffls, quiches, and sandwich fillings  Add chicken and [place] to stuffing  Wrap in pie crust or biscuit dough as turnovers  Add to stuffed baked potatoes  Add pureed meat to soups  Milk Use in beverages and in cooking  Use in preparing foods, such as hot cereal, soups, cocoa, or pudding  Add cream sauces to vegetable and other dishes  Use evaporated milk, evaporated skim milk, or sweetened condensed milk instead of milk or water in recipes.  Nonfat Dry Milk Add 1/3 cup of nonfat dry milk powdered milk to each cup of regular milk for "double strength" milk  Add to yogurt and milk drinks, such as pasteurized eggnog and milkshakes  Add to scrambled eggs and mashed potatoes  Use in casseroles, meatloaf, hot cereal, breads, muffins, sauces, cream soups, puddings and custards, and other milk-based desserts  Nuts, Seeds, and Wheat Germ Add to casseroles, breads, muffins, pancakes, cookies, and waffles  Sprinkle on fruit, cereal, ice cream, yogurt, vegetables, salads, and toast as a crunchy topping  Use in place of breadcrumbs  Blend with parsley or spinach, herbs, and cream for a noodle, pasta, or vegetable sauce.  Roll banana in chopped nuts  Peanut Butter Spread on sandwiches, toast, muffins, crackers, waffles,  pancakes, and fruit slices  Use as a dip for raw vegetables, such as carrots, cauliflower, and celery  Blend with milk drinks, smoothies, and other beverages  Swirl through soft ice cream or yogurt  Spread on a banana then roll in crushed, dry cereal or chopped nuts   Small Meal and Snack Ideas  These snacks and meals are recommended when you have to eat but aren't necessarily hungry.  They are good choices because they are high in protein and high in calories.   2 graham crackers, 2 tablespoons peanut or other nut butter, 1 cup milk  cup [place] yogurt,  cup fruit,  cup granola 2 deviled egg halves, 5 whole wheat crackers 1 cup cream of tomato soup,  grilled cheese sandwich 1 toasted waffle topped with: 2 tablespoons peanut or nut butter, 1 tablespoon jam Trail mix made with:  cup nuts,  cup dried fruit,  cup cold cereal, any variety  cup oatmeal or cream of wheat cereal, 1 tablespoon peanut or nut butter,  cup diced fruit  High-Calorie, High-Protein Sample 1-Day Menu   Breakfast 1 egg, scrambled 1 ounce cheddar cheese 1 English muffin, whole wheat 1 tablespoon margarine 1 tablespoon jam  cup orange juice, fortified with calcium and vitamin D  Morning Snack 1 tablespoon peanut butter 1 banana 1 cup 1% milk  Lunch Tuna salad sandwich made with: 2 slices bread, whole wheat 3 ounces tuna mixed with: 1 tablespoon mayonnaise  cup pudding  Afternoon Snack  cup hummus  cup carrots 1 pita  Evening Meal Enchilada casserole made with: 2 corn tortillas 3 ounces ground beef, cooked  cup black beans, cooked  cup corn, cooked 1 ounce grated cheddar cheese  cup enchilada sauce  avocado, sliced, topping for enchilada 1 tablespoon sour cream, topping for enchilada Salad:  cup lettuce, shredded  cup tomatoes, chopped, for salad 1 tablespoon olive oil and vinegar dressing, for  salad  Evening Snack  cup Greek yogurt  cup blueberries  cup  granola  Copyright 2020  Academy of [employer] Dietetics. All rights reserved

## 2021-08-31 NOTE — Assessment & Plan Note (Signed)
Stable.  Given her recent low blood pressures, we will hold her HCTZ.  Continue her Coreg for her A-fib.

## 2021-08-31 NOTE — Assessment & Plan Note (Signed)
Patient is independent at home.  She has a son that lives 3 to 4 miles away that sees her every day.  Her son Barbaraann Rondo who is with her today lives in [place].

## 2021-08-31 NOTE — ED Triage Notes (Signed)
Patient's son reports that he arrived at the patient's home today to take her to Ortho to receive a cortisone injection in her right neck. Son states that the patient has been on 2 rounds of prednisone, muscle relaxants and not getting any better. The son states that the patient was lethargic and weak when he picked her up today.

## 2021-08-31 NOTE — ED Provider Triage Note (Signed)
Emergency Medicine Provider Triage Evaluation Note  Caitlin Chavez , a 87 y.o. female  was evaluated in triage.  Pt complains of neck pain. She is altered this morning which is not of baseline per son. He went to pick her up for a cortisone injection appointment at the orthopedic office when he said she was lethargic and weak. Son reports he talked to her on the phone last night around 2100 and she seemed lucid. Patient lives by herself. No recent injections.   Review of Systems  Positive:  Negative:   Physical Exam  BP 127/71 (BP Location: Right Arm)   Pulse (!) 106   Temp 98.3 F (36.8 C) (Oral)   Resp 18   Ht '5\' 5"'$  (1.651 m)   Wt 67.1 kg   SpO2 92%   BMI 24.63 kg/m  Gen:   Awake, patient feels warm Resp:  Normal effort  MSK:   Moves extremities without difficulty  Other:  Diffuse neck pain  Medical Decision Making  Medically screening exam initiated at 3:50 PM.  Appropriate orders placed.  Caitlin Chavez was informed that the remainder of the evaluation will be completed by another provider, this initial triage assessment does not replace that evaluation, and the importance of remaining in the ED until their evaluation is complete.  Concerned for a fever. Patient is tachycardic. Concern for sepsis. Pt has h/o sepsis from UTI. Evolvign sepsis orders placed. Nursing aware that patient is next to room.    Caitlin Chavez, [place] 08/31/21 1557

## 2021-09-01 ENCOUNTER — Encounter (HOSPITAL_COMMUNITY): Payer: Self-pay | Admitting: Internal Medicine

## 2021-09-01 ENCOUNTER — Observation Stay (HOSPITAL_COMMUNITY): Payer: Medicare Other

## 2021-09-01 DIAGNOSIS — R627 Adult failure to thrive: Secondary | ICD-10-CM | POA: Diagnosis present

## 2021-09-01 DIAGNOSIS — R197 Diarrhea, unspecified: Secondary | ICD-10-CM | POA: Diagnosis not present

## 2021-09-01 DIAGNOSIS — K625 Hemorrhage of anus and rectum: Secondary | ICD-10-CM | POA: Diagnosis not present

## 2021-09-01 DIAGNOSIS — K922 Gastrointestinal hemorrhage, unspecified: Secondary | ICD-10-CM | POA: Diagnosis present

## 2021-09-01 DIAGNOSIS — I5032 Chronic diastolic (congestive) heart failure: Secondary | ICD-10-CM | POA: Diagnosis present

## 2021-09-01 DIAGNOSIS — I482 Chronic atrial fibrillation, unspecified: Secondary | ICD-10-CM | POA: Diagnosis present

## 2021-09-01 DIAGNOSIS — F32A Depression, unspecified: Secondary | ICD-10-CM | POA: Diagnosis present

## 2021-09-01 DIAGNOSIS — J9811 Atelectasis: Secondary | ICD-10-CM | POA: Diagnosis present

## 2021-09-01 DIAGNOSIS — E8809 Other disorders of plasma-protein metabolism, not elsewhere classified: Secondary | ICD-10-CM | POA: Diagnosis present

## 2021-09-01 DIAGNOSIS — M542 Cervicalgia: Secondary | ICD-10-CM | POA: Diagnosis present

## 2021-09-01 DIAGNOSIS — K5793 Diverticulitis of intestine, part unspecified, without perforation or abscess with bleeding: Secondary | ICD-10-CM | POA: Diagnosis not present

## 2021-09-01 DIAGNOSIS — Z7901 Long term (current) use of anticoagulants: Secondary | ICD-10-CM | POA: Diagnosis not present

## 2021-09-01 DIAGNOSIS — E222 Syndrome of inappropriate secretion of antidiuretic hormone: Secondary | ICD-10-CM | POA: Diagnosis present

## 2021-09-01 DIAGNOSIS — K8689 Other specified diseases of pancreas: Secondary | ICD-10-CM | POA: Diagnosis not present

## 2021-09-01 DIAGNOSIS — K567 Ileus, unspecified: Secondary | ICD-10-CM | POA: Diagnosis not present

## 2021-09-01 DIAGNOSIS — K862 Cyst of pancreas: Secondary | ICD-10-CM | POA: Diagnosis present

## 2021-09-01 DIAGNOSIS — E860 Dehydration: Secondary | ICD-10-CM | POA: Diagnosis present

## 2021-09-01 DIAGNOSIS — G9341 Metabolic encephalopathy: Secondary | ICD-10-CM | POA: Diagnosis present

## 2021-09-01 DIAGNOSIS — E669 Obesity, unspecified: Secondary | ICD-10-CM | POA: Diagnosis present

## 2021-09-01 DIAGNOSIS — R935 Abnormal findings on diagnostic imaging of other abdominal regions, including retroperitoneum: Secondary | ICD-10-CM | POA: Diagnosis not present

## 2021-09-01 DIAGNOSIS — R339 Retention of urine, unspecified: Secondary | ICD-10-CM | POA: Diagnosis not present

## 2021-09-01 DIAGNOSIS — I472 Ventricular tachycardia, unspecified: Secondary | ICD-10-CM | POA: Diagnosis present

## 2021-09-01 DIAGNOSIS — I480 Paroxysmal atrial fibrillation: Secondary | ICD-10-CM | POA: Diagnosis present

## 2021-09-01 DIAGNOSIS — I3139 Other pericardial effusion (noninflammatory): Secondary | ICD-10-CM | POA: Diagnosis not present

## 2021-09-01 DIAGNOSIS — K5732 Diverticulitis of large intestine without perforation or abscess without bleeding: Secondary | ICD-10-CM | POA: Diagnosis present

## 2021-09-01 DIAGNOSIS — I739 Peripheral vascular disease, unspecified: Secondary | ICD-10-CM | POA: Diagnosis not present

## 2021-09-01 DIAGNOSIS — I110 Hypertensive heart disease with heart failure: Secondary | ICD-10-CM | POA: Diagnosis present

## 2021-09-01 DIAGNOSIS — D6869 Other thrombophilia: Secondary | ICD-10-CM | POA: Diagnosis present

## 2021-09-01 DIAGNOSIS — D62 Acute posthemorrhagic anemia: Secondary | ICD-10-CM | POA: Diagnosis present

## 2021-09-01 DIAGNOSIS — Z66 Do not resuscitate: Secondary | ICD-10-CM | POA: Diagnosis not present

## 2021-09-01 DIAGNOSIS — A419 Sepsis, unspecified organism: Secondary | ICD-10-CM | POA: Diagnosis present

## 2021-09-01 DIAGNOSIS — K921 Melena: Secondary | ICD-10-CM | POA: Diagnosis not present

## 2021-09-01 DIAGNOSIS — R531 Weakness: Secondary | ICD-10-CM | POA: Diagnosis not present

## 2021-09-01 LAB — CBC WITH DIFFERENTIAL/PLATELET
Abs Immature Granulocytes: 0.09 10*3/uL — ABNORMAL HIGH (ref 0.00–0.07)
Basophils Absolute: 0 10*3/uL (ref 0.0–0.1)
Basophils Relative: 0 %
Eosinophils Absolute: 0 10*3/uL (ref 0.0–0.5)
Eosinophils Relative: 0 %
HCT: 30.3 % — ABNORMAL LOW (ref 36.0–46.0)
Hemoglobin: 9.8 g/dL — ABNORMAL LOW (ref 12.0–15.0)
Immature Granulocytes: 1 %
Lymphocytes Relative: 7 %
Lymphs Abs: 0.7 10*3/uL (ref 0.7–4.0)
MCH: 31.7 pg (ref 26.0–34.0)
MCHC: 32.3 g/dL (ref 30.0–36.0)
MCV: 98.1 fL (ref 80.0–100.0)
Monocytes Absolute: 0.8 10*3/uL (ref 0.1–1.0)
Monocytes Relative: 8 %
Neutro Abs: 9.1 10*3/uL — ABNORMAL HIGH (ref 1.7–7.7)
Neutrophils Relative %: 84 %
Platelets: 166 10*3/uL (ref 150–400)
RBC: 3.09 MIL/uL — ABNORMAL LOW (ref 3.87–5.11)
RDW: 14.1 % (ref 11.5–15.5)
WBC: 10.7 10*3/uL — ABNORMAL HIGH (ref 4.0–10.5)
nRBC: 0 % (ref 0.0–0.2)

## 2021-09-01 LAB — ABO/RH: ABO/RH(D): A POS

## 2021-09-01 LAB — COMPREHENSIVE METABOLIC PANEL
ALT: 31 U/L (ref 0–44)
AST: 22 U/L (ref 15–41)
Albumin: 2.2 g/dL — ABNORMAL LOW (ref 3.5–5.0)
Alkaline Phosphatase: 54 U/L (ref 38–126)
Anion gap: 11 (ref 5–15)
BUN: 18 mg/dL (ref 8–23)
CO2: 20 mmol/L — ABNORMAL LOW (ref 22–32)
Calcium: 7.9 mg/dL — ABNORMAL LOW (ref 8.9–10.3)
Chloride: 100 mmol/L (ref 98–111)
Creatinine, Ser: 0.88 mg/dL (ref 0.44–1.00)
GFR, Estimated: >60 mL/min (ref >60)
Glucose, Bld: 83 mg/dL (ref 70–99)
Potassium: 3.5 mmol/L (ref 3.5–5.1)
Sodium: 131 mmol/L — ABNORMAL LOW (ref 135–145)
Total Bilirubin: 1.5 mg/dL — ABNORMAL HIGH (ref 0.3–1.2)
Total Protein: 5.2 g/dL — ABNORMAL LOW (ref 6.5–8.1)

## 2021-09-01 LAB — PROTIME-INR
INR: 2 — ABNORMAL HIGH (ref 0.8–1.2)
Prothrombin Time: 22.5 seconds — ABNORMAL HIGH (ref 11.4–15.2)

## 2021-09-01 LAB — HEMOGLOBIN AND HEMATOCRIT, BLOOD
HCT: 29.3 % — ABNORMAL LOW (ref 36.0–46.0)
Hemoglobin: 9.6 g/dL — ABNORMAL LOW (ref 12.0–15.0)

## 2021-09-01 LAB — APTT: aPTT: 43 seconds — ABNORMAL HIGH (ref 24–36)

## 2021-09-01 LAB — TYPE AND SCREEN
ABO/RH(D): A POS
Antibody Screen: NEGATIVE

## 2021-09-01 LAB — MAGNESIUM: Magnesium: 1.6 mg/dL — ABNORMAL LOW (ref 1.7–2.4)

## 2021-09-01 MED ORDER — DEXTROSE IN LACTATED RINGERS 5 % IV SOLN
INTRAVENOUS | Status: DC
Start: 2021-09-01 — End: 2021-09-02

## 2021-09-01 MED ORDER — HYDROCODONE-ACETAMINOPHEN 5-325 MG PO TABS
0.5000 | ORAL_TABLET | ORAL | Status: DC | PRN
  Administered 2021-09-01 – 2021-09-11 (×29): 0.5 via ORAL
  Filled 2021-09-01 (×30): qty 1

## 2021-09-01 MED ORDER — MELATONIN 3 MG PO TABS
3.0000 mg | ORAL_TABLET | Freq: Every day | ORAL | Status: DC
  Administered 2021-09-01 – 2021-09-12 (×12): 3 mg via ORAL
  Filled 2021-09-01 (×12): qty 1

## 2021-09-01 MED ORDER — LIDOCAINE 5 % EX PTCH
1.0000 | MEDICATED_PATCH | CUTANEOUS | Status: DC
  Administered 2021-09-01 – 2021-09-13 (×13): 1 via TRANSDERMAL
  Filled 2021-09-01 (×14): qty 1

## 2021-09-01 MED ORDER — HYDROCODONE-ACETAMINOPHEN 5-325 MG PO TABS
0.5000 | ORAL_TABLET | Freq: Every day | ORAL | Status: AC
  Filled 2021-09-01: qty 1

## 2021-09-01 MED ORDER — ACETAMINOPHEN 325 MG PO TABS
650.0000 mg | ORAL_TABLET | Freq: Four times a day (QID) | ORAL | Status: DC | PRN
  Administered 2021-09-02 – 2021-09-12 (×7): 650 mg via ORAL
  Filled 2021-09-01 (×7): qty 2

## 2021-09-01 MED ORDER — PANTOPRAZOLE SODIUM 40 MG IV SOLR
40.0000 mg | Freq: Two times a day (BID) | INTRAVENOUS | Status: DC
  Administered 2021-09-01 – 2021-09-08 (×15): 40 mg via INTRAVENOUS
  Filled 2021-09-01 (×15): qty 10

## 2021-09-01 MED ORDER — ACETAMINOPHEN 650 MG RE SUPP: 650.0000 mg | Freq: Four times a day (QID) | RECTAL | Status: DC | PRN

## 2021-09-01 MED ORDER — HYDROCORTISONE ACETATE 25 MG RE SUPP
25.0000 mg | Freq: Two times a day (BID) | RECTAL | Status: AC
  Administered 2021-09-01 – 2021-09-02 (×2): 25 mg via RECTAL
  Filled 2021-09-01 (×4): qty 1

## 2021-09-01 MED ORDER — HYDROCODONE-ACETAMINOPHEN 5-325 MG PO TABS: 1.0000 | ORAL_TABLET | ORAL | Status: DC | PRN

## 2021-09-01 MED ORDER — CARVEDILOL 6.25 MG PO TABS
6.2500 mg | ORAL_TABLET | Freq: Two times a day (BID) | ORAL | Status: DC
  Administered 2021-09-01 – 2021-09-02 (×4): 6.25 mg via ORAL
  Filled 2021-09-01 (×4): qty 1

## 2021-09-01 MED ORDER — METRONIDAZOLE 500 MG/100ML IV SOLN
500.0000 mg | Freq: Two times a day (BID) | INTRAVENOUS | Status: DC
  Administered 2021-09-01 – 2021-09-02 (×3): 500 mg via INTRAVENOUS
  Filled 2021-09-01 (×3): qty 100

## 2021-09-01 MED ORDER — ONDANSETRON HCL 4 MG PO TABS: 4.0000 mg | ORAL_TABLET | Freq: Four times a day (QID) | ORAL | Status: DC | PRN

## 2021-09-01 MED ORDER — MAGNESIUM SULFATE 2 GM/50ML IV SOLN
2.0000 g | Freq: Once | INTRAVENOUS | Status: AC
  Administered 2021-09-01: 2 g via INTRAVENOUS
  Filled 2021-09-01: qty 50

## 2021-09-01 MED ORDER — GABAPENTIN 100 MG PO CAPS
100.0000 mg | ORAL_CAPSULE | Freq: Two times a day (BID) | ORAL | Status: DC
Start: 2021-09-01 — End: 2021-09-13
  Administered 2021-09-01 – 2021-09-13 (×25): 100 mg via ORAL
  Filled 2021-09-01 (×25): qty 1

## 2021-09-01 MED ORDER — ONDANSETRON HCL 4 MG/2ML IJ SOLN
4.0000 mg | Freq: Four times a day (QID) | INTRAMUSCULAR | Status: DC | PRN
  Administered 2021-09-02: 4 mg via INTRAVENOUS
  Filled 2021-09-01: qty 2

## 2021-09-01 MED ORDER — LACTATED RINGERS IV SOLN: INTRAVENOUS | Status: DC

## 2021-09-01 MED ORDER — CEPHALEXIN 500 MG PO CAPS
500.0000 mg | ORAL_CAPSULE | Freq: Two times a day (BID) | ORAL | Status: DC
  Administered 2021-09-01 – 2021-09-02 (×3): 500 mg via ORAL
  Filled 2021-09-01 (×3): qty 1

## 2021-09-01 NOTE — TOC Initial Note (Signed)
Transition of Care [facility]) - Initial/Assessment Note    Patient Details  Name: Caitlin Chavez MRN: [id number] Date of Birth: [birth year]  Transition of Care [facility] CM/SW Contact:    Dessa Phi, RN Phone Number: 09/01/2021, 1:43 PM  Clinical Narrative: Spoke to son Rodney-feels patient unable to stay home alone.PT eval await recc.                 Expected Discharge Plan:  (TBD) Barriers to Discharge: Continued Medical Work up   Patient Goals and CMS Choice Patient states their goals for this hospitalization and ongoing recovery are:: Rehab CMS Medicare.gov Compare Post Acute Care list provided to:: Patient Represenative (must comment) (Rodney(son)) Choice offered to / list presented to : NA  Expected Discharge Plan and Services Expected Discharge Plan:  (TBD)   Discharge Planning Services: CM Consult Post Acute Care Choice: [place] Living arrangements for the past 2 months: Single Family Home                                      Prior Living Arrangements/Services Living arrangements for the past 2 months: Single Family Home Lives with:: Self Patient language and need for interpreter reviewed:: Yes Do you feel safe going back to the place where you live?: Yes      Need for Family Participation in Patient Care: Yes (Comment) Care giver support system in place?: Yes (comment)   Criminal Activity/Legal Involvement Pertinent to Current Situation/Hospitalization: No - Comment as needed  Activities of Daily Living Home Assistive Devices/Equipment: Cane (specify quad or straight), Walker (specify type), Wheelchair, Bedside commode/3-in-1, Eyeglasses (reading glasses) ADL Screening (condition at time of admission) Patient's cognitive ability adequate to safely complete daily activities?: No Is the patient deaf or have difficulty hearing?: No Does the patient have difficulty seeing, even when wearing glasses/contacts?: Yes (macular degeneration -gets  injections in both eyes) Does the patient have difficulty concentrating, remembering, or making decisions?: Yes Patient able to express need for assistance with ADLs?: Yes Does the patient have difficulty dressing or bathing?: Yes Independently performs ADLs?: No Communication: Independent Dressing (OT): Needs assistance Is this a change from baseline?: Pre-admission baseline Grooming: Needs assistance Is this a change from baseline?: Pre-admission baseline Feeding: Needs assistance Is this a change from baseline?: Pre-admission baseline Bathing: Needs assistance Is this a change from baseline?: Pre-admission baseline Toileting: Needs assistance Is this a change from baseline?: Pre-admission baseline In/Out Bed: Needs assistance Is this a change from baseline?: Pre-admission baseline Walks in Home: Needs assistance Is this a change from baseline?: Pre-admission baseline Does the patient have difficulty walking or climbing stairs?: Yes Weakness of Legs: Both Weakness of Arms/Hands: Both  Permission Sought/Granted Permission sought to share information with : Case Manager Permission granted to share information with : Yes, Verbal Permission Granted  Share Information with NAME: Case manager           Emotional Assessment Appearance:: Appears stated age Attitude/Demeanor/Rapport: Gracious Affect (typically observed): Accepting Orientation: : Oriented to Self Alcohol / Substance Use: Not Applicable Psych Involvement: No (comment)  Admission diagnosis:  Neck pain [M54.2] Generalized weakness [W09.8] Acute metabolic encephalopathy [J19.14] Patient Active Problem List   Diagnosis Date Noted   Acute metabolic encephalopathy 78/29/5621   Sigmoid diverticulitis 08/31/2021   Chronic anticoagulation - on eliquis for afib 08/31/2021   Cervical pain (neck) 08/31/2021   Hypokalemia 08/31/2021   Allergy to alpha-gal  01/14/2020   Hypomagnesemia 12/10/2018   Chronic a-fib (HCC)  12/09/2018   GERD (gastroesophageal reflux disease)    Peripheral neuropathy    Anemia    History of gout 11/01/2018   Acute bilateral ankle pain 10/31/2018   Failure to thrive in adult 10/29/2018   Ankle fracture, right 10/29/2018   Falls 10/28/2018   Hiatal hernia 10/28/2018   Hyponatremia 10/28/2018   Generalized weakness 10/28/2018   Essential hypertension 10/28/2018   Hypoalbuminemia 10/28/2018   Anxiety 10/28/2018   CKD (chronic kidney disease) stage 3, GFR 30-59 ml/min ([place]) 09/04/2018   Essential tremor 07/09/2017   PCP:  Sueanne Margarita, DO Pharmacy:   [facility] [place], [place] - [address] [place] [address] [place] [address] [place] [address] Phone: [phone] Fax: [phone]     Social Determinants of Health (SDOH) Interventions    Readmission Risk Interventions     No data to display

## 2021-09-01 NOTE — Progress Notes (Signed)
PROGRESS NOTE    Caitlin Chavez  UUV:[id number] DOB: 1934-06-23 DOA: 08/31/2021 PCP: Sueanne Margarita, DO     Brief Narrative:   H/o chronic A-fib on Eliquis, hypertension, chronic UTIs presents the ER today with confusion and weakness.  Son states the patient was treated with multiple antibiotics about a week ago for E. coli.  Patient was ultimately placed on Keflex after urine cultures grew out E. coli.  Patient is also been having chronic neck pain for about the last month.  She has been on multiple rounds of steroids which are only partially beneficial.  She is ready had 2 injections of IM steroids by her PCP.  She was sent to orthopedics for consideration of injection.  This was supposed to be today.  Patient never made it to the clinic today due to lethargy.   Patient's been having some slight abdominal pain.  She has been having diarrhea for the last couple days.  Denies any fever.    Subjective:  Confusion appears has resolved, she is aaox3 but she could not remember events from yesterday Reports LLQ ab pain,  Currently no neck pain in this position, reports neck pain is positional , no weakness in arms, no numbness or tingling in arms    Family reports patient at [facility] able to  ambulate independently , lives byherself, family comes in daily , she does not drive , no recent falls ,  became weak in the last few days   Son wants half a tab of norco before she goes to bed to help her sleep, son also desires to have norco half a tab prn for pain through out the day, order placed   Son reports patient has poor appetite and 15lbs weight loss in last 3-6 months  Assessment & Plan:  Principal Problem:   Acute metabolic encephalopathy Active Problems:   Generalized weakness   Essential hypertension   Chronic a-fib (HCC)   Sigmoid diverticulitis   Chronic anticoagulation - on eliquis for afib   Cervical pain (neck)   Hypokalemia   GI bleed    Assessment and Plan:  Acute  metabolic encephalopathy -Blood culture no growth so far, chest x-ray no acute findings, UA no infection -Confusion likely due to diverticulitis, son reports patient's confusion appear has resolved today, patient currently is AAO x3  Acute blood loss anemia -had BRBPR this am -son reports patient has been having darker stool for hte past week at home ( recent steroids exposure) -she c/o LLQ pain, no epigastric pain currently -Hemodynamically stable -Close monitor H&H -Started on Protonix twice a day overnight -Hold Eliquis -GI consulted  Acute diverticulitis CT obtained in the ED showed uncomplicated sigmoid diverticulitis Received cefepime and vancomycin in the ED, currently on Keflex and Flagyl  Hyponatremia Hypokalemia Hypomagnesemia -Likely from poor oral intake, dehydration, hold HCTZ -On IV hydration, replace K and mag, recheck in the morning  HTN, stable on Coreg, hold HCTZ  PAF, currently sinus rhythm few nonsustained V. Tach -Continue Coreg -Secondary hypercoagulable state from afib ,hold Eliquis in the setting of GI bleed -Keep K above 4, mag above 2 -Continue telemetry   Cervical pain (neck) -CT c spine:  No acute fracture, does has  Multilevel degenerative changes. -was scheduled to see [facility] on day of admission but end up in the hospital, Contacted [facility] at family 's request, [facility] recommended outpatient follow up. - prn norco ordered at family's request -resume gabapentin at low dose, titrate up if  able to tolerate   FTT: will need PT eval, likely will need placement, family agrees SNF placement if Pt recommend   Body mass index is 24.63 kg/m.      I have Reviewed nursing notes, Vitals, pain scores, I/o's, Lab results and  imaging results since pt's last encounter, details please see discussion above  I ordered the following labs:  Unresulted Labs (From admission, onward)     Start     Ordered   09/02/21 0500  CBC with  Differential/Platelet  Tomorrow morning,   R       Question:  Specimen collection method  Answer:  Lab=Lab collect   09/01/21 1733   09/02/21 0500  Comprehensive metabolic panel  Tomorrow morning,   R       Question:  Specimen collection method  Answer:  Lab=Lab collect   09/01/21 1733   09/02/21 0500  Magnesium  Tomorrow morning,   R       Question:  Specimen collection method  Answer:  Lab=Lab collect   09/01/21 1733   09/02/21 0500  Iron and TIBC  Tomorrow morning,   R       Question:  Specimen collection method  Answer:  Lab=Lab collect   09/01/21 1802   09/02/21 0500  Ferritin  Tomorrow morning,   R       Question:  Specimen collection method  Answer:  Lab=Lab collect   09/01/21 1802   09/02/21 0500  Vitamin B12  Tomorrow morning,   R       Question:  Specimen collection method  Answer:  Lab=Lab collect   09/01/21 1802                   DVT prophylaxis: SCDs Start: 09/01/21 0029   Code Status:   Code Status: Full Code  Family Communication: Family at bedside Disposition:    Dispo: The patient is from: Home              Anticipated d/c is to: May need SNF placement, pending medical improvement, pending PT eval, need GI clearance              Anticipated d/c date is: TBD  Antimicrobials:    Anti-infectives (From admission, onward)    Start     Dose/Rate Route Frequency Ordered Stop   09/01/21 0800  cephALEXin (KEFLEX) capsule 500 mg        500 mg Oral Every 12 hours 09/01/21 0028     09/01/21 0800  metroNIDAZOLE (FLAGYL) IVPB 500 mg        500 mg 100 mL/hr over 60 Minutes Intravenous Every 12 hours 09/01/21 0028     08/31/21 1745  vancomycin (VANCOREADY) IVPB 1500 mg/300 mL        1,500 mg 150 mL/hr over 120 Minutes Intravenous  Once 08/31/21 1726 08/31/21 2040   08/31/21 1730  ceFEPIme (MAXIPIME) 2 g in sodium chloride 0.9 % 100 mL IVPB        2 g 200 mL/hr over 30 Minutes Intravenous  Once 08/31/21 1726 08/31/21 1836          Objective: Vitals:    09/01/21 0439 09/01/21 0704 09/01/21 0704 09/01/21 1246  BP: 131/62 (!) 121/50 (!) 121/50 133/65  Pulse: 94  86 84  Resp: 18   16  Temp: 98.4 F (36.9 C)   98.2 F (36.8 C)  TempSrc: Oral     SpO2: 99%  98% 98%  Weight:  Height:        Intake/Output Summary (Last 24 hours) at 09/01/2021 1802 Last data filed at 09/01/2021 0600 Gross per 24 hour  Intake 1159.8 ml  Output 350 ml  Net 809.8 ml   Filed Weights   08/31/21 1458  Weight: 67.1 kg    Examination:  General exam: Weak, frail, appear chronically ill, AAOx3 Respiratory system: Diminished at bases, no wheezing, no rales, no rhonchi, respiratory effort normal. Cardiovascular system:  RRR.  Gastrointestinal system: Abdomen is nondistended, left lower quadrant tenderness, normal bowel sounds heard. Central nervous system: Alert and oriented. No focal neurological deficits. Extremities:  no edema Skin: No rashes, lesions or ulcers Psychiatry: Judgement and insight appear normal. Mood & affect appropriate.     Data Reviewed: I have personally reviewed  labs and visualized  imaging studies since the last encounter and formulate the plan        Scheduled Meds:  carvedilol  6.25 mg Oral BID WC   cephALEXin  500 mg Oral Q12H   gabapentin  100 mg Oral BID   HYDROcodone-acetaminophen  0.5 tablet Oral QHS   hydrocortisone  25 mg Rectal BID   lidocaine  1 patch Transdermal Q24H   melatonin  3 mg Oral QHS   pantoprazole (PROTONIX) IV  40 mg Intravenous Q12H   Continuous Infusions:  dextrose 5% lactated ringers 75 mL/hr at 09/01/21 1324   metronidazole 500 mg (09/01/21 0906)     LOS: 0 days     Florencia Reasons, MD PhD FACP [facility]  Available via Epic secure chat 7am-7pm for nonurgent issues Please page for urgent issues To page the attending provider between 7A-7P or the covering provider during after hours 7P-7A, please log into the web site [email or web address] and access using universal [place] password for  that web site. If you do not have the password, please call the hospital operator.    09/01/2021, 6:02 PM

## 2021-09-01 NOTE — Progress Notes (Signed)
       REMOTE CROSS COVER NOTE  NAME: Caitlin Chavez MRN: [id number] DOB : 1934-10-28    Date of Service   09/01/21  HPI/Events of Note   Notified by nursing that [name] has some bleeding from a small labial tear and is requesting pain medication for neck pain  Interventions   Plan: Apply Petroleum Jelly to tear Give PRN acetaminophen Lidocaine patch      This document was prepared using Dragon voice recognition software and may include unintentional dictation errors.  Neomia Glass DNP, MHA, FNP-BC Nurse Practitioner [facility] Pager [phone]

## 2021-09-01 NOTE — Progress Notes (Signed)
Pt had bright red blood in urine. Pure wick was on to suction. Assessment shows cut top labia. Notified provider. Provider called back stated to place protective cream on area.

## 2021-09-01 NOTE — Progress Notes (Signed)
       CROSS COVER NOTE  NAME: Caitlin Chavez MRN: [id number] DOB : 31-May-1934    Date of Service   09/01/2021  HPI/Events of Note   Notified by nursing that M(r)s Quigg had bright red blood per rectum, medium amount of Type 6 stool. On review of chart M(r)s Zelada's HGB was 9.8 at 0351 down from 11.5 on 8/3 at 1600  Interventions   Plan: Repeat H/H Coags BID Protonix  Sign out provided to Dr Erlinda Hong      This document was prepared using Dragon voice recognition software and may include unintentional dictation errors.  Neomia Glass DNP, MHA, FNP-BC Nurse Practitioner [facility] Pager [phone]

## 2021-09-01 NOTE — Progress Notes (Signed)
Pt is bleeding from rectal area. Notified provider.

## 2021-09-01 NOTE — Consult Note (Addendum)
Consultation  Referring Provider:   [facility] Primary Care Physician:  Sueanne Margarita, DO Primary Gastroenterologist:  Dr. Fuller Plan       Reason for Consultation:     Anemia   Impression    Acute blood loss anemia without hemodynamic compromise  In the setting of Eliquis with overt bleeding  CBC on 09/01/2021   WBC 10.7 HGB 9.6 MCV 98.1 Platelets 166 No recent anemia studies Per son's patient's been on prednisone for the past 3 weeks for likely cervical torticollis, CT cervical in the ER unremarkable.  Patient's been having darker stool for the past week, some reflux.  On omeprazole once daily 40 mg.  Was on prednisone 3 weeks prior. Patient's been having diarrhea for at least 1 week without abdominal pain, then started to have acute abdominal pain, admitting hemoglobin 11.5 decreased 2 g from admission.  Currently steady this morning. Had large-volume bright red blood in the stool around 5 AM.  Small-volume last night Colonoscopy 2017 showed diverticulosis, internal hemorrhoids, 7 and 5 mm polyp tubular adenoma.  Diverticulitis On Keflex and flagyl, received 2 g cefepime, 1.5 g vancomycin CT showed in the ER uncomplicated sigmoid diverticulitis  Acute metabolic encephalopathy Likely secondary to diverticulitis, urine appears unremarkable, pending UA culture Unremarkable chest x-ray.  Chronic A-fib On Eliquis, last dose 08/03 in the evening.  Held today  Hypokalemia Corrected    Plan   Likely with long bouts of diarrhea and internal hemorrhoids blood most likely from hemorrhoidal bleeding. -We will add on suppository With ongoing diverticulitis flare no need for scopic evaluation at this time, will do supportive care and can potentially consider outpatient. Patient with recent prednisone and NSAID use, some darker stools, slightly decreased hemoglobin on admission, agree with PPI IV twice daily.  Can add on Carafate if needed.  Can consider endoscopic evaluation if continues to  have any bleeding inpatient.  -Suggest clear liquid diet for now. --Continue to monitor H&H with transfusion as needed to maintain hemoglobin greater than 8 given cardiac history. -Please obtain early AM CBC, LFTs, INR -Can hold Eliquis for today and possibly tomorrow to evaluate for possible need for endoscopic evaluation if patient continues to bleed or has drop in H/H   Dr. Candis Schatz will see patient the patient and give further recommendations.   Thank you for your kind consultation, we will continue to follow.         HPI:   Caitlin Chavez is a 87 y.o. female with past medical history significant for chronic A-fib on Eliquis, hypertension, history of chronic UTIs, diverticulosis, hiatal hernia with reflux, status post cholecystectomy and hysterectomy presents with acute metabolic encephalopathy  Has been having diarrhea for several days with slight abdominal discomfort.  CT showed in the ER uncomplicated sigmoid diverticulitis on 08/31/2021.  Potassium 3.3, BUN of 21, creatinine 0.9.  Patient was given Keflex and Flagyl. Nurse notified hospitalist 09/01/2021 at 6:50 AM patient had loose stools with medium amount bright red blood, hemoglobin 9.8 at 4 AM down from 11.5 on admission. Repeat hemoglobin this morning at 7 was 9.6 Patient started on twice daily Protonix  Per patient and her sons were in the room and gives some of the history patient's been having diarrhea for 4 to 5 days without abdominal pain, denies nausea or vomiting.  Then started with acute abdominal pain yesterday. Patient denies fever or chills at home, low-grade temperature in the ER. Patient's had neck pain for 4 to 6 weeks,  has been on ibuprofen 3 weeks ago about 3 to 4 pills a day, then started on prednisone 3 weeks ago 2 separate tapers since NSAIDs were not working, last dose was Tuesday.  Patient's been having darker stool tarry for the last week.  Denies Pepto, iron or Carafate.  Is on omeprazole 40 mg once  daily. Patient denies nausea, vomiting.  Patient has history of reflux.  12/14/2015 colonoscopy with Dr. Fuller Plan, good bowel prep showed 7 mm polyp transverse colon, internal hemorrhoids grade 1, diverticula in sigmoid colon, 5 mm polyp transverse colon otherwise unremarkable.  Pathology showed tubular adenoma, no recall due to age.    Past Medical History:  Diagnosis Date   Anxiety    Arthritis    hands   Carpal tunnel syndrome    Colon polyps    Diverticulosis    FUO (fever of unknown origin) 10/31/2018   GERD (gastroesophageal reflux disease)    Hiatal hernia    Hypertension    Macular degeneration    Peripheral neuropathy     Surgical History:  She  has a past surgical history that includes Cholecystectomy; Vaginal hysterectomy; Colonoscopy; Polypectomy; Cataract extraction, bilateral; and LASIK. Family History:  Her family history includes Alcohol abuse in her father; Heart attack (age of onset: 47) in her father; Heart disease in her mother; Heart failure (age of onset: 39) in her mother; Prostate cancer in her son; Stomach cancer in her maternal grandmother. Social History:   reports that she has never smoked. She has never used smokeless tobacco. She reports that she does not drink alcohol and does not use drugs.  Prior to Admission medications   Medication Sig Start Date End Date Taking? Authorizing Provider  allopurinol (ZYLOPRIM) 100 MG tablet Take 100 mg by mouth 2 (two) times daily.   Yes [provider]  ALPRAZolam Duanne Moron) 0.5 MG tablet Take 0.5 mg by mouth 2 (two) times daily as needed for anxiety.   Yes [provider]  apixaban (ELIQUIS) 5 MG TABS tablet Take 1 tablet (5 mg total) by mouth 2 (two) times daily. 12/15/18  Yes Harold Hedge, MD  carvedilol (COREG) 6.25 MG tablet Take 6.25 mg by mouth 2 (two) times daily with a meal.  07/11/15  Yes [provider]  cholecalciferol (VITAMIN D) 25 MCG (1000 UT) tablet Take 2,000 Units by mouth  daily.   Yes [provider]  EPINEPHrine 0.3 mg/0.3 mL IJ SOAJ injection Inject 0.3 mg into the muscle as needed. 01/14/20  Yes Garnet Sierras, DO  gabapentin (NEURONTIN) 300 MG capsule Take 300 mg by mouth 2 (two) times daily. 07/11/15  Yes [provider]  hydrochlorothiazide (MICROZIDE) 12.5 MG capsule Take 12.5 mg by mouth daily.   Yes [provider]  lidocaine (LIDODERM) 5 % Place 1 patch onto the skin daily as needed (For neck pain).   Yes [provider]  Menthol, Topical Analgesic, (BIOFREEZE) 4 % GEL Apply 1 application  topically daily as needed (For knee pain).   Yes [provider]  Multiple Vitamins-Minerals (OCUVITE PO) Take 1 tablet by mouth daily.    Yes [provider]  omeprazole (PRILOSEC) 40 MG capsule Take 40 mg by mouth daily. 11/18/19  Yes [provider]  Potassium 99 MG TABS Take 1 tablet by mouth 2 (two) times daily.   Yes [provider]  Probiotic Product (PROBIOTIC PO) Take 1 tablet by mouth daily.   Yes [provider]  primidone (MYSOLINE) 50  MG tablet 3 in AM, 2 in PM Patient not taking: Reported on 08/31/2021 05/30/18   Tat, Eustace Quail, DO    Current Facility-Administered Medications  Medication Dose Route Frequency Provider Last Rate Last Admin   acetaminophen (TYLENOL) tablet 650 mg  650 mg Oral Q6H PRN Kristopher Oppenheim, DO       Or   acetaminophen (TYLENOL) suppository 650 mg  650 mg Rectal Q6H PRN Kristopher Oppenheim, DO       apixaban Arne Cleveland) tablet 5 mg  5 mg Oral BID Kristopher Oppenheim, DO   5 mg at 08/31/21 2204   carvedilol (COREG) tablet 6.25 mg  6.25 mg Oral BID WC Kristopher Oppenheim, DO       cephALEXin [facility]) capsule 500 mg  500 mg Oral Q12H Kristopher Oppenheim, DO       lactated ringers infusion   Intravenous Continuous Kristopher Oppenheim, DO 75 mL/hr at 09/01/21 0352 New Bag at 09/01/21 0352   lidocaine (LIDODERM) 5 % 1 patch  1 patch Transdermal Q24H Foust, Katy L, NP   1 patch at 09/01/21 0316   metroNIDAZOLE  (FLAGYL) IVPB 500 mg  500 mg Intravenous Q12H Kristopher Oppenheim, DO       ondansetron [facility]) tablet 4 mg  4 mg Oral Q6H PRN Kristopher Oppenheim, DO       Or   ondansetron [facility]) injection 4 mg  4 mg Intravenous Q6H PRN Kristopher Oppenheim, DO       pantoprazole (PROTONIX) injection 40 mg  40 mg Intravenous Q12H Foust, Katy L, NP        Allergies as of 08/31/2021 - Review Complete 08/31/2021  Allergen Reaction Noted   Alka-seltzer [aspirin effervescent] Anaphylaxis and Hives 10/22/2016   Beef-derived products Anaphylaxis 04/22/2017   Nsaids Anaphylaxis and Hives 10/22/2016   Other Anaphylaxis and Rash 10/22/2016   Pork-derived products Anaphylaxis 04/22/2017   Codeine Hives and Nausea And Vomiting 04/12/2009    Review of Systems:    Constitutional: No weight loss, fever, chills, weakness or fatigue HEENT: Eyes: No change in vision               Ears, Nose, Throat:  No change in hearing or congestion Skin: No rash or itching Cardiovascular: No chest pain, chest pressure or palpitations   Respiratory: No SOB or cough Gastrointestinal: See HPI and otherwise negative Genitourinary: No dysuria or change in urinary frequency Neurological: No headache, dizziness or syncope Musculoskeletal: No new muscle or joint pain Hematologic: No bleeding or bruising Psychiatric: No history of depression or anxiety     Physical Exam:  Vital signs in last 24 hours: Temp:  [98.3 F (36.8 C)-99.9 F (37.7 C)] 98.4 F (36.9 C) (08/04 0439) Pulse Rate:  [83-108] 86 (08/04 0704) Resp:  [15-25] 18 (08/04 0439) BP: (101-136)/(50-83) 121/50 (08/04 0704) SpO2:  [92 %-99 %] 98 % (08/04 0704) Weight:  [67.1 kg] 67.1 kg (08/03 1458) Last BM Date : 09/01/21 Last BM recorded by nurses in past 5 days Stool Type: Type 7 (Liquid consistency with no solid pieces) (09/01/2021  6:33 AM)  General:   Elderly uncomfortable appearing female Head:  Normocephalic and atraumatic.  Neck turn to the right with pain with movement Eyes: sclerae  anicteric,conjunctive pink  Heart:  regular rate and rhythm Pulm: Clear anteriorly; no wheezing Abdomen:  Soft, Obese AB, Active bowel sounds. marked tenderness in the LLQ. With guarding and Without rebound, No organomegaly appreciated. Extremities:  With edema. Msk:  Symmetrical without gross deformities. Peripheral pulses  intact.  Neurologic:  Alert and  oriented x4;  No focal deficits.  Skin:   Dry and intact without significant lesions or rashes. Psychiatric:  Cooperative. Normal mood and affect.  LAB RESULTS: Recent Labs    08/31/21 1602 09/01/21 0351 09/01/21 0720  WBC 14.7* 10.7*  --   HGB 11.5* 9.8* 9.6*  HCT 34.4* 30.3* 29.3*  PLT 240 166  --    BMET Recent Labs    08/31/21 1603 09/01/21 0351  NA 133* 131*  K 3.3* 3.5  CL 94* 100  CO2 24 20*  GLUCOSE 107* 83  BUN 21 18  CREATININE 0.99 0.88  CALCIUM 8.6* 7.9*   LFT Recent Labs    09/01/21 0351  PROT 5.2*  ALBUMIN 2.2*  AST 22  ALT 31  ALKPHOS 54  BILITOT 1.5*   PT/INR Recent Labs    08/31/21 1609  LABPROT 22.4*  INR 2.0*    STUDIES: CT CERVICAL SPINE WO CONTRAST  Result Date: 09/01/2021 CLINICAL DATA:  Chronic neck pain. EXAM: CT CERVICAL SPINE WITHOUT CONTRAST TECHNIQUE: Multidetector CT imaging of the cervical spine was performed without intravenous contrast. Multiplanar CT image reconstructions were also generated. RADIATION DOSE REDUCTION: This exam was performed according to the departmental dose-optimization program which includes automated exposure control, adjustment of the mA and/or kV according to patient size and/or use of iterative reconstruction technique. COMPARISON:  None Available. FINDINGS: Alignment: There is mild anterolisthesis at C5-C6. Skull base and vertebrae: No acute fracture. No primary bone lesion or focal pathologic process. Soft tissues and spinal canal: No prevertebral fluid or swelling. No visible canal hematoma. Disc levels: Multilevel intervertebral disc space  narrowing, uncovertebral osteophyte formation and facet arthropathy is noted bilaterally resulting in mild spinal canal stenosis. There is mild to moderate neural foraminal narrowing at C3-C4 and C4-C5. Upper chest: Negative. Other: None. IMPRESSION: 1. No acute fracture. 2. Multilevel degenerative changes as detailed above. Electronically Signed   By: Brett Fairy M.D.   On: 09/01/2021 00:04   CT ABDOMEN PELVIS W CONTRAST  Result Date: 08/31/2021 CLINICAL DATA:  Abdominal pain. EXAM: CT ABDOMEN AND PELVIS WITH CONTRAST TECHNIQUE: Multidetector CT imaging of the abdomen and pelvis was performed using the standard protocol following bolus administration of intravenous contrast. RADIATION DOSE REDUCTION: This exam was performed according to the departmental dose-optimization program which includes automated exposure control, adjustment of the mA and/or kV according to patient size and/or use of iterative reconstruction technique. CONTRAST:  1104m OMNIPAQUE IOHEXOL 300 MG/ML  SOLN COMPARISON:  October 28, 2018 FINDINGS: Lower chest: Mild atelectasis is seen within the bilateral lung bases. A small pericardial effusion is seen. Hepatobiliary: No focal liver abnormality is seen. Status post cholecystectomy. No biliary dilatation. Pancreas: Unremarkable. No pancreatic ductal dilatation or surrounding inflammatory changes. Spleen: Normal in size without focal abnormality. Adrenals/Urinary Tract: Adrenal glands are unremarkable. Kidneys are normal in size, without renal calculi or hydronephrosis. An 11 mm diameter simple cyst is seen within the anterior aspect of the mid right kidney. No additional follow-up or imaging is recommended. Bladder is unremarkable. Stomach/Bowel: There is a large hiatal hernia. Appendix appears normal. No evidence of bowel dilatation. Noninflamed diverticula are seen throughout the large bowel. Mildly thickened and inflamed distal descending and proximal sigmoid colon is also noted.  Vascular/Lymphatic: Aortic atherosclerosis. No enlarged abdominal or pelvic lymph nodes. Reproductive: Status post hysterectomy. No adnexal masses. Other: A 3.3 cm x 2.5 cm fat containing umbilical hernia is seen. No abdominopelvic ascites. Musculoskeletal: Multilevel degenerative  changes are seen throughout the lumbar spine. IMPRESSION: 1. Mild acute diverticulitis involving the distal descending and proximal sigmoid colon. 2. Evidence of prior cholecystectomy and hysterectomy. 3. Aortic atherosclerosis. Aortic Atherosclerosis (ICD10-I70.0). Electronically Signed   By: Virgina Norfolk M.D.   On: 08/31/2021 20:42   DG Chest Port 1 View  Result Date: 08/31/2021 CLINICAL DATA:  Questionable sepsis. EXAM: PORTABLE CHEST 1 VIEW COMPARISON:  Chest x-ray 12/09/2018.  Chest CT 08/12/2015 FINDINGS: The heart size and mediastinal contours are within normal limits. Both lungs are clear. The visualized skeletal structures are unremarkable. Large hiatal hernia is unchanged. IMPRESSION: No active disease. Electronically Signed   By: Ronney Asters M.D.   On: 08/31/2021 16:57     Vladimir Crofts  09/01/2021, 8:48 AM -------------------------------------------------------------------------------------  I have taken a history, reviewed the chart and examined the patient. I performed a substantive portion of this encounter, including complete performance of at least one of the key components, in conjunction with the APP. I agree with the APP's note, impression and recommendations  87 year old female with history of A-fib, who presented with altered mental status and pain, subsequently found to have CT evidence of diverticulitis.  Shortly after admission she had a profuse episode of bright red blood per rectum, and experienced a one half point drop in her hemoglobin.  Her son also states that she had been complaining of dark stools in the weeks leading up to her admission.  Her hemoglobin was close to 12 on admission with  normal BUN. Her last colonoscopy was in 2017 with noted diverticulosis and a few small polyps. Her bleeding may have been from hemorrhoids versus diverticular disease.  I told the patient and her family that I would not recommend performing colonoscopy at this time given the potential risk for perforation in the setting of diverticulitis.  We discussed how most diverticular bleeds resolved spontaneously and do not require any hemostasis.  Should the patient continue to show active bleeding, she may be a better candidate for angiography/embolization. Low suspicion for upper GI source of bleeding as well, given normal BUN and unclear history of potentially melanic stools. Recommend continuing to treat diverticulitis with antibiotics, fluids and pain control. If patient demonstrates evidence of brisk lower GI bleeding, would recommend CTA.    Anam Bobby E. Candis Schatz, MD [facility]

## 2021-09-01 NOTE — Care Management Obs Status (Signed)
[facility] NOTIFICATION   Patient Details  Name: CHRISTINAMARIE TALL MRN: [id number] Date of Birth: [birth year]   Medicare Observation Status Notification Given:  Yes    MahabirJuliann Pulse, RN 09/01/2021, 1:47 PM

## 2021-09-02 ENCOUNTER — Inpatient Hospital Stay (HOSPITAL_COMMUNITY): Payer: Medicare Other

## 2021-09-02 DIAGNOSIS — G9341 Metabolic encephalopathy: Secondary | ICD-10-CM | POA: Diagnosis not present

## 2021-09-02 LAB — COMPREHENSIVE METABOLIC PANEL
ALT: 31 U/L (ref 0–44)
AST: 22 U/L (ref 15–41)
Albumin: 2.2 g/dL — ABNORMAL LOW (ref 3.5–5.0)
Alkaline Phosphatase: 58 U/L (ref 38–126)
Anion gap: 8 (ref 5–15)
BUN: 14 mg/dL (ref 8–23)
CO2: 25 mmol/L (ref 22–32)
Calcium: 7.9 mg/dL — ABNORMAL LOW (ref 8.9–10.3)
Chloride: 96 mmol/L — ABNORMAL LOW (ref 98–111)
Creatinine, Ser: 0.56 mg/dL (ref 0.44–1.00)
GFR, Estimated: >60 mL/min (ref >60)
Glucose, Bld: 148 mg/dL — ABNORMAL HIGH (ref 70–99)
Potassium: 2.9 mmol/L — ABNORMAL LOW (ref 3.5–5.1)
Sodium: 129 mmol/L — ABNORMAL LOW (ref 135–145)
Total Bilirubin: 0.9 mg/dL (ref 0.3–1.2)
Total Protein: 5.4 g/dL — ABNORMAL LOW (ref 6.5–8.1)

## 2021-09-02 LAB — FERRITIN: Ferritin: 634 ng/mL — ABNORMAL HIGH (ref 11–307)

## 2021-09-02 LAB — MAGNESIUM: Magnesium: 1.8 mg/dL (ref 1.7–2.4)

## 2021-09-02 LAB — IRON AND TIBC
Iron: 27 ug/dL — ABNORMAL LOW (ref 28–170)
Saturation Ratios: 18 % (ref 10.4–31.8)
TIBC: 154 ug/dL — ABNORMAL LOW (ref 250–450)
UIBC: 127 ug/dL

## 2021-09-02 LAB — CBC WITH DIFFERENTIAL/PLATELET
Abs Immature Granulocytes: 0.03 10*3/uL (ref 0.00–0.07)
Basophils Absolute: 0 10*3/uL (ref 0.0–0.1)
Basophils Relative: 0 %
Eosinophils Absolute: 0 10*3/uL (ref 0.0–0.5)
Eosinophils Relative: 0 %
HCT: 27.8 % — ABNORMAL LOW (ref 36.0–46.0)
Hemoglobin: 9.5 g/dL — ABNORMAL LOW (ref 12.0–15.0)
Immature Granulocytes: 0 %
Lymphocytes Relative: 6 %
Lymphs Abs: 0.5 10*3/uL — ABNORMAL LOW (ref 0.7–4.0)
MCH: 32 pg (ref 26.0–34.0)
MCHC: 34.2 g/dL (ref 30.0–36.0)
MCV: 93.6 fL (ref 80.0–100.0)
Monocytes Absolute: 0.7 10*3/uL (ref 0.1–1.0)
Monocytes Relative: 8 %
Neutro Abs: 6.9 10*3/uL (ref 1.7–7.7)
Neutrophils Relative %: 86 %
Platelets: 184 10*3/uL (ref 150–400)
RBC: 2.97 MIL/uL — ABNORMAL LOW (ref 3.87–5.11)
RDW: 13.6 % (ref 11.5–15.5)
WBC: 8.1 10*3/uL (ref 4.0–10.5)
nRBC: 0 % (ref 0.0–0.2)

## 2021-09-02 LAB — VITAMIN B12: Vitamin B-12: 2642 pg/mL — ABNORMAL HIGH (ref 180–914)

## 2021-09-02 LAB — CBC
HCT: 29.1 % — ABNORMAL LOW (ref 36.0–46.0)
Hemoglobin: 9.8 g/dL — ABNORMAL LOW (ref 12.0–15.0)
MCH: 31.7 pg (ref 26.0–34.0)
MCHC: 33.7 g/dL (ref 30.0–36.0)
MCV: 94.2 fL (ref 80.0–100.0)
Platelets: 220 10*3/uL (ref 150–400)
RBC: 3.09 MIL/uL — ABNORMAL LOW (ref 3.87–5.11)
RDW: 13.8 % (ref 11.5–15.5)
WBC: 8.1 10*3/uL (ref 4.0–10.5)
nRBC: 0 % (ref 0.0–0.2)

## 2021-09-02 LAB — LACTIC ACID, PLASMA
Lactic Acid, Venous: 3.2 mmol/L (ref 0.5–1.9)
Lactic Acid, Venous: 1.5 mmol/L (ref 0.5–1.9)

## 2021-09-02 LAB — MRSA NEXT GEN BY PCR, NASAL: MRSA by PCR Next Gen: NOT DETECTED

## 2021-09-02 MED ORDER — LIDOCAINE VISCOUS HCL 2 % MT SOLN
15.0000 mL | OROMUCOSAL | Status: DC | PRN
  Administered 2021-09-02: 15 mL via OROMUCOSAL
  Filled 2021-09-02 (×2): qty 15

## 2021-09-02 MED ORDER — LACTATED RINGERS IV BOLUS
1000.0000 mL | Freq: Once | INTRAVENOUS | Status: AC
  Administered 2021-09-02: 1000 mL via INTRAVENOUS

## 2021-09-02 MED ORDER — SODIUM CHLORIDE 0.9 % IV SOLN: INTRAVENOUS | Status: AC

## 2021-09-02 MED ORDER — UNJURY CHICKEN SOUP POWDER
2.0000 [oz_av] | Freq: Four times a day (QID) | ORAL | Status: DC
Start: 2021-09-02 — End: 2021-09-03
  Filled 2021-09-02 (×6): qty 13.5

## 2021-09-02 MED ORDER — POTASSIUM CHLORIDE 20 MEQ PO PACK
40.0000 meq | PACK | Freq: Once | ORAL | Status: AC
  Administered 2021-09-02: 40 meq via ORAL
  Filled 2021-09-02: qty 2

## 2021-09-02 MED ORDER — SODIUM CHLORIDE 0.9 % IV BOLUS
1000.0000 mL | Freq: Once | INTRAVENOUS | Status: AC
  Administered 2021-09-02: 1000 mL via INTRAVENOUS

## 2021-09-02 MED ORDER — ALBUTEROL SULFATE (2.5 MG/3ML) 0.083% IN NEBU
INHALATION_SOLUTION | RESPIRATORY_TRACT | Status: AC
  Administered 2021-09-02: 2.5 mg
  Filled 2021-09-02: qty 3

## 2021-09-02 MED ORDER — PIPERACILLIN-TAZOBACTAM 3.375 G IVPB
3.3750 g | Freq: Three times a day (TID) | INTRAVENOUS | Status: DC
  Administered 2021-09-02: 3.375 g via INTRAVENOUS
  Filled 2021-09-02 (×2): qty 50

## 2021-09-02 NOTE — Progress Notes (Signed)
Pt being followed by [facility] for Sepsis protocol. 

## 2021-09-02 NOTE — Progress Notes (Signed)
PROGRESS NOTE    Caitlin LIKE  Chavez DOB: December 21, 1934 DOA: 08/31/2021 PCP: Sueanne Margarita, DO     Brief Narrative:   H/o chronic A-fib on Eliquis, hypertension, chronic UTIs presents the ER today with confusion and weakness.  Son states the patient was treated with multiple antibiotics about a week ago for E. coli.  Patient was ultimately placed on Keflex after urine cultures grew out E. coli.  Patient is also been having chronic neck pain for about the last month.  She has been on multiple rounds of steroids which are only partially beneficial.  She is ready had 2 injections of IM steroids by her PCP.  She was sent to orthopedics for consideration of injection.  This was supposed to be today.  Patient never made it to the clinic today due to lethargy.   Patient's been having some slight abdominal pain.  She has been having diarrhea for the last couple days.  Denies any fever.    Subjective:  Sitting up in chair No confusion,  Reports less LLQ ab pain, had bm this am, had small amount of dark blood mixed with stool       Son reports patient has poor appetite and 15lbs weight loss in last 3-6 months  Assessment & Plan:  Principal Problem:   Acute metabolic encephalopathy Active Problems:   Generalized weakness   Essential hypertension   Chronic a-fib (HCC)   Sigmoid diverticulitis   Chronic anticoagulation - on eliquis for afib   Cervical pain (neck)   Hypokalemia   GI bleed    Assessment and Plan:  Acute metabolic encephalopathy -Blood culture no growth so far, chest x-ray no acute findings, UA no infection -Confusion likely due to diverticulitis, electrolyte abnormalities -Resolved, AAOx3   Acute diverticulitis CT obtained in the ED showed uncomplicated sigmoid diverticulitis Received cefepime and vancomycin in the ED, currently on Keflex and Flagyl, improving, WBC normalized  Acute blood loss anemia -had BRBPR  -son reports patient has been having  darker stool for hte past week at home ( recent steroids exposure) -she c/o LLQ pain, no epigastric pain currently -Hemodynamically stable -Close monitor H&H, has been stable -Started on Protonix twice a day  -Hold Eliquis -GI consulted, will follow recommendation  Hyponatremia Hypokalemia Hypomagnesemia -Likely from poor oral intake, dehydration, hold HCTZ -On IV hydration, replace K and mag, recheck in the morning  HTN, stable on Coreg, hold HCTZ  PAF, currently sinus rhythm few nonsustained V. Tach -Continue Coreg -Secondary hypercoagulable state from afib ,hold Eliquis in the setting of GI bleed -Keep K above 4, mag above 2 -Continue telemetry   Cervical pain (neck) -CT c spine:  No acute fracture, does has  Multilevel degenerative changes. -was scheduled to see [facility] on day of admission but end up in the hospital, Contacted [facility] at family 's request, [facility] recommended outpatient follow up. - prn norco ordered at family's request -resume gabapentin at low dose, titrate up if able to tolerate  -Reported lidocaine patch helped ,currently no neck pain in this position, reports neck pain is positional , no weakness in arms, no numbness or tingling in arms  FTT: Family reports patient at [facility] able to  ambulate independently , lives byherself, family comes in daily , she does not drive , no recent falls , became weak in the last few days  Seen by PT eval who recommend SNF,  family agrees SNF placement    Body mass index is 24.63  kg/m.      I have Reviewed nursing notes, Vitals, pain scores, I/o's, Lab results and  imaging results since pt's last encounter, details please see discussion above  I ordered the following labs:  Unresulted Labs (From admission, onward)     Start     Ordered   09/02/21 0500  CBC with Differential/Platelet  Tomorrow morning,   R       Question:  Specimen collection method  Answer:  Lab=Lab collect   09/01/21 1733    09/02/21 0500  Comprehensive metabolic panel  Tomorrow morning,   R       Question:  Specimen collection method  Answer:  Lab=Lab collect   09/01/21 1733   09/02/21 0500  Magnesium  Tomorrow morning,   R       Question:  Specimen collection method  Answer:  Lab=Lab collect   09/01/21 1733                                     DVT prophylaxis: SCDs Start: 09/01/21 0029   Code Status:   Code Status: Full Code  Family Communication: Family at bedside Disposition:    Dispo: The patient is from: Home              Anticipated d/c is to:  SNF once cleared by GI                Antimicrobials:    Anti-infectives (From admission, onward)    Start     Dose/Rate Route Frequency Ordered Stop   09/01/21 0800  cephALEXin (KEFLEX) capsule 500 mg        500 mg Oral Every 12 hours 09/01/21 0028     09/01/21 0800  metroNIDAZOLE (FLAGYL) IVPB 500 mg        500 mg 100 mL/hr over 60 Minutes Intravenous Every 12 hours 09/01/21 0028     08/31/21 1745  vancomycin (VANCOREADY) IVPB 1500 mg/300 mL        1,500 mg 150 mL/hr over 120 Minutes Intravenous  Once 08/31/21 1726 08/31/21 2040   08/31/21 1730  ceFEPIme (MAXIPIME) 2 g in sodium chloride 0.9 % 100 mL IVPB        2 g 200 mL/hr over 30 Minutes Intravenous  Once 08/31/21 1726 08/31/21 1836          Objective: Vitals:   09/01/21 1246 09/01/21 2028 09/02/21 0421 09/02/21 1207  BP: 133/65 (!) 117/51 137/75 128/74  Pulse: 84 81 89 82  Resp: '16 18 18 '$ (!) 23  Temp: 98.2 F (36.8 C) 98.6 F (37 C) 99.9 F (37.7 C) 97.8 F (36.6 C)  TempSrc:  Oral Oral Oral  SpO2: 98% 100% 97% 96%  Weight:      Height:        Intake/Output Summary (Last 24 hours) at 09/02/2021 1713 Last data filed at 09/02/2021 1000 Gross per 24 hour  Intake 727.95 ml  Output 950 ml  Net -222.05 ml   Filed Weights   08/31/21 1458  Weight: 67.1 kg    Examination:  General exam: Weak, frail, appear chronically ill, AAOx3 Respiratory system: Diminished at  bases, no wheezing, no rales, no rhonchi, respiratory effort normal. Cardiovascular system:  RRR.  Gastrointestinal system: Abdomen is nondistended, left lower quadrant tenderness, normal bowel sounds heard. Central nervous system: Alert and oriented. No focal neurological deficits. Extremities:  no edema Skin: No rashes,  lesions or ulcers Psychiatry: Judgement and insight appear normal. Mood & affect appropriate.     Data Reviewed: I have personally reviewed  labs and visualized  imaging studies since the last encounter and formulate the plan        Scheduled Meds:  carvedilol  6.25 mg Oral BID WC   cephALEXin  500 mg Oral Q12H   gabapentin  100 mg Oral BID   HYDROcodone-acetaminophen  0.5 tablet Oral QHS   hydrocortisone  25 mg Rectal BID   lidocaine  1 patch Transdermal Q24H   melatonin  3 mg Oral QHS   pantoprazole (PROTONIX) IV  40 mg Intravenous Q12H   protein supplement  2 oz Oral QID   Continuous Infusions:  sodium chloride 75 mL/hr at 09/02/21 1150   metronidazole 500 mg (09/02/21 0808)     LOS: 1 day     Florencia Reasons, MD PhD FACP [facility]  Available via Epic secure chat 7am-7pm for nonurgent issues Please page for urgent issues To page the attending provider between 7A-7P or the covering provider during after hours 7P-7A, please log into the web site [email or web address] and access using universal [place] password for that web site. If you do not have the password, please call the hospital operator.    09/02/2021, 5:13 PM

## 2021-09-02 NOTE — Evaluation (Signed)
Physical Therapy Evaluation Patient Details Name: Caitlin Chavez MRN: [id number] DOB: September 28, 1934 Today's Date: 09/02/2021  History of Present Illness  87 year old female history of chronic A-fib on Eliquis, hypertension, chronic UTIs presents the ER today with confusion and weakness. She has been getting injections for neck pain recently. Sons report significant decline in her function in the last month.  Clinical Impression  Pt admitted with above diagnosis.  Pt currently with functional limitations due to the deficits listed below (see PT Problem List). Pt will benefit from skilled PT to increase their independence and safety with mobility to allow discharge to the venue listed below.  Pt is normally independent with mobility and lives alone.  Her family cannot provide 24/7 and recommend SNF for short term rehab.        Recommendations for follow up therapy are one component of a multi-disciplinary discharge planning process, led by the attending physician.  Recommendations may be updated based on patient status, additional functional criteria and insurance authorization.  Follow Up Recommendations Skilled nursing-short term rehab (<3 hours/day) Can patient physically be transported by private vehicle: Yes    Assistance Recommended at Discharge Frequent or constant Supervision/Assistance  Patient can return home with the following  A little help with walking and/or transfers    Equipment Recommendations None recommended by PT  Recommendations for Other Services  OT consult    Functional Status Assessment Patient has had a recent decline in their functional status and demonstrates the ability to make significant improvements in function in a reasonable and predictable amount of time.     Precautions / Restrictions Precautions Precautions: Fall      Mobility  Bed Mobility Overal bed mobility: Needs Assistance Bed Mobility: Sidelying to Sit, Sit to Supine   Sidelying to sit: Min  assist   Sit to supine: Mod assist, Max assist   General bed mobility comments: Pt kept both hands at her neck to support her neck and head.  She was able to help with rolling and initiate getting her legs off the bed, but needed MOD/MAX A to get trunk upright.  Dizziness upon sitting which lessened as she sat on EOB.    Transfers Overall transfer level: Needs assistance Equipment used: Rolling walker (2 wheels) Transfers: Sit to/from Stand, Bed to chair/wheelchair/BSC Sit to Stand: Mod assist Stand pivot transfers: Min assist         General transfer comment: slow stand with cues for hand placement.  Difficulty transitioning COG over BOS with L LE. Once up, she required decreased A for SPT. Declined gait.    Ambulation/Gait               General Gait Details: declined  Financial trader Rankin (Stroke Patients Only)       Balance Overall balance assessment: Mild deficits observed, not formally tested                                           Pertinent Vitals/Pain Pain Assessment Pain Assessment: Faces Faces Pain Scale: Hurts even more Pain Location: neck Pain Descriptors / Indicators: Grimacing Pain Intervention(s): Monitored during session, Premedicated before session, Limited activity within patient's tolerance, Repositioned    Home Living Family/patient expects to be discharged to:: Skilled nursing facility Living Arrangements: Alone  Prior Function Prior Level of Function : Independent/Modified Independent             Mobility Comments: 4 weeks ago was ambulatory without an AD.  Has progressed to using cane and more recently her family felt she should have been using a RW.       Hand Dominance   Dominant Hand: Right    Extremity/Trunk Assessment   Upper Extremity Assessment Upper Extremity Assessment: Defer to OT evaluation    Lower Extremity  Assessment Lower Extremity Assessment: Generalized weakness;LLE deficits/detail LLE Deficits / Details: swelling in L knee    Cervical / Trunk Assessment Cervical / Trunk Assessment: Other exceptions Cervical / Trunk Exceptions: keeps head turned to the right while in bed.  Can turn to neutral, but not past that.  Communication   Communication: No difficulties  Cognition Arousal/Alertness: Awake/alert Behavior During Therapy: WFL for tasks assessed/performed Overall Cognitive Status: Within Functional Limits for tasks assessed                                          General Comments      Exercises     Assessment/Plan    PT Assessment Patient needs continued PT services  PT Problem List Decreased strength;Decreased activity tolerance;Decreased mobility;Pain       PT Treatment Interventions Gait training;Functional mobility training;Balance training;Therapeutic activities;Therapeutic exercise    PT Goals (Current goals can be found in the Care Plan section)  Acute Rehab PT Goals Patient Stated Goal: Get back her independence PT Goal Formulation: With patient/family Time For Goal Achievement: 09/16/21 Potential to Achieve Goals: Good    Frequency Min 2X/week     Co-evaluation               AM-PAC PT "6 Clicks" Mobility  Outcome Measure Help needed turning from your back to your side while in a flat bed without using bedrails?: A Little Help needed moving from lying on your back to sitting on the side of a flat bed without using bedrails?: A Lot Help needed moving to and from a bed to a chair (including a wheelchair)?: A Little Help needed standing up from a chair using your arms (e.g., wheelchair or bedside chair)?: A Lot Help needed to walk in hospital room?: A Little Help needed climbing 3-5 steps with a railing? : A Lot 6 Click Score: 15    End of Session Equipment Utilized During Treatment: Gait belt Activity Tolerance: Patient  tolerated treatment well Patient left: in chair;with call bell/phone within reach;with family/visitor present Nurse Communication: Mobility status PT Visit Diagnosis: Muscle weakness (generalized) (M62.81);Pain;Difficulty in walking, not elsewhere classified (R26.2) Pain - part of body:  (neck)    Time: 6160-7371 PT Time Calculation (min) (ACUTE ONLY): 30 min   Charges:   PT Evaluation $PT Eval Moderate Complexity: 1 Mod PT Treatments $Therapeutic Activity: 8-22 mins        Azaiah Mello L. Tamala Julian, [place]  09/02/2021   Galen Manila 09/02/2021, 9:57 AM

## 2021-09-02 NOTE — Progress Notes (Signed)
   09/02/21 1759  Assess: MEWS Score  Temp (!) 102.8 F (39.3 C)  BP (!) 140/73  MAP (mmHg) 95  Pulse Rate (!) 105  Resp (!) 24  SpO2 100 %  Assess: MEWS Score  MEWS Temp 2  MEWS Systolic 0  MEWS Pulse 1  MEWS RR 1  MEWS LOC 0  MEWS Score 4  MEWS Score Color Red  Assess: if the MEWS score is Yellow or Red  Were vital signs taken at a resting state? Yes  Focused Assessment Change from prior assessment (see assessment flowsheet)  Does the patient meet 2 or more of the SIRS criteria? Yes  Does the patient have a confirmed or suspected source of infection? Yes  Provider and Rapid Response Notified? Yes  MEWS guidelines implemented *See Row Information* Yes  Treat  MEWS Interventions Administered prn meds/treatments;Escalated (See documentation below);Consulted Respiratory Therapy;Administered scheduled meds/treatments  Take Vital Signs  Increase Vital Sign Frequency  Red: Q 1hr X 4 then Q 4hr X 4, if remains red, continue Q 4hrs  Escalate  MEWS: Escalate Red: discuss with charge nurse/RN and provider, consider discussing with RRT  Notify: Charge Nurse/RN  Name of Charge Nurse/RN Notified Abby, CN  Date Charge Nurse/RN Notified 09/02/21  Time Charge Nurse/RN Notified 1800  Notify: Provider  Provider Name/Title Dr. Erlinda Hong  Date Provider Notified 09/02/21  Time Provider Notified 1800  Method of Notification Page  Notification Reason Change in status  Provider response At bedside;See new orders  Date of Provider Response 09/02/21  Time of Provider Response 1815  Document  Patient Outcome Transferred/level of care increased  Progress note created (see row info) Yes  Assess: SIRS CRITERIA  SIRS Temperature  1  SIRS Pulse 1  SIRS Respirations  1  SIRS WBC 1  SIRS Score Sum  4   See RRT note

## 2021-09-02 NOTE — Progress Notes (Addendum)
Paged by RN around 6pm to reports patient is having sudden clinical changes, she has been having a fairly uneventful day and has been sitting in chair for the day, but was found to have decreased responsiveness around 6pm, she developed fever, tachycardia, tachypnea, bp stable, she appear shaky, weak, she denies pain. No hypoxia. Stat cxr no acute findings, repeat cbc/bmp/check lactic acid, broaden abx to zosyn. Transfer to stepdown, case discussed with RN and family at bedside   She has fever, tachycardia, tachypnea, diverticulitis, meet sepsis criteria, start sepsis protocol.

## 2021-09-02 NOTE — Progress Notes (Signed)
Progress Note    ASSESSMENT AND PLAN:   Acute sigmoid diverticulitis. No abscess on CT 8/3. LGI bleed-likely diverticular vs hemorrhoidal vs both. Hb stable.  Appears to have resolved. Last colon 2017 Chronic A-fib on Eliquis (last dose 8/3)  Plan: -Advance diet to full liquid. OK to have Ensure. -Continue antibiotics -Continue HC 2.5% suppositories -Trend CBC. -If any active brisk bleeding, CTA followed by IR embolization. -No need for repeat colonoscopy at this time. -D/W pt and pt's sons   SUBJECTIVE   Feels better than yesterday. Mild abdominal pain Had BM this morning-some old maroonish blood. Suppositories are working for the hemorrhoids Would like to advance diet.   OBJECTIVE:     Vital signs in last 24 hours: Temp:  [98.2 F (36.8 C)-99.9 F (37.7 C)] 99.9 F (37.7 C) (08/05 0421) Pulse Rate:  [81-89] 89 (08/05 0421) Resp:  [16-18] 18 (08/05 0421) BP: (117-137)/(51-75) 137/75 (08/05 0421) SpO2:  [97 %-100 %] 97 % (08/05 0421) Last BM Date : 09/01/21 General:   Alert, well-developed female in NAD EENT:  Normal hearing, non icteric sclera, conjunctive pink. . Abdomen:  Soft, nondistended, still has tenderness in the left lower quadrant.  Better than yesterday.  Normal bowel sounds,.  She wants me to hold off on rectal exam.      Neurologic:  Alert and  oriented x4;  grossly normal neurologically. Psych:  Pleasant, cooperative.  Normal mood and affect.   Intake/Output from previous day: 08/04 0701 - 08/05 0700 In: 628 [P.O.:120; I.V.:300; IV Piggyback:208] Out: 950 [Urine:950] Intake/Output this shift: Total I/O In: 100 [P.O.:100] Out: -   Lab Results: Recent Labs    08/31/21 1602 09/01/21 0351 09/01/21 0720 09/02/21 0447  WBC 14.7* 10.7*  --  8.1  HGB 11.5* 9.8* 9.6* 9.5*  HCT 34.4* 30.3* 29.3* 27.8*  PLT 240 166  --  184   BMET Recent Labs    08/31/21 1603 09/01/21 0351 09/02/21 0447  NA 133* 131* 129*  K 3.3* 3.5 2.9*  CL  94* 100 96*  CO2 24 20* 25  GLUCOSE 107* 83 148*  BUN '21 18 14  '$ CREATININE 0.99 0.88 0.56  CALCIUM 8.6* 7.9* 7.9*   LFT Recent Labs    09/02/21 0447  PROT 5.4*  ALBUMIN 2.2*  AST 22  ALT 31  ALKPHOS 58  BILITOT 0.9   PT/INR Recent Labs    08/31/21 1609 09/01/21 0720  LABPROT 22.4* 22.5*  INR 2.0* 2.0*   Hepatitis Panel No results for input(s): "HEPBSAG", "HCVAB", "HEPAIGM", "HEPBIGM" in the last 72 hours.  DG Knee 1-2 Views Left  Result Date: 09/01/2021 CLINICAL DATA:  Left knee pain. EXAM: LEFT KNEE - 1-2 VIEW COMPARISON:  None Available. FINDINGS: There is diffuse decreased bone mineralization. Mild medial compartment of the knee joint space narrowing. Tiny superior patellar degenerative osteophyte. Tiny joint effusion. No acute fracture is seen. No dislocation. IMPRESSION: Minimal medial and patellofemoral compartment osteoarthritis. No acute fracture is seen. Electronically Signed   By: Yvonne Kendall M.D.   On: 09/01/2021 15:08   CT CERVICAL SPINE WO CONTRAST  Result Date: 09/01/2021 CLINICAL DATA:  Chronic neck pain. EXAM: CT CERVICAL SPINE WITHOUT CONTRAST TECHNIQUE: Multidetector CT imaging of the cervical spine was performed without intravenous contrast. Multiplanar CT image reconstructions were also generated. RADIATION DOSE REDUCTION: This exam was performed according to the departmental dose-optimization program which includes automated exposure control, adjustment of the mA and/or kV according to patient size and/or  use of iterative reconstruction technique. COMPARISON:  None Available. FINDINGS: Alignment: There is mild anterolisthesis at C5-C6. Skull base and vertebrae: No acute fracture. No primary bone lesion or focal pathologic process. Soft tissues and spinal canal: No prevertebral fluid or swelling. No visible canal hematoma. Disc levels: Multilevel intervertebral disc space narrowing, uncovertebral osteophyte formation and facet arthropathy is noted bilaterally  resulting in mild spinal canal stenosis. There is mild to moderate neural foraminal narrowing at C3-C4 and C4-C5. Upper chest: Negative. Other: None. IMPRESSION: 1. No acute fracture. 2. Multilevel degenerative changes as detailed above. Electronically Signed   By: Brett Fairy M.D.   On: 09/01/2021 00:04   CT ABDOMEN PELVIS W CONTRAST  Result Date: 08/31/2021 CLINICAL DATA:  Abdominal pain. EXAM: CT ABDOMEN AND PELVIS WITH CONTRAST TECHNIQUE: Multidetector CT imaging of the abdomen and pelvis was performed using the standard protocol following bolus administration of intravenous contrast. RADIATION DOSE REDUCTION: This exam was performed according to the departmental dose-optimization program which includes automated exposure control, adjustment of the mA and/or kV according to patient size and/or use of iterative reconstruction technique. CONTRAST:  115m OMNIPAQUE IOHEXOL 300 MG/ML  SOLN COMPARISON:  October 28, 2018 FINDINGS: Lower chest: Mild atelectasis is seen within the bilateral lung bases. A small pericardial effusion is seen. Hepatobiliary: No focal liver abnormality is seen. Status post cholecystectomy. No biliary dilatation. Pancreas: Unremarkable. No pancreatic ductal dilatation or surrounding inflammatory changes. Spleen: Normal in size without focal abnormality. Adrenals/Urinary Tract: Adrenal glands are unremarkable. Kidneys are normal in size, without renal calculi or hydronephrosis. An 11 mm diameter simple cyst is seen within the anterior aspect of the mid right kidney. No additional follow-up or imaging is recommended. Bladder is unremarkable. Stomach/Bowel: There is a large hiatal hernia. Appendix appears normal. No evidence of bowel dilatation. Noninflamed diverticula are seen throughout the large bowel. Mildly thickened and inflamed distal descending and proximal sigmoid colon is also noted. Vascular/Lymphatic: Aortic atherosclerosis. No enlarged abdominal or pelvic lymph nodes.  Reproductive: Status post hysterectomy. No adnexal masses. Other: A 3.3 cm x 2.5 cm fat containing umbilical hernia is seen. No abdominopelvic ascites. Musculoskeletal: Multilevel degenerative changes are seen throughout the lumbar spine. IMPRESSION: 1. Mild acute diverticulitis involving the distal descending and proximal sigmoid colon. 2. Evidence of prior cholecystectomy and hysterectomy. 3. Aortic atherosclerosis. Aortic Atherosclerosis (ICD10-I70.0). Electronically Signed   By: TVirgina NorfolkM.D.   On: 08/31/2021 20:42   DG Chest Port 1 View  Result Date: 08/31/2021 CLINICAL DATA:  Questionable sepsis. EXAM: PORTABLE CHEST 1 VIEW COMPARISON:  Chest x-ray 12/09/2018.  Chest CT 08/12/2015 FINDINGS: The heart size and mediastinal contours are within normal limits. Both lungs are clear. The visualized skeletal structures are unremarkable. Large hiatal hernia is unchanged. IMPRESSION: No active disease. Electronically Signed   By: ARonney AstersM.D.   On: 08/31/2021 16:57     Principal Problem:   Acute metabolic encephalopathy Active Problems:   Generalized weakness   Essential hypertension   Chronic a-fib (HCC)   Sigmoid diverticulitis   Chronic anticoagulation - on eliquis for afib   Cervical pain (neck)   Hypokalemia   GI bleed     LOS: 1 day     RCarmell Austria MD 09/02/2021, 11:22 AM LVel[facility] HecklerGI [phone]

## 2021-09-03 ENCOUNTER — Inpatient Hospital Stay (HOSPITAL_COMMUNITY): Payer: Medicare Other

## 2021-09-03 DIAGNOSIS — K5732 Diverticulitis of large intestine without perforation or abscess without bleeding: Secondary | ICD-10-CM | POA: Diagnosis not present

## 2021-09-03 DIAGNOSIS — I482 Chronic atrial fibrillation, unspecified: Secondary | ICD-10-CM | POA: Diagnosis not present

## 2021-09-03 DIAGNOSIS — K5793 Diverticulitis of intestine, part unspecified, without perforation or abscess with bleeding: Secondary | ICD-10-CM

## 2021-09-03 DIAGNOSIS — G9341 Metabolic encephalopathy: Secondary | ICD-10-CM | POA: Diagnosis not present

## 2021-09-03 LAB — BASIC METABOLIC PANEL
Anion gap: 8 (ref 5–15)
BUN: 15 mg/dL (ref 8–23)
CO2: 21 mmol/L — ABNORMAL LOW (ref 22–32)
Calcium: 7.6 mg/dL — ABNORMAL LOW (ref 8.9–10.3)
Chloride: 101 mmol/L (ref 98–111)
Creatinine, Ser: 0.75 mg/dL (ref 0.44–1.00)
GFR, Estimated: >60 mL/min (ref >60)
Glucose, Bld: 104 mg/dL — ABNORMAL HIGH (ref 70–99)
Potassium: 3.3 mmol/L — ABNORMAL LOW (ref 3.5–5.1)
Sodium: 130 mmol/L — ABNORMAL LOW (ref 135–145)

## 2021-09-03 LAB — CBC
HCT: 26.7 % — ABNORMAL LOW (ref 36.0–46.0)
Hemoglobin: 8.8 g/dL — ABNORMAL LOW (ref 12.0–15.0)
MCH: 31.4 pg (ref 26.0–34.0)
MCHC: 33 g/dL (ref 30.0–36.0)
MCV: 95.4 fL (ref 80.0–100.0)
Platelets: 184 10*3/uL (ref 150–400)
RBC: 2.8 MIL/uL — ABNORMAL LOW (ref 3.87–5.11)
RDW: 13.9 % (ref 11.5–15.5)
WBC: 8.2 10*3/uL (ref 4.0–10.5)
nRBC: 0 % (ref 0.0–0.2)

## 2021-09-03 LAB — URINE CULTURE: Culture: 10000 — AB

## 2021-09-03 LAB — MAGNESIUM: Magnesium: 1.6 mg/dL — ABNORMAL LOW (ref 1.7–2.4)

## 2021-09-03 MED ORDER — POTASSIUM CHLORIDE 20 MEQ PO PACK
40.0000 meq | PACK | ORAL | Status: AC
  Administered 2021-09-03 (×2): 40 meq via ORAL
  Filled 2021-09-03 (×2): qty 2

## 2021-09-03 MED ORDER — KATE FARMS STANDARD 1.4 PO LIQD
325.0000 mL | Freq: Two times a day (BID) | ORAL | Status: DC
  Administered 2021-09-04 – 2021-09-10 (×7): 325 mL via ORAL
  Filled 2021-09-03 (×17): qty 325

## 2021-09-03 MED ORDER — MAGNESIUM SULFATE 2 GM/50ML IV SOLN
2.0000 g | Freq: Once | INTRAVENOUS | Status: AC
  Administered 2021-09-03: 2 g via INTRAVENOUS
  Filled 2021-09-03: qty 50

## 2021-09-03 MED ORDER — SODIUM CHLORIDE (PF) 0.9 % IJ SOLN
INTRAMUSCULAR | Status: AC
  Filled 2021-09-03: qty 50

## 2021-09-03 MED ORDER — SODIUM CHLORIDE 0.9 % IV SOLN: INTRAVENOUS | Status: DC

## 2021-09-03 MED ORDER — IOHEXOL 9 MG/ML PO SOLN
ORAL | Status: AC
  Administered 2021-09-03: 500 mL
  Filled 2021-09-03: qty 1000

## 2021-09-03 MED ORDER — CHLORHEXIDINE GLUCONATE CLOTH 2 % EX PADS
6.0000 | MEDICATED_PAD | Freq: Every day | CUTANEOUS | Status: DC
  Administered 2021-09-03 – 2021-09-11 (×9): 6 via TOPICAL

## 2021-09-03 MED ORDER — PIPERACILLIN-TAZOBACTAM 3.375 G IVPB 30 MIN
3.3750 g | INTRAVENOUS | Status: AC
  Administered 2021-09-03: 3.375 g via INTRAVENOUS
  Filled 2021-09-03: qty 50

## 2021-09-03 MED ORDER — PIPERACILLIN-TAZOBACTAM 3.375 G IVPB
3.3750 g | Freq: Three times a day (TID) | INTRAVENOUS | Status: DC
  Administered 2021-09-03 – 2021-09-13 (×30): 3.375 g via INTRAVENOUS
  Filled 2021-09-03 (×30): qty 50

## 2021-09-03 MED ORDER — ORAL CARE MOUTH RINSE: 15.0000 mL | OROMUCOSAL | Status: DC | PRN

## 2021-09-03 MED ORDER — ALBUTEROL SULFATE (2.5 MG/3ML) 0.083% IN NEBU
2.5000 mg | INHALATION_SOLUTION | RESPIRATORY_TRACT | Status: DC | PRN
  Administered 2021-09-03: 2.5 mg via RESPIRATORY_TRACT
  Filled 2021-09-03: qty 3

## 2021-09-03 MED ORDER — METOPROLOL TARTRATE 25 MG PO TABS
12.5000 mg | ORAL_TABLET | Freq: Two times a day (BID) | ORAL | Status: DC
  Administered 2021-09-04 – 2021-09-13 (×18): 12.5 mg via ORAL
  Filled 2021-09-03 (×21): qty 1

## 2021-09-03 MED ORDER — FUROSEMIDE 10 MG/ML IJ SOLN
20.0000 mg | Freq: Once | INTRAMUSCULAR | Status: AC
  Administered 2021-09-03: 20 mg via INTRAVENOUS
  Filled 2021-09-03: qty 2

## 2021-09-03 MED ORDER — METOPROLOL TARTRATE 12.5 MG HALF TABLET: 12.5000 mg | ORAL_TABLET | Freq: Two times a day (BID) | ORAL | Status: DC

## 2021-09-03 MED ORDER — ALBUMIN HUMAN 25 % IV SOLN
12.5000 g | Freq: Once | INTRAVENOUS | Status: DC
  Filled 2021-09-03: qty 50

## 2021-09-03 MED ORDER — IOHEXOL 300 MG/ML  SOLN
100.0000 mL | Freq: Once | INTRAMUSCULAR | Status: AC | PRN
  Administered 2021-09-03: 100 mL via INTRAVENOUS

## 2021-09-03 NOTE — Progress Notes (Signed)
Initial Nutrition Assessment  INTERVENTION:   [employer]da [employer] 1.4 PO BID, each provides 455 kcals and 20g protein  -D/c Unjury Chicken Soup -contains whey  NUTRITION DIAGNOSIS:   Increased nutrient needs related to acute illness as evidenced by estimated needs.  GOAL:   Patient will meet greater than or equal to 90% of their needs  MONITOR:   PO intake, Supplement acceptance, Labs, Weight trends, I & O's  REASON FOR ASSESSMENT:   Malnutrition Screening Tool    ASSESSMENT:   87 y.o. patient with H/o chronic A-fib on Eliquis, hypertension, chronic UTIs presents the ER today with confusion and weakness.  Patient currently consuming full liquids. Noted allergy to beef derived and pork derived products. Have ordered plant-based protein supplement [employer] for additional kcals and protein. Per chart review, pt was having diarrhea PTA, was on antibiotics. Has had fluctuating appetite.   Per pt's family, has lost 15-20# over the past 6 months. Not able to verify in weight records.   Medications: KLOR-CON, IV Mg sulfate  Labs reviewed: Low Na Low K Low Mg   NUTRITION - FOCUSED PHYSICAL EXAM:  Unable to complete, RD working remotely.  Diet Order:   Diet Order             Diet full liquid Room service appropriate? Yes; Fluid consistency: Thin  Diet effective now                   EDUCATION NEEDS:   No education needs have been identified at this time  Skin:  Skin Assessment: Reviewed RN Assessment  Last BM:  8/5 -type 7  Height:   Ht Readings from Last 1 Encounters:  08/31/21 '5\' 5"'$  (1.651 m)    Weight:   Wt Readings from Last 1 Encounters:  08/31/21 67.1 kg    BMI:  Body mass index is 24.63 kg/m.  Estimated Nutritional Needs:   Kcal:  1700-1900  Protein:  80-90g  Fluid:  1.9L/day  Clayton Bibles, MS, RD, LDN Inpatient Clinical Dietitian Contact information available via Amion

## 2021-09-03 NOTE — Progress Notes (Signed)
[facility] ROUNDING NOTE   Subjective: Patient had been doing well until yesterday evening, when she experienced fairly abrupt change in mental status with fever, reported passage of a large liquid stool, elevated lactate.  Lactate is since normalized, and her mental status is back to normal, but she continues to feel poorly, worse than she did yesterday.  Overall she feels her abdominal pain is worse.  She has had very little appetite and very little p.o. intake, but denies nausea or vomiting. Antibiotics empirically broadened to Zosyn.  Plain film of the abdomen this morning negative for any free air.  Chest x-ray with left lower lobe atelectasis versus consolidation   Objective: Vital signs in last 24 hours: Temp:  [97.6 F (36.4 C)-102.9 F (39.4 C)] 98.6 F (37 C) (08/06 1300) Pulse Rate:  [86-107] 92 (08/06 1000) Resp:  [13-24] 17 (08/06 1000) BP: (99-140)/(46-73) 105/46 (08/06 1000) SpO2:  [94 %-100 %] 96 % (08/06 1000) Last BM Date : 09/01/21 General: NAD, ill-appearing, elderly Caucasian female, accompanied by son at bedside Lungs:  CTA b/l, no w/r/r Heart:  RRR, no m/r/g Abdomen:  Soft, diffuse tenderness to light palpation, no rigidity or guarding, ND, +BS Ext:  No c/c/e    Intake/Output from previous day: 08/05 0701 - 08/06 0700 In: 2127.8 [P.O.:580; I.V.:1403.8; IV Piggyback:144] Out: 1000 [Urine:1000] Intake/Output this shift: Total I/O In: 70.9 [I.V.:70.9] Out: -    Lab Results: Recent Labs    09/02/21 0447 09/02/21 1841 09/03/21 0247  WBC 8.1 8.1 8.2  HGB 9.5* 9.8* 8.8*  PLT 184 220 184  MCV 93.6 94.2 95.4   BMET Recent Labs    09/01/21 0351 09/02/21 0447 09/03/21 0247  NA 131* 129* 130*  K 3.5 2.9* 3.3*  CL 100 96* 101  CO2 20* 25 21*  GLUCOSE 83 148* 104*  BUN '18 14 15  '$ CREATININE 0.88 0.56 0.75  CALCIUM 7.9* 7.9* 7.6*   LFT Recent Labs    08/31/21 1603 09/01/21 0351 09/02/21 0447  PROT 6.8 5.2* 5.4*  ALBUMIN 2.8*  2.2* 2.2*  AST '27 22 22  '$ ALT 39 31 31  ALKPHOS 74 54 58  BILITOT 2.0* 1.5* 0.9   PT/INR Recent Labs    08/31/21 1609 09/01/21 0720  INR 2.0* 2.0*      Imaging/Other results: DG Abd 1 View  Result Date: 09/03/2021 CLINICAL DATA:  Abdominal pain. EXAM: ABDOMEN - 1 VIEW COMPARISON:  CT, 08/31/2021. FINDINGS: Normal bowel gas pattern. Status post cholecystectomy. No evidence of renal or ureteral stones. No acute skeletal abnormality. Left lung base opacity consistent with atelectasis. Moderate hiatal hernia. IMPRESSION: 1. No acute findings.  No evidence of bowel obstruction. Electronically Signed   By: Lajean Manes M.D.   On: 09/03/2021 09:43   DG CHEST PORT 1 VIEW  Result Date: 09/02/2021 CLINICAL DATA:  Wheezing. EXAM: PORTABLE CHEST 1 VIEW COMPARISON:  August 31, 2021 FINDINGS: Enlarged cardiac silhouette. Large hiatal hernia causes compression of the left lower lobe. Left lower lobe atelectasis versus airspace consolidation. The right lung is clear. Osseous structures are without acute abnormality. Soft tissues are grossly normal. IMPRESSION: 1. Enlarged cardiac silhouette. Please note that the patient has a pericardial effusion measuring up to 7 mm, demonstrated on the prior abdominal CT dated August 31, 2021. Further evaluation with cardiac echo may be considered. 2. Large hiatal hernia causes compression of the left lower lobe. 3. Left lower lobe atelectasis versus airspace consolidation. Electronically Signed   By: Fidela Salisbury  M.D.   On: 09/02/2021 18:30   DG Knee 1-2 Views Left  Result Date: 09/01/2021 CLINICAL DATA:  Left knee pain. EXAM: LEFT KNEE - 1-2 VIEW COMPARISON:  None Available. FINDINGS: There is diffuse decreased bone mineralization. Mild medial compartment of the knee joint space narrowing. Tiny superior patellar degenerative osteophyte. Tiny joint effusion. No acute fracture is seen. No dislocation. IMPRESSION: Minimal medial and patellofemoral compartment  osteoarthritis. No acute fracture is seen. Electronically Signed   By: Yvonne Kendall M.D.   On: 09/01/2021 15:08      Assessment and Plan:  Acute sigmoid diverticulitis. No abscess on CT 8/3, although patient experienced clinical deterioration yesterday concerning for development of abscess, CT pending LGI bleed-likely diverticular vs hemorrhoidal vs both. Hb stable.  Appears to have resolved. Last colon 2017 Chronic A-fib on Eliquis (last dose 8/3)   Plan: -Liquid diet.  OK to have Ensure for now. -Continue HC 2.5% suppositories -Follow-up results CT scan today -If any active brisk bleeding, CTA followed by IR embolization. -No need for repeat colonoscopy at this time. -D/W pt and pt's son    Daryel November, MD  09/03/2021, 1:37 PM [facility]

## 2021-09-03 NOTE — Progress Notes (Addendum)
PROGRESS NOTE    Caitlin Chavez  NAT:[id number] DOB: 01/12/1935 DOA: 08/31/2021 PCP: Sueanne Margarita, DO     Brief Narrative:   H/o chronic A-fib on Eliquis, hypertension, chronic UTIs presents the ER today with confusion and weakness.  Son states the patient was treated with multiple antibiotics about a week ago for E. coli.  Patient was ultimately placed on Keflex after urine cultures grew out E. coli.  Patient is also been having chronic neck pain for about the last month.  She has been on multiple rounds of steroids which are only partially beneficial.  She is ready had 2 injections of IM steroids by her PCP.  She was sent to orthopedics for consideration of injection.  This was supposed to be today.  Patient never made it to the clinic today due to lethargy.   Patient's been having some slight abdominal pain.  She has been having diarrhea for the last couple days.  Denies any fever.    Subjective:  She developed fever, sudden onset of decreased responsiveness on 8/5 pm around 6pm, sepsis protocal started, abx broadened , she was transferred to ICU, she appear has improved , lactic acid normalized on repeat, fever coming down, hgb dropped this am from 9.8  to 8.8, no reported overt bleeding,  she did received fluids resuscitation for sepsis ,  No confusion this am, continue to report ab pain, no n/v, tolerating meds and liquids  RN reports urinary retention  lat night, 1liter urine drained with in and out cath Son at bedside   Assessment & Plan:  Principal Problem:   Acute metabolic encephalopathy Active Problems:   Generalized weakness   Essential hypertension   Chronic a-fib (HCC)   Sigmoid diverticulitis   Chronic anticoagulation - on eliquis for afib   Cervical pain (neck)   Hypokalemia   GI bleed    Assessment and Plan:  Acute metabolic encephalopathy, presents on admission  -Blood culture no growth so far, chest x-ray no acute findings, UA no sign of  infection (  urine culture + 10,000 enterococcus, >100,000 lactobacillus, not sure if contamination or colonization, current abx should cover both though) -Confusion likely due to diverticulitis, electrolyte abnormalities -Resolved, AAOx3  Sepsis developed on 8/5 pm Abx broadened to zosyn Wbc remain stable, continue to have ab pain, appear weak, kub no acute findings, will repeat CT ab/pel with oral and iv contrast, case discussed with GI  PM Addendum: Ct ab/pel showed noncomplicated diverticulitis, atelectasis and small left pleural effusion Family reports patient appear to have generalized edema today, she started to wheeze some, her blood pressure has been stable, lactic acid normalized, will try low dose lasix, ( initially plan to give albumin with lasix but there is one case report regarding albumin cause anaphylaxis reaction in patient with alpha gal, so did not give albumin) Echocardiogram ordered   Acute diverticulitis CT obtained in the ED showed uncomplicated sigmoid diverticulitis initially, repeat ct on 8/6 ordered  Received cefepime and vancomycin in the ED, then on Keflex and Flagyl, abx changed to zosyn due to developing sepsis on 8/5.  Acute blood loss anemia -had BRBPR  -son reports patient has been having darker stool for hte past week at home ( recent steroids exposure) -she c/o LLQ pain, no epigastric pain currently --Started on Protonix twice a day  -Hold Eliquis -h/h down trending, GI consulted, will follow recommendation  Hyponatremia Hypokalemia Hypomagnesemia -Likely from poor oral intake, dehydration, hold HCTZ -received hydration, replace K and  mag, recheck in the morning  HTN, developed sepsis, hold  Coreg,  hold HCTZ, resume when appropriate  PAF, currently sinus rhythm few nonsustained V. Tach -coreg hold due to sepsis, low dose lopressor with holding parameters -Secondary hypercoagulable state from afib ,hold Eliquis in the setting of GI bleed -Keep K above 4, mag  above 2 -Continue telemetry   Cervical pain (neck) -CT c spine:  No acute fracture, does has  Multilevel degenerative changes.- - prn norco ordered at family's request -resume gabapentin at low dose, titrate up if able to tolerate  -Reported lidocaine patch helped ,currently no neck pain in this position, reports neck pain is positional , no weakness in arms, no numbness or tingling in arms -was scheduled to see [facility] on day of admission but end up in the hospital, Contacted [facility] at family 's request, [facility] recommended outpatient follow up.   FTT: Family reports patient at [facility] able to  ambulate independently , lives byherself, family comes in daily , she does not drive , no recent falls , became weak in the last few days  Son reports patient has poor appetite and 15lbs weight loss in last 3-6 months Seen by PT eval who recommend SNF,  family agrees SNF placement    Body mass index is 24.63 kg/m.      I have Reviewed nursing notes, Vitals, pain scores, I/o's, Lab results and  imaging results since pt's last encounter, details please see discussion above  I ordered the following labs:  Unresulted Labs (From admission, onward)     Start     Ordered   09/02/21 0500  CBC with Differential/Platelet  Tomorrow morning,   R       Question:  Specimen collection method  Answer:  Lab=Lab collect   09/01/21 1733   09/02/21 0500  Comprehensive metabolic panel  Tomorrow morning,   R       Question:  Specimen collection method  Answer:  Lab=Lab collect   09/01/21 1733   09/02/21 0500  Magnesium  Tomorrow morning,   R       Question:  Specimen collection method  Answer:  Lab=Lab collect   09/01/21 1733                                     DVT prophylaxis: SCDs Start: 09/01/21 0029   Code Status:   Code Status: DNR  Family Communication: Family at bedside daily Disposition:    Dispo: The patient is from: Home              Anticipated d/c is to:  SNF  sometime next week, currently remain in stepdown.                 Antimicrobials:    Anti-infectives (From admission, onward)    Start     Dose/Rate Route Frequency Ordered Stop   09/03/21 1600  piperacillin-tazobactam (ZOSYN) IVPB 3.375 g        3.375 g 12.5 mL/hr over 240 Minutes Intravenous Every 8 hours 09/03/21 1003     09/03/21 1100  piperacillin-tazobactam (ZOSYN) IVPB 3.375 g        3.375 g 100 mL/hr over 30 Minutes Intravenous NOW 09/03/21 1000 09/03/21 1034   09/02/21 1830  piperacillin-tazobactam (ZOSYN) IVPB 3.375 g  Status:  Discontinued        3.375 g 12.5 mL/hr over 240 Minutes  Intravenous Every 8 hours 09/02/21 1816 09/03/21 1002   09/01/21 0800  cephALEXin (KEFLEX) capsule 500 mg  Status:  Discontinued        500 mg Oral Every 12 hours 09/01/21 0028 09/02/21 1803   09/01/21 0800  metroNIDAZOLE (FLAGYL) IVPB 500 mg  Status:  Discontinued        500 mg 100 mL/hr over 60 Minutes Intravenous Every 12 hours 09/01/21 0028 09/02/21 1816   08/31/21 1745  vancomycin (VANCOREADY) IVPB 1500 mg/300 mL        1,500 mg 150 mL/hr over 120 Minutes Intravenous  Once 08/31/21 1726 08/31/21 2040   08/31/21 1730  ceFEPIme (MAXIPIME) 2 g in sodium chloride 0.9 % 100 mL IVPB        2 g 200 mL/hr over 30 Minutes Intravenous  Once 08/31/21 1726 08/31/21 1836          Objective: Vitals:   09/03/21 0400 09/03/21 0700 09/03/21 0800 09/03/21 0827  BP:  (!) 123/56 (!) 123/59   Pulse:  90 94   Resp:  14 17   Temp: (!) 100.8 F (38.2 C)   97.6 F (36.4 C)  TempSrc: Axillary   Oral  SpO2:  95% 96%   Weight:      Height:        Intake/Output Summary (Last 24 hours) at 09/03/2021 1055 Last data filed at 09/03/2021 0800 Gross per 24 hour  Intake 2098.69 ml  Output 1000 ml  Net 1098.69 ml   Filed Weights   08/31/21 1458  Weight: 67.1 kg    Examination:  General exam: Weak, frail, appear chronically ill, AAOx3 Respiratory system: Diminished at bases, no wheezing, no rales, no  rhonchi, respiratory effort normal. Cardiovascular system:  RRR.  Gastrointestinal system: Abdomen is nondistended, diffuse tenderness, no guarding, normal bowel sounds heard. Central nervous system: Alert and oriented. No focal neurological deficits. Extremities:  no edema Skin: No rashes, lesions or ulcers Psychiatry: Judgement and insight appear normal. Mood & affect appropriate.     Data Reviewed: I have personally reviewed  labs and visualized  imaging studies since the last encounter and formulate the plan        Scheduled Meds:  Chlorhexidine Gluconate Cloth  6 each Topical Daily   gabapentin  100 mg Oral BID   iohexol       lidocaine  1 patch Transdermal Q24H   melatonin  3 mg Oral QHS   metoprolol tartrate  12.5 mg Oral BID   pantoprazole (PROTONIX) IV  40 mg Intravenous Q12H   potassium chloride  40 mEq Oral Q4H   protein supplement  2 oz Oral QID   Continuous Infusions:  sodium chloride 75 mL/hr at 09/03/21 0904   piperacillin-tazobactam (ZOSYN)  IV       LOS: 2 days     Florencia Reasons, MD PhD FACP [facility]  Available via Epic secure chat 7am-7pm for nonurgent issues Please page for urgent issues To page the attending provider between 7A-7P or the covering provider during after hours 7P-7A, please log into the web site [email or web address] and access using universal [place] password for that web site. If you do not have the password, please call the hospital operator.    09/03/2021, 10:55 AM

## 2021-09-03 NOTE — Evaluation (Signed)
Occupational Therapy Evaluation Patient Details Name: Caitlin Chavez MRN: [id number] DOB: 05/06/1934 Today's Date: 09/03/2021   History of Present Illness 87 year old female history of chronic A-fib on Eliquis, hypertension, chronic UTIs presents the ER today with confusion and weakness. She has been getting injections for neck pain recently. Sons report significant decline in her function in the last month. patient was transitioned to stepdown unit on 8/6 with sepsis protocol initated.   Clinical Impression   Patient is a 87 year old female who was living at home independently with son who would visit for about 4 hours each day. Patient was max A for self feeding on this date with increased fatigue with effort. Patient was noted to have decreased functional activity tolernace, decreased ROM, decreased BUE strength, decreased endurance, decreased sitting balance, decreased standing balanced, decreased safety awareness, and decreased knowledge of AE/AD impacting participation in ADLs. Patient would continue to benefit from skilled OT services at this time while admitted and after d/c to address noted deficits in order to improve overall safety and independence in ADLs.        Recommendations for follow up therapy are one component of a multi-disciplinary discharge planning process, led by the attending physician.  Recommendations may be updated based on patient status, additional functional criteria and insurance authorization.   Follow Up Recommendations  Skilled nursing-short term rehab (<3 hours/day)    Assistance Recommended at Discharge Frequent or constant Supervision/Assistance  Patient can return home with the following Two people to help with walking and/or transfers;Two people to help with bathing/dressing/bathroom;Direct supervision/assist for medications management;Help with stairs or ramp for entrance;Assist for transportation;Direct supervision/assist for financial  management;Assistance with cooking/housework;Assistance with feeding    Functional Status Assessment  Patient has had a recent decline in their functional status and demonstrates the ability to make significant improvements in function in a reasonable and predictable amount of time.  Equipment Recommendations  Other (comment) (defer to next venue)    Recommendations for Other Services       Precautions / Restrictions Precautions Precautions: Fall Restrictions Weight Bearing Restrictions: No      Mobility Bed Mobility               General bed mobility comments: patient declined to move with reports of pain in neck and fatigue from todays events.    Transfers                          Balance                                           ADL either performed or assessed with clinical judgement   ADL Overall ADL's : Needs assistance/impaired Eating/Feeding: Maximal assistance;Bed level Eating/Feeding Details (indicate cue type and reason): patients son was feeding patietn with patietn mostyl flat in bed. patient  and son were educated on serious concerns with intake at a flat level. patient was adjusted to upright position slowly and educated on importance of attempting to engage in self feeding tasks prior to getting nursing or son to complete. patients son was educated on letting patient participate first then finishing for her to ensure intake. patients son verbalized understanding. patient was able to scoop three spoonfulls of soup to mouth with increased time and almost falling asleep between each bite. patient was also noted to have tremor like  movement with movement with HHA to steady hand to ensure food stayed on spoon to get to mouth. patient was educated on weights with patient reporting she would try them next session. patient too fatigued to attempt to sit EOB at this time.red foam was added to spoon to increase patients ability to hold onto  utensil. patient reported it was easier to hold after application. Grooming: Maximal assistance;Bed level   Upper Body Bathing: Bed level;Total assistance   Lower Body Bathing: Total assistance;Bed level   Upper Body Dressing : Bed level;Total assistance   Lower Body Dressing: Bed level;Total assistance   Toilet Transfer: +2 for physical assistance;+2 for safety/equipment   Toileting- Clothing Manipulation and Hygiene: Bed level;Total assistance               Vision   Additional Comments: neck limitations are impacting vision of seeing items on tray     Perception     Praxis      Pertinent Vitals/Pain Pain Assessment Pain Assessment: Faces Faces Pain Scale: Hurts even more Pain Location: neck Pain Descriptors / Indicators: Grimacing Pain Intervention(s): Limited activity within patient's tolerance, Monitored during session, Repositioned     Hand Dominance Right   Extremity/Trunk Assessment Upper Extremity Assessment Upper Extremity Assessment: Generalized weakness (patient was noted to have edema in BUE with patient able to lift RUE up to self feed with no issues. patient was able to clear LUE from under blankets with no issues as well.unable to complete formal assessment with patients eagerness to attempt to eat)   Lower Extremity Assessment Lower Extremity Assessment: Defer to PT evaluation   Cervical / Trunk Assessment Cervical / Trunk Assessment: Other exceptions Cervical / Trunk Exceptions: patient was noted to have a lateral tilt and slight rotation of neck to the R side with patients son reporting this being new onset in last few weeks.   Communication Communication Communication: No difficulties   Cognition Arousal/Alertness: Lethargic Behavior During Therapy: WFL for tasks assessed/performed Overall Cognitive Status: Difficult to assess                                 General Comments: fatigue limited assessment with son in room to  provide PLOF as well.     General Comments  BP was 156/74 mmhg in room after eating. patient was noted to have increased edema in BUE impacting BUE use as well.    Exercises     Shoulder Instructions      Home Living Family/patient expects to be discharged to:: Skilled nursing facility Living Arrangements: Alone                                      Prior Functioning/Environment Prior Level of Function : Independent/Modified Independent             Mobility Comments: 4 weeks ago was ambulatory without an AD.  Has progressed to using cane and more recently her family felt she should have been using a RW. ADLs Comments: independent in everything per patient and son report. son reported coming over to see her for 4 hours each day        OT Problem List: Decreased activity tolerance;Impaired balance (sitting and/or standing);Decreased safety awareness;Impaired UE functional use;Decreased knowledge of precautions;Decreased knowledge of use of DME or AE;Cardiopulmonary status limiting activity  OT Treatment/Interventions: Self-care/ADL training;Therapeutic exercise;Neuromuscular education;Energy conservation;DME and/or AE instruction;Therapeutic activities;Balance training;Patient/family education    OT Goals(Current goals can be found in the care plan section) Acute Rehab OT Goals Patient Stated Goal: to get better OT Goal Formulation: With patient/family Time For Goal Achievement: 09/17/21 Potential to Achieve Goals: Fair  OT Frequency: Min 2X/week    Co-evaluation              AM-PAC OT "6 Clicks" Daily Activity     Outcome Measure Help from another person eating meals?: A Lot Help from another person taking care of personal grooming?: Total Help from another person toileting, which includes using toliet, bedpan, or urinal?: Total Help from another person bathing (including washing, rinsing, drying)?: Total Help from another person to put on and  taking off regular upper body clothing?: Total Help from another person to put on and taking off regular lower body clothing?: Total 6 Click Score: 7   End of Session Nurse Communication: Other (comment) (ok to participate, updated on participation after.)  Activity Tolerance: Patient tolerated treatment well Patient left: in bed;with family/visitor present;with call bell/phone within reach;with nursing/sitter in room  OT Visit Diagnosis: Unsteadiness on feet (R26.81);Other abnormalities of gait and mobility (R26.89);Muscle weakness (generalized) (M62.81);Feeding difficulties (R63.3)                Time: 3818-2993 OT Time Calculation (min): 23 min Charges:  OT General Charges $OT Visit: 1 Visit OT Evaluation $OT Eval Moderate Complexity: 1 Mod OT Treatments $Self Care/Home Management : 8-22 mins  Jackelyn Poling OTR/L, MS Acute Rehabilitation Department Office# [phone] Pager# [phone]   Marcellina Millin 09/03/2021, 3:54 PM

## 2021-09-04 ENCOUNTER — Inpatient Hospital Stay (HOSPITAL_COMMUNITY): Payer: Medicare Other

## 2021-09-04 DIAGNOSIS — R197 Diarrhea, unspecified: Secondary | ICD-10-CM | POA: Diagnosis not present

## 2021-09-04 DIAGNOSIS — K5732 Diverticulitis of large intestine without perforation or abscess without bleeding: Secondary | ICD-10-CM | POA: Diagnosis not present

## 2021-09-04 DIAGNOSIS — I3139 Other pericardial effusion (noninflammatory): Secondary | ICD-10-CM

## 2021-09-04 DIAGNOSIS — G9341 Metabolic encephalopathy: Secondary | ICD-10-CM | POA: Diagnosis not present

## 2021-09-04 LAB — CBC WITH DIFFERENTIAL/PLATELET
Abs Immature Granulocytes: 0.1 10*3/uL — ABNORMAL HIGH (ref 0.00–0.07)
Basophils Absolute: 0 10*3/uL (ref 0.0–0.1)
Basophils Relative: 0 %
Eosinophils Absolute: 0.1 10*3/uL (ref 0.0–0.5)
Eosinophils Relative: 1 %
HCT: 28.9 % — ABNORMAL LOW (ref 36.0–46.0)
Hemoglobin: 9.5 g/dL — ABNORMAL LOW (ref 12.0–15.0)
Immature Granulocytes: 1 %
Lymphocytes Relative: 9 %
Lymphs Abs: 0.8 10*3/uL (ref 0.7–4.0)
MCH: 30.9 pg (ref 26.0–34.0)
MCHC: 32.9 g/dL (ref 30.0–36.0)
MCV: 94.1 fL (ref 80.0–100.0)
Monocytes Absolute: 1.1 10*3/uL — ABNORMAL HIGH (ref 0.1–1.0)
Monocytes Relative: 11 %
Neutro Abs: 7.5 10*3/uL (ref 1.7–7.7)
Neutrophils Relative %: 78 %
Platelets: 193 10*3/uL (ref 150–400)
RBC: 3.07 MIL/uL — ABNORMAL LOW (ref 3.87–5.11)
RDW: 14.1 % (ref 11.5–15.5)
WBC: 9.7 10*3/uL (ref 4.0–10.5)
nRBC: 0 % (ref 0.0–0.2)

## 2021-09-04 LAB — OSMOLALITY: Osmolality: 265 mOsm/kg — ABNORMAL LOW (ref 275–295)

## 2021-09-04 LAB — BASIC METABOLIC PANEL
Anion gap: 9 (ref 5–15)
BUN: 14 mg/dL (ref 8–23)
CO2: 22 mmol/L (ref 22–32)
Calcium: 7.9 mg/dL — ABNORMAL LOW (ref 8.9–10.3)
Chloride: 96 mmol/L — ABNORMAL LOW (ref 98–111)
Creatinine, Ser: 0.96 mg/dL (ref 0.44–1.00)
GFR, Estimated: 58 mL/min — ABNORMAL LOW (ref >60)
Glucose, Bld: 113 mg/dL — ABNORMAL HIGH (ref 70–99)
Potassium: 3.6 mmol/L (ref 3.5–5.1)
Sodium: 127 mmol/L — ABNORMAL LOW (ref 135–145)

## 2021-09-04 LAB — ECHOCARDIOGRAM COMPLETE
Area-P 1/2: 3.86 cm2
Calc EF: 71.2 %
Height: 65 in
MV VTI: 1.81 cm2
S' Lateral: 2.2 cm
Single Plane A2C EF: 72.8 %
Single Plane A4C EF: 73.6 %
Weight: 2368 oz

## 2021-09-04 LAB — MAGNESIUM: Magnesium: 2 mg/dL (ref 1.7–2.4)

## 2021-09-04 LAB — SODIUM, URINE, RANDOM: Sodium, Ur: 32 mmol/L

## 2021-09-04 LAB — OSMOLALITY, URINE: Osmolality, Ur: 474 mOsm/kg (ref 300–900)

## 2021-09-04 MED ORDER — ALBUMIN HUMAN 25 % IV SOLN: 25.0000 g | Freq: Two times a day (BID) | INTRAVENOUS | Status: DC

## 2021-09-04 NOTE — Progress Notes (Signed)
Daily Progress Note  Hospital Day: 5  Chief Complaint: abdominal pain, diarrhea, rectal bleeding  Brief History Caitlin Chavez is a 87 y.o. female with a pmh not limited to Afib on Eliquis, HTN, chronic UTIs, chronic neck pain. Admitted with diverticulitis and altered mental status.    Assessment / Plan   # 87 yo female with sigmoid diverticulitis, uncomplicated. She was doing well until yesterday when she developed mental status changes, fever and abdominal pain. Repeat CT scan yesterday showed mild uncomplicated sigmoid diverticulitis, unchanged from prior CT scan. No abscess. No extraluminal or free air. Afebrile, normal WBC On day # 2 of Zosyn Will advance from full liquids to soft though she is apparently eating very little.   # Diarrhea, non-bloody. Spoke with son outside room who is very concerned about the diarrhea ( it sounds like she has been having frequent BMs at home, mainly after meals but it wasn't diarrhea). She does have a history of giardia. Antibiotics could also be contributing.  Will check stools studies but sound more osmotic in nature hearing from RN that diarrhea usually occurs after eating. If negative will consider imodium A flexiseal was placed this am. Right now there is no output.    # Rectal bleeding ( started after admission).  Also with reports of dark stools at home over preceding weeks. Bleeding possibly hemorrhoidal.  Bleeding has resolved.  Colonoscopy not recommended in setting of diverticulitis. If recurrent bleeding then will need CTA   Continue BID PPI in setting of darker stools / prednisone + NSAID use  # Chronic [place] anemia. Hgb remains stable at 9.5. Hard to really know what her baseline is. It was 11.5 on admission    # Acute metabolic encephalopathy.    Subjective   She denies abdominal pain. She has no appetite  Objective   Imaging:  CT ABDOMEN PELVIS W CONTRAST  Result Date: 09/03/2021 CLINICAL DATA:  Abdominal pain.   Lethargy and weakness. EXAM: CT ABDOMEN AND PELVIS WITH CONTRAST TECHNIQUE: Multidetector CT imaging of the abdomen and pelvis was performed using the standard protocol following bolus administration of intravenous contrast. RADIATION DOSE REDUCTION: This exam was performed according to the departmental dose-optimization program which includes automated exposure control, adjustment of the mA and/or kV according to patient size and/or use of iterative reconstruction technique. CONTRAST:  16m OMNIPAQUE IOHEXOL 300 MG/ML  SOLN COMPARISON:  08/31/2021. FINDINGS: Lower chest: Dependent lung base opacities, most evident in the left lower lobe adjacent to the moderate to large hiatal hernia. Trace pericardial effusion. Trace left pleural effusion. Hepatobiliary: No focal liver abnormality is seen. Status post cholecystectomy. No biliary dilatation. 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 This was not clearly present on a CT from 10/28/2018, and not present on the CT from 11/21/2015. Is no other pancreatic abnormality. No inflammation. Spleen: Normal in size without focal abnormality. Adrenals/Urinary Tract: No adrenal mass. Kidneys normal in size, orientation and position. 7 mm hypoattenuating cortical mass along the midpole the right kidney consistent with a cyst, stable with no follow-up recommended. No other renal masses or lesions, no stones and no hydronephrosis. Normal ureters. Normal bladder. Stomach/Bowel: Mild wall thickening with subtle adjacent inflammation of the proximal sigmoid colon, where there are multiple diverticula, without significant change from the recent prior exam. No extraluminal or free air. No fluid collection to suggest an  abscess. Remainder of the colon with no additional areas of wall thickening or inflammation. Large hiatal hernia. Stomach otherwise unremarkable.  Normal small bowel. Normal appendix. Vascular/Lymphatic: Aortic atherosclerosis. No aneurysm. No enlarged lymph nodes. Reproductive: Status post hysterectomy. No adnexal masses. Other: Small fat containing periumbilical hernia, stable. No ascites. Musculoskeletal: No fracture or acute finding.  No bone lesion. IMPRESSION: 1. Mild uncomplicated sigmoid diverticulitis, unchanged from the CT performed on 08/31/2021. No abscess. No extraluminal or free air. 2. Lung base opacities, left greater than right, consistent with atelectasis. This is similar to the prior exam. Trace left pleural effusion is new from the prior study. 3. No other acute abnormality within the abdomen or pelvis. Electronically Signed   By: Lajean Manes M.D.   On: 09/03/2021 14:36   DG Abd 1 View  Result Date: 09/03/2021 CLINICAL DATA:  Abdominal pain. EXAM: ABDOMEN - 1 VIEW COMPARISON:  CT, 08/31/2021. FINDINGS: Normal bowel gas pattern. Status post cholecystectomy. No evidence of renal or ureteral stones. No acute skeletal abnormality. Left lung base opacity consistent with atelectasis. Moderate hiatal hernia. IMPRESSION: 1. No acute findings.  No evidence of bowel obstruction. Electronically Signed   By: Lajean Manes M.D.   On: 09/03/2021 09:43   DG CHEST PORT 1 VIEW  Result Date: 09/02/2021 CLINICAL DATA:  Wheezing. EXAM: PORTABLE CHEST 1 VIEW COMPARISON:  August 31, 2021 FINDINGS: Enlarged cardiac silhouette. Large hiatal hernia causes compression of the left lower lobe. Left lower lobe atelectasis versus airspace consolidation. The right lung is clear. Osseous structures are without acute abnormality. Soft tissues are grossly normal. IMPRESSION: 1. Enlarged cardiac silhouette. Please note that the patient has a pericardial effusion measuring up to 7 mm, demonstrated on the prior abdominal CT dated August 31, 2021. Further evaluation with cardiac echo may be considered. 2. Large hiatal hernia causes compression of the left lower lobe. 3.  Left lower lobe atelectasis versus airspace consolidation. Electronically Signed   By: Fidela Salisbury M.D.   On: 09/02/2021 18:30   DG Knee 1-2 Views Left  Result Date: 09/01/2021 CLINICAL DATA:  Left knee pain. EXAM: LEFT KNEE - 1-2 VIEW COMPARISON:  None Available. FINDINGS: There is diffuse decreased bone mineralization. Mild medial compartment of the knee joint space narrowing. Tiny superior patellar degenerative osteophyte. Tiny joint effusion. No acute fracture is seen. No dislocation. IMPRESSION: Minimal medial and patellofemoral compartment osteoarthritis. No acute fracture is seen. Electronically Signed   By: Yvonne Kendall M.D.   On: 09/01/2021 15:08   CT CERVICAL SPINE WO CONTRAST  Result Date: 09/01/2021 CLINICAL DATA:  Chronic neck pain. EXAM: CT CERVICAL SPINE WITHOUT CONTRAST TECHNIQUE: Multidetector CT imaging of the cervical spine was performed without intravenous contrast. Multiplanar CT image reconstructions were also generated. RADIATION DOSE REDUCTION: This exam was performed according to the departmental dose-optimization program which includes automated exposure control, adjustment of the mA and/or kV according to patient size and/or use of iterative reconstruction technique. COMPARISON:  None Available. FINDINGS: Alignment: There is mild anterolisthesis at C5-C6. Skull base and vertebrae: No acute fracture. No primary bone lesion or focal pathologic process. Soft tissues and spinal canal: No prevertebral fluid or swelling. No visible canal hematoma. Disc levels: Multilevel intervertebral disc space narrowing, uncovertebral osteophyte formation and facet arthropathy is noted bilaterally resulting in mild spinal canal stenosis. There is mild to moderate neural foraminal narrowing at C3-C4 and C4-C5. Upper chest: Negative. Other: None. IMPRESSION: 1. No acute fracture. 2. Multilevel degenerative changes as detailed above. Electronically Signed  By: Brett Fairy M.D.   On: 09/01/2021  00:04   CT ABDOMEN PELVIS W CONTRAST  Result Date: 08/31/2021 CLINICAL DATA:  Abdominal pain. EXAM: CT ABDOMEN AND PELVIS WITH CONTRAST TECHNIQUE: Multidetector CT imaging of the abdomen and pelvis was performed using the standard protocol following bolus administration of intravenous contrast. RADIATION DOSE REDUCTION: This exam was performed according to the departmental dose-optimization program which includes automated exposure control, adjustment of the mA and/or kV according to patient size and/or use of iterative reconstruction technique. CONTRAST:  114m OMNIPAQUE IOHEXOL 300 MG/ML  SOLN COMPARISON:  October 28, 2018 FINDINGS: Lower chest: Mild atelectasis is seen within the bilateral lung bases. A small pericardial effusion is seen. Hepatobiliary: No focal liver abnormality is seen. Status post cholecystectomy. No biliary dilatation. Pancreas: Unremarkable. No pancreatic ductal dilatation or surrounding inflammatory changes. Spleen: Normal in size without focal abnormality. Adrenals/Urinary Tract: Adrenal glands are unremarkable. Kidneys are normal in size, without renal calculi or hydronephrosis. An 11 mm diameter simple cyst is seen within the anterior aspect of the mid right kidney. No additional follow-up or imaging is recommended. Bladder is unremarkable. Stomach/Bowel: There is a large hiatal hernia. Appendix appears normal. No evidence of bowel dilatation. Noninflamed diverticula are seen throughout the large bowel. Mildly thickened and inflamed distal descending and proximal sigmoid colon is also noted. Vascular/Lymphatic: Aortic atherosclerosis. No enlarged abdominal or pelvic lymph nodes. Reproductive: Status post hysterectomy. No adnexal masses. Other: A 3.3 cm x 2.5 cm fat containing umbilical hernia is seen. No abdominopelvic ascites. Musculoskeletal: Multilevel degenerative changes are seen throughout the lumbar spine. IMPRESSION: 1. Mild acute diverticulitis involving the distal  descending and proximal sigmoid colon. 2. Evidence of prior cholecystectomy and hysterectomy. 3. Aortic atherosclerosis. Aortic Atherosclerosis (ICD10-I70.0). Electronically Signed   By: TVirgina NorfolkM.D.   On: 08/31/2021 20:42   DG Chest Port 1 View  Result Date: 08/31/2021 CLINICAL DATA:  Questionable sepsis. EXAM: PORTABLE CHEST 1 VIEW COMPARISON:  Chest x-ray 12/09/2018.  Chest CT 08/12/2015 FINDINGS: The heart size and mediastinal contours are within normal limits. Both lungs are clear. The visualized skeletal structures are unremarkable. Large hiatal hernia is unchanged. IMPRESSION: No active disease. Electronically Signed   By: ARonney AstersM.D.   On: 08/31/2021 16:57    Lab Results: Recent Labs    09/02/21 1841 09/03/21 0247 09/04/21 0234  WBC 8.1 8.2 9.7  HGB 9.8* 8.8* 9.5*  HCT 29.1* 26.7* 28.9*  PLT 220 184 193   BMET Recent Labs    09/02/21 0447 09/03/21 0247 09/04/21 0234  NA 129* 130* 127*  K 2.9* 3.3* 3.6  CL 96* 101 96*  CO2 25 21* 22  GLUCOSE 148* 104* 113*  BUN '14 15 14  '$ CREATININE 0.56 0.75 0.96  CALCIUM 7.9* 7.6* 7.9*   LFT Recent Labs    09/02/21 0447  PROT 5.4*  ALBUMIN 2.2*  AST 22  ALT 31  ALKPHOS 58  BILITOT 0.9   PT/INR No results for input(s): "LABPROT", "INR" in the last 72 hours.   Scheduled inpatient medications:   Chlorhexidine Gluconate Cloth  6 each Topical Daily   feeding supplement (KATE FARMS STANDARD 1.4)  325 mL Oral BID BM   gabapentin  100 mg Oral BID   lidocaine  1 patch Transdermal Q24H   melatonin  3 mg Oral QHS   metoprolol tartrate  12.5 mg Oral BID   pantoprazole (PROTONIX) IV  40 mg Intravenous Q12H   Continuous inpatient infusions:   piperacillin-tazobactam (  ZOSYN)  IV 3.375 g (09/04/21 0809)   PRN inpatient medications: acetaminophen **OR** acetaminophen, albuterol, HYDROcodone-acetaminophen, lidocaine, ondansetron **OR** ondansetron (ZOFRAN) IV, mouth rinse  Vital signs in last 24 hours: Temp:  [97.8  F (36.6 C)-100.9 F (38.3 C)] 98.2 F (36.8 C) (08/07 0800) Pulse Rate:  [86-114] 114 (08/07 0800) Resp:  [16-25] 21 (08/07 0800) BP: (87-147)/(55-92) 132/70 (08/07 0800) SpO2:  [95 %-99 %] 95 % (08/07 0800) Last BM Date : 09/03/21  Intake/Output Summary (Last 24 hours) at 09/04/2021 1140 Last data filed at 09/04/2021 0400 Gross per 24 hour  Intake 528.4 ml  Output 400 ml  Net 128.4 ml     Physical Exam:  General: Alert female in NAD Heart:  Regular rate and rhythm.  Pulmonary: Normal respiratory effort Abdomen: Soft, nondistended, moderate LLQ tenderness even with light palpation. Normal bowel sounds.  Neurologic: Alert and oriented Psych: Pleasant. Cooperative.    Intake/Output from previous day: 08/06 0701 - 08/07 0700 In: 599.3 [I.V.:401.9; IV Piggyback:197.4] Out: 400 [Urine:400] Intake/Output this shift: No intake/output data recorded.    Principal Problem:   Acute metabolic encephalopathy Active Problems:   Generalized weakness   Essential hypertension   Chronic a-fib (HCC)   Sigmoid diverticulitis   Chronic anticoagulation - on eliquis for afib   Cervical pain (neck)   Hypokalemia   GI bleed     LOS: 3 days   Tye Savoy ,NP 09/04/2021, 11:40 AM

## 2021-09-04 NOTE — Progress Notes (Signed)
PROGRESS NOTE    Caitlin Chavez  FBP:[id number] DOB: 09-Feb-1934 DOA: 08/31/2021 PCP: Sueanne Margarita, DO     Brief Narrative: Patient is an 87 year old female with history of chronic A-fib on Eliquis, hypertension, chronic UTIs presents the ER today with confusion and weakness.  Patient has recent history of UTI secondary to E. coli that was treated with multiple antibiotics, as well as, steroid treatment for neck pain.  Patient was admitted to await acute encephalopathy.  Hospital course has been complicated by sepsis, abdominal pain, diarrhea stools and electrolyte abnormalities.  09/04/2021: Patient seen alongside patient's son and Nurse.  No significant history from patient.  Patient remains sleepy/lethargic.  Patient continues to have diarrhea stools but now nonbloody.  No fever is reported.  Left-sided lower abdominal pain persists.  Remains on IV Zosyn.    Assessment & Plan:  Principal Problem:   Acute metabolic encephalopathy Active Problems:   Generalized weakness   Essential hypertension   Chronic a-fib (HCC)   Sigmoid diverticulitis   Chronic anticoagulation - on eliquis for afib   Cervical pain (neck)   Hypokalemia   GI bleed    Assessment and Plan:  Acute metabolic encephalopathy, presents on admission  -Blood culture no growth so far, chest x-ray no acute findings, UA no sign of  infection ( urine culture + 10,000 enterococcus, >100,000 lactobacillus, not sure if contamination or colonization, current abx should cover both though) -Confusion likely due to diverticulitis, electrolyte abnormalities -Resolved, AAOx3 09/04/2021: Likely combined metabolic and toxic encephalopathy.  Continue treatment for acute diverticulitis.  Continue volume repletion.  Apparently, patient may be intravascularly depleted.  Patient could not receive IV albumin due to concerns for allergy.  Continue to monitor on the electrolytes and replete accordingly.  Sepsis developed on 8/5 pm Abx  broadened to zosyn Wbc remain stable, continue to have ab pain, appear weak, kub no acute findings, will repeat CT ab/pel with oral and iv contrast, case discussed with GI  PM Addendum: Ct ab/pel showed noncomplicated diverticulitis, atelectasis and small left pleural effusion Family reports patient appear to have generalized edema today, she started to wheeze some, her blood pressure has been stable, lactic acid normalized, will try low dose lasix, ( initially plan to give albumin with lasix but there is one case report regarding albumin cause anaphylaxis reaction in patient with alpha gal, so did not give albumin) Echocardiogram ordered  09/04/2021: Sepsis physiology has resolved significantly.  Continue to manage expectantly.  Acute diverticulitis CT obtained in the ED showed uncomplicated sigmoid diverticulitis initially, repeat ct on 8/6 ordered  Received cefepime and vancomycin in the ED, then on Keflex and Flagyl, abx changed to zosyn due to developing sepsis on 8/5. 09/04/2021: Continue antibiotics.  He is currently on IV Zosyn.  Acute blood loss anemia -had BRBPR  -son reports patient has been having darker stool for hte past week at home ( recent steroids exposure) -she c/o LLQ pain, no epigastric pain currently --Started on Protonix twice a day  -Hold Eliquis -h/h down trending, GI consulted, will follow recommendation  Hyponatremia: -Likely prerenal. -Suspect ineffective circulatory arterial volume. -IV albumin 25 g twice daily x 48 hours -Continue to monitor sodium level -Check urine sodium, urine osmolality and serum osmolality.  Hypokalemia -Repleted. -Potassium is 3.6 today.  Hypomagnesemia -Repleted. -Magnesium is 2 today.  HTN, developed sepsis, hold  Coreg,  hold HCTZ, resume when appropriate  PAF, currently sinus rhythm few nonsustained V. Tach -coreg hold due to sepsis, low dose lopressor  with holding parameters -Secondary hypercoagulable state from afib ,hold  Eliquis in the setting of GI bleed -Keep K above 4, mag above 2 -Continue telemetry   Cervical pain (neck) -CT c spine:  No acute fracture, does has  Multilevel degenerative changes.- - prn norco ordered at family's request -resume gabapentin at low dose, titrate up if able to tolerate  -Reported lidocaine patch helped ,currently no neck pain in this position, reports neck pain is positional , no weakness in arms, no numbness or tingling in arms -was scheduled to see [facility] on day of admission but end up in the hospital, Contacted [facility] at family 's request, [facility] recommended outpatient follow up.   FTT: Family reports patient at [facility] able to  ambulate independently , lives byherself, family comes in daily , she does not drive , no recent falls , became weak in the last few days  Son reports patient has poor appetite and 15lbs weight loss in last 3-6 months Seen by PT eval who recommend SNF,  family agrees SNF placement    Body mass index is 24.63 kg/m.      I have Reviewed nursing notes, Vitals, pain scores, I/o's, Lab results and  imaging results since pt's last encounter, details please see discussion above  I ordered the following labs:  Unresulted Labs (From admission, onward)     Start     Ordered   09/02/21 0500  CBC with Differential/Platelet  Tomorrow morning,   R       Question:  Specimen collection method  Answer:  Lab=Lab collect   09/01/21 1733   09/02/21 0500  Comprehensive metabolic panel  Tomorrow morning,   R       Question:  Specimen collection method  Answer:  Lab=Lab collect   09/01/21 1733   09/02/21 0500  Magnesium  Tomorrow morning,   R       Question:  Specimen collection method  Answer:  Lab=Lab collect   09/01/21 1733                                     DVT prophylaxis: SCDs Start: 09/01/21 0029   Code Status:   Code Status: DNR  Family Communication: Son at bedside daily Disposition:    Dispo: The patient is  from: Home              Anticipated d/c is to:  SNF sometime next week, currently remain in stepdown.                 Antimicrobials:    Anti-infectives (From admission, onward)    Start     Dose/Rate Route Frequency Ordered Stop   09/03/21 1600  piperacillin-tazobactam (ZOSYN) IVPB 3.375 g        3.375 g 12.5 mL/hr over 240 Minutes Intravenous Every 8 hours 09/03/21 1003     09/03/21 1100  piperacillin-tazobactam (ZOSYN) IVPB 3.375 g        3.375 g 100 mL/hr over 30 Minutes Intravenous NOW 09/03/21 1000 09/03/21 1034   09/02/21 1830  piperacillin-tazobactam (ZOSYN) IVPB 3.375 g  Status:  Discontinued        3.375 g 12.5 mL/hr over 240 Minutes Intravenous Every 8 hours 09/02/21 1816 09/03/21 1002   09/01/21 0800  cephALEXin (KEFLEX) capsule 500 mg  Status:  Discontinued        500 mg Oral Every 12 hours  09/01/21 0028 09/02/21 1803   09/01/21 0800  metroNIDAZOLE (FLAGYL) IVPB 500 mg  Status:  Discontinued        500 mg 100 mL/hr over 60 Minutes Intravenous Every 12 hours 09/01/21 0028 09/02/21 1816   08/31/21 1745  vancomycin (VANCOREADY) IVPB 1500 mg/300 mL        1,500 mg 150 mL/hr over 120 Minutes Intravenous  Once 08/31/21 1726 08/31/21 2040   08/31/21 1730  ceFEPIme (MAXIPIME) 2 g in sodium chloride 0.9 % 100 mL IVPB        2 g 200 mL/hr over 30 Minutes Intravenous  Once 08/31/21 1726 08/31/21 1836          Objective: Vitals:   09/04/21 0500 09/04/21 0600 09/04/21 0700 09/04/21 0800  BP: 131/73 126/65 (!) 128/59 132/70  Pulse: (!) 106 (!) 107 (!) 109 (!) 114  Resp: '19 20 20 '$ (!) 21  Temp:    98.2 F (36.8 C)  TempSrc:    Oral  SpO2: 96% 96% 95% 95%  Weight:      Height:        Intake/Output Summary (Last 24 hours) at 09/04/2021 0951 Last data filed at 09/04/2021 0400 Gross per 24 hour  Intake 528.4 ml  Output 400 ml  Net 128.4 ml    Filed Weights   08/31/21 1458  Weight: 67.1 kg    Examination:  General exam: Sleepy/lethargic.  Not in any distress.   Peripheral edema Respiratory system: Clear to auscultation. Cardiovascular system: S1-S2.   Gastrointestinal system: Obese, soft with tenderness towards the left lower abdominal quadrant. Central nervous system: Sleepy/lethargic  Extremities: Edema of the upper and lower extremity.  Data Reviewed: I have personally reviewed  labs and visualized  imaging studies since the last encounter and formulate the plan        Scheduled Meds:  Chlorhexidine Gluconate Cloth  6 each Topical Daily   feeding supplement (KATE FARMS STANDARD 1.4)  325 mL Oral BID BM   gabapentin  100 mg Oral BID   lidocaine  1 patch Transdermal Q24H   melatonin  3 mg Oral QHS   metoprolol tartrate  12.5 mg Oral BID   pantoprazole (PROTONIX) IV  40 mg Intravenous Q12H   Continuous Infusions:  piperacillin-tazobactam (ZOSYN)  IV 3.375 g (09/04/21 0809)     LOS: 3 days     Bonnell Public, MD  [facility]  Available via Epic secure chat 7am-7pm for nonurgent issues Please page for urgent issues To page the attending provider between 7A-7P or the covering provider during after hours 7P-7A, please log into the web site [email or web address] and access using universal [place] password for that web site. If you do not have the password, please call the hospital operator.    09/04/2021, 9:51 AM

## 2021-09-04 NOTE — TOC Progression Note (Signed)
Transition of Care [facility]) - Progression Note    Patient Details  Name: Caitlin Chavez MRN: [id number] Date of Birth: 1934-08-10  [facility] CM/SW Contact  Purcell Mouton, RN Phone Number: 09/04/2021, 3:02 PM  Clinical Narrative:    TOC will continue to follow pt for discharge.    Expected Discharge Plan:  (TBD) Barriers to Discharge: Continued Medical Work up  Expected Discharge Plan and Services Expected Discharge Plan:  (TBD)   Discharge Planning Services: CM Consult Post Acute Care Choice: [place] Living arrangements for the past 2 months: Single Family Home                                       Social Determinants of Health (SDOH) Interventions    Readmission Risk Interventions     No data to display

## 2021-09-04 NOTE — Progress Notes (Signed)
  Echocardiogram 2D Echocardiogram has been performed.  Bobbye Charleston 09/04/2021, 12:19 PM

## 2021-09-05 DIAGNOSIS — K5732 Diverticulitis of large intestine without perforation or abscess without bleeding: Secondary | ICD-10-CM | POA: Diagnosis not present

## 2021-09-05 DIAGNOSIS — K625 Hemorrhage of anus and rectum: Secondary | ICD-10-CM | POA: Diagnosis not present

## 2021-09-05 DIAGNOSIS — R197 Diarrhea, unspecified: Secondary | ICD-10-CM | POA: Diagnosis not present

## 2021-09-05 DIAGNOSIS — G9341 Metabolic encephalopathy: Secondary | ICD-10-CM | POA: Diagnosis not present

## 2021-09-05 LAB — RENAL FUNCTION PANEL
Albumin: 2 g/dL — ABNORMAL LOW (ref 3.5–5.0)
Anion gap: 10 (ref 5–15)
BUN: 14 mg/dL (ref 8–23)
CO2: 23 mmol/L (ref 22–32)
Calcium: 7.8 mg/dL — ABNORMAL LOW (ref 8.9–10.3)
Chloride: 95 mmol/L — ABNORMAL LOW (ref 98–111)
Creatinine, Ser: 0.73 mg/dL (ref 0.44–1.00)
GFR, Estimated: >60 mL/min (ref >60)
Glucose, Bld: 99 mg/dL (ref 70–99)
Phosphorus: 2.1 mg/dL — ABNORMAL LOW (ref 2.5–4.6)
Potassium: 3.1 mmol/L — ABNORMAL LOW (ref 3.5–5.1)
Sodium: 128 mmol/L — ABNORMAL LOW (ref 135–145)

## 2021-09-05 LAB — C DIFFICILE (CDIFF) QUICK SCRN (NO PCR REFLEX)
C Diff antigen: NEGATIVE
C Diff interpretation: NOT DETECTED
C Diff toxin: NEGATIVE

## 2021-09-05 LAB — CULTURE, BLOOD (ROUTINE X 2)
Culture: NO GROWTH
Culture: NO GROWTH
Special Requests: ADEQUATE
Special Requests: ADEQUATE

## 2021-09-05 LAB — MAGNESIUM: Magnesium: 1.7 mg/dL (ref 1.7–2.4)

## 2021-09-05 MED ORDER — POTASSIUM CHLORIDE 10 MEQ/100ML IV SOLN
10.0000 meq | INTRAVENOUS | Status: AC
  Administered 2021-09-05 (×4): 10 meq via INTRAVENOUS
  Filled 2021-09-05 (×4): qty 100

## 2021-09-05 MED ORDER — MAGNESIUM SULFATE 2 GM/50ML IV SOLN
2.0000 g | Freq: Once | INTRAVENOUS | Status: AC
Start: 2021-09-05 — End: 2021-09-05
  Administered 2021-09-05: 2 g via INTRAVENOUS
  Filled 2021-09-05: qty 50

## 2021-09-05 MED ORDER — K PHOS MONO-SOD PHOS DI & MONO 155-852-130 MG PO TABS
250.0000 mg | ORAL_TABLET | Freq: Two times a day (BID) | ORAL | Status: AC
Start: 2021-09-05 — End: 2021-09-07
  Administered 2021-09-05 – 2021-09-07 (×4): 250 mg via ORAL
  Filled 2021-09-05 (×5): qty 1

## 2021-09-05 MED ORDER — POTASSIUM CHLORIDE CRYS ER 20 MEQ PO TBCR
40.0000 meq | EXTENDED_RELEASE_TABLET | Freq: Once | ORAL | Status: AC
  Administered 2021-09-05: 40 meq via ORAL
  Filled 2021-09-05: qty 2

## 2021-09-05 MED ORDER — POTASSIUM CHLORIDE IN NACL 20-0.9 MEQ/L-% IV SOLN
INTRAVENOUS | Status: DC
  Filled 2021-09-05 (×6): qty 1000

## 2021-09-05 NOTE — Progress Notes (Signed)
PROGRESS NOTE    Caitlin Chavez  HKV:[id number] DOB: 1934/12/04 DOA: 08/31/2021 PCP: Sueanne Margarita, DO     Brief Narrative: Patient is an 87 year old female with history of chronic A-fib on Eliquis, hypertension, chronic UTIs presents the ER today with confusion and weakness.  Patient has recent history of UTI secondary to E. coli that was treated with multiple antibiotics, as well as, steroid treatment for neck pain.  Patient was admitted to await acute encephalopathy.  Hospital course has been complicated by sepsis, abdominal pain, diarrhea stools and electrolyte abnormalities.  09/04/2021: Patient seen alongside patient's son and Nurse.  No significant history from patient.  Patient remains sleepy/lethargic.  Patient continues to have diarrhea stools but now nonbloody.  No fever is reported.  Left-sided lower abdominal pain persists.  Remains on IV Zosyn.  09/05/2021: Patient seen alongside patient's son and nurse.  Patient looks better today.  Patient continues to have diarrhea.  GI panel is pending.  So far, no C. difficile.  She has history of giardiasis.  There may be antibiotics related.  Electrolyte abnormalities noted, sodium of 128, potassium of 3.1, phosphorus of 2.1 and magnesium of 1.7.      Assessment & Plan:  Principal Problem:   Acute metabolic encephalopathy Active Problems:   Generalized weakness   Essential hypertension   Chronic a-fib (HCC)   Sigmoid diverticulitis   Chronic anticoagulation - on eliquis for afib   Cervical pain (neck)   Hypokalemia   GI bleed    Assessment and Plan:  Acute metabolic encephalopathy, presents on admission  -Blood culture no growth so far, chest x-ray no acute findings, UA no sign of  infection ( urine culture + 10,000 enterococcus, >100,000 lactobacillus, not sure if contamination or colonization, current abx should cover both though) -Confusion likely due to diverticulitis, electrolyte abnormalities -Resolved, AAOx3 09/04/2021:  Likely combined metabolic and toxic encephalopathy.  Continue treatment for acute diverticulitis.  Continue volume repletion.  Apparently, patient may be intravascularly depleted.  Patient could not receive IV albumin due to concerns for allergy.  Continue to monitor on the electrolytes and replete accordingly. 09/05/2021: Encephalopathy is slowly resolving.  Patient is more communicative today.  Patient is also more awake and alert today.  Sepsis developed on 8/5 pm Abx broadened to zosyn Wbc remain stable, continue to have ab pain, appear weak, kub no acute findings, will repeat CT ab/pel with oral and iv contrast, case discussed with GI  PM Addendum: Ct ab/pel showed noncomplicated diverticulitis, atelectasis and small left pleural effusion Family reports patient appear to have generalized edema today, she started to wheeze some, her blood pressure has been stable, lactic acid normalized, will try low dose lasix, ( initially plan to give albumin with lasix but there is one case report regarding albumin cause anaphylaxis reaction in patient with alpha gal, so did not give albumin) Echocardiogram ordered  09/04/2021: Sepsis physiology has resolved significantly.  Continue to manage expectantly. 09/05/2021: Patient continues to have left-sided abdominal pain.  Repeat CT scan of the abdomen and pelvis done on 09/03/2020 revealed mild uncomplicated sigmoid diverticulitis.  Acute diverticulitis CT obtained in the ED showed uncomplicated sigmoid diverticulitis initially, repeat ct on 8/6 ordered  Received cefepime and vancomycin in the ED, then on Keflex and Flagyl, abx changed to zosyn due to developing sepsis on 8/5. 09/04/2021: Continue antibiotics.  He is currently on IV Zosyn. 09/05/2021: See above documentation.  Acute blood loss anemia -had BRBPR  -son reports patient has been having darker  stool for hte past week at home ( recent steroids exposure) -she c/o LLQ pain, no epigastric pain  currently --Started on Protonix twice a day  -Hold Eliquis -h/h down trending, GI consulted, will follow recommendation  Hyponatremia: -Likely prerenal. -Suspect ineffective circulatory arterial volume. -IV albumin 25 g twice daily x 48 hours -Continue to monitor sodium level -Check urine sodium, urine osmolality and serum osmolality. 09/05/2021: IV albumin was not given due to allergies.  Gentle hydration.  Aggressively nutrition, however, patient has poor appetite.    Hypokalemia -Potassium is 3.1 today. -Replete.  Hypomagnesemia -Magnesium is 1.7 today. -Replete.  Hypophosphatemia: -Phosphorus today was 2.1. -Replete.   HTN, developed sepsis, hold  Coreg,  hold HCTZ, resume when appropriate  PAF, currently sinus rhythm few nonsustained V. Tach -coreg hold due to sepsis, low dose lopressor with holding parameters -Secondary hypercoagulable state from afib ,hold Eliquis in the setting of GI bleed -Keep K above 4, mag above 2 -Continue telemetry   Cervical pain (neck) -CT c spine:  No acute fracture, does has  Multilevel degenerative changes.- - prn norco ordered at family's request -resume gabapentin at low dose, titrate up if able to tolerate  -Reported lidocaine patch helped ,currently no neck pain in this position, reports neck pain is positional , no weakness in arms, no numbness or tingling in arms -was scheduled to see [facility] on day of admission but end up in the hospital, Contacted [facility] at family 's request, [facility] recommended outpatient follow up.   FTT: Family reports patient at [facility] able to  ambulate independently , lives byherself, family comes in daily , she does not drive , no recent falls , became weak in the last few days  Son reports patient has poor appetite and 15lbs weight loss in last 3-6 months Seen by PT eval who recommend SNF,  family agrees SNF placement    Body mass index is 24.63 kg/m.      I have Reviewed nursing  notes, Vitals, pain scores, I/o's, Lab results and  imaging results since pt's last encounter, details please see discussion above  I ordered the following labs:  Unresulted Labs (From admission, onward)     Start     Ordered   09/02/21 0500  CBC with Differential/Platelet  Tomorrow morning,   R       Question:  Specimen collection method  Answer:  Lab=Lab collect   09/01/21 1733   09/02/21 0500  Comprehensive metabolic panel  Tomorrow morning,   R       Question:  Specimen collection method  Answer:  Lab=Lab collect   09/01/21 1733   09/02/21 0500  Magnesium  Tomorrow morning,   R       Question:  Specimen collection method  Answer:  Lab=Lab collect   09/01/21 1733                                     DVT prophylaxis: SCDs Start: 09/01/21 0029   Code Status:   Code Status: DNR  Family Communication: Son at bedside daily Disposition:    Dispo: The patient is from: Home              Anticipated d/c is to:  SNF sometime next week, currently remain in stepdown.                 Antimicrobials:    Anti-infectives (  From admission, onward)    Start     Dose/Rate Route Frequency Ordered Stop   09/03/21 1600  piperacillin-tazobactam (ZOSYN) IVPB 3.375 g        3.375 g 12.5 mL/hr over 240 Minutes Intravenous Every 8 hours 09/03/21 1003     09/03/21 1100  piperacillin-tazobactam (ZOSYN) IVPB 3.375 g        3.375 g 100 mL/hr over 30 Minutes Intravenous NOW 09/03/21 1000 09/03/21 1034   09/02/21 1830  piperacillin-tazobactam (ZOSYN) IVPB 3.375 g  Status:  Discontinued        3.375 g 12.5 mL/hr over 240 Minutes Intravenous Every 8 hours 09/02/21 1816 09/03/21 1002   09/01/21 0800  cephALEXin (KEFLEX) capsule 500 mg  Status:  Discontinued        500 mg Oral Every 12 hours 09/01/21 0028 09/02/21 1803   09/01/21 0800  metroNIDAZOLE (FLAGYL) IVPB 500 mg  Status:  Discontinued        500 mg 100 mL/hr over 60 Minutes Intravenous Every 12 hours 09/01/21 0028 09/02/21 1816    08/31/21 1745  vancomycin (VANCOREADY) IVPB 1500 mg/300 mL        1,500 mg 150 mL/hr over 120 Minutes Intravenous  Once 08/31/21 1726 08/31/21 2040   08/31/21 1730  ceFEPIme (MAXIPIME) 2 g in sodium chloride 0.9 % 100 mL IVPB        2 g 200 mL/hr over 30 Minutes Intravenous  Once 08/31/21 1726 08/31/21 1836          Objective: Vitals:   09/05/21 0900 09/05/21 1000 09/05/21 1100 09/05/21 1200  BP: 104/62 (!) 108/58 130/74 126/73  Pulse: 98 98 95 89  Resp: '15 15 17 16  '$ Temp:    (!) 97.5 F (36.4 C)  TempSrc:    Oral  SpO2: 96% 97% 97% 94%  Weight:      Height:        Intake/Output Summary (Last 24 hours) at 09/05/2021 1408 Last data filed at 09/05/2021 1258 Gross per 24 hour  Intake 1442.81 ml  Output 1700 ml  Net -257.19 ml    Filed Weights   08/31/21 1458  Weight: 67.1 kg    Examination:  General exam: Sleepy/lethargic.  Not in any distress.  Peripheral edema Respiratory system: Clear to auscultation. Cardiovascular system: S1-S2.   Gastrointestinal system: Obese, soft with tenderness towards the left lower abdominal quadrant. Central nervous system: Sleepy/lethargic  Extremities: Edema of the upper and lower extremity.  Data Reviewed: I have personally reviewed  labs and visualized  imaging studies since the last encounter and formulate the plan        Scheduled Meds:  Chlorhexidine Gluconate Cloth  6 each Topical Daily   feeding supplement (KATE FARMS STANDARD 1.4)  325 mL Oral BID BM   gabapentin  100 mg Oral BID   lidocaine  1 patch Transdermal Q24H   melatonin  3 mg Oral QHS   metoprolol tartrate  12.5 mg Oral BID   pantoprazole (PROTONIX) IV  40 mg Intravenous Q12H   Continuous Infusions:  piperacillin-tazobactam (ZOSYN)  IV Stopped (09/05/21 1213)     LOS: 4 days     Bonnell Public, MD  [facility]  Available via Epic secure chat 7am-7pm for nonurgent issues Please page for urgent issues To page the attending provider between  7A-7P or the covering provider during after hours 7P-7A, please log into the web site [email or web address] and access using universal [place] password for that web site. If  you do not have the password, please call the hospital operator.    09/05/2021, 2:08 PM

## 2021-09-05 NOTE — Progress Notes (Signed)
Physical Therapy Treatment Patient Details Name: Caitlin Chavez MRN: [id number] DOB: 04/24/1934 Today's Date: 09/05/2021   History of Present Illness 87 year old female history of chronic A-fib on Eliquis, hypertension, chronic UTIs presents the ER today with confusion and weakness. She has been getting injections for neck pain recently. Sons report significant decline in her function in the last month. patient was transitioned to stepdown unit on 8/6 with sepsis protocol initated.    PT Comments    General Comments: improved AxO x 3 slightly slow/groggy but aware and following all commands.  Assisted OOB required + 2 assist and increased time.  General bed mobility comments: required + 2 asisst, increased time and use of pad to complete scooting to EOB.  Sat EOB x 4 min to "adjust" at [employer]. General transfer comment: + 2 side by side assist from elevated bed and 75% VC's on proper hand placement.  Also required asisst to control asisst to sit. General Gait Details: very limited amb distance of 2 feet with + 2 side by side assist and Daughter in [place] following with recliner.  Distance limited by fatigue/effort/dizziness.  Profoundly weak. Positioned to comfort in recliner.  Pt c/o rectal pain (from fexi seal).  Pt will need ST Rehab at SNF to address mobility and functional decline prior to safely returning home.   Recommendations for follow up therapy are one component of a multi-disciplinary discharge planning process, led by the attending physician.  Recommendations may be updated based on patient status, additional functional criteria and insurance authorization.  Follow Up Recommendations  Skilled nursing-short term rehab (<3 hours/day) Can patient physically be transported by private vehicle: Yes   Assistance Recommended at Discharge Frequent or constant Supervision/Assistance  Patient can return home with the following A little help with walking and/or transfers   Equipment  Recommendations  None recommended by PT    Recommendations for Other Services       Precautions / Restrictions Precautions Precautions: Fall Precaution Comments: rectal tube Restrictions Weight Bearing Restrictions: No     Mobility  Bed Mobility Overal bed mobility: Needs Assistance Bed Mobility: Supine to Sit     Supine to sit: Max assist, Total assist, +2 for physical assistance, +2 for safety/equipment     General bed mobility comments: required + 2 asisst, increased time and use of pad to complete scooting to EOB.  Sat EOB x 4 min to "adjust" at [employer].    Transfers Overall transfer level: Needs assistance Equipment used: Rolling walker (2 wheels) Transfers: Sit to/from Stand Sit to Stand: Mod assist, Max assist, +2 physical assistance, +2 safety/equipment           General transfer comment: + 2 side by side assist from elevated bed and 75% VC's on proper hand placement.  Also required asisst to control asisst to sit.    Ambulation/Gait Ambulation/Gait assistance: Total assist, +2 physical assistance, +2 safety/equipment, Max assist Gait Distance (Feet): 2 Feet Assistive device: Rolling walker (2 wheels) Gait Pattern/deviations: Step-to pattern Gait velocity: decreased     General Gait Details: very limited amb distance of 2 feet with + 2 side by side assist and Daughter in [place] following with recliner.  Distance limited by fatigue/effort/dizziness.  Profoundly weak.   Stairs             Wheelchair Mobility    Modified Rankin (Stroke Patients Only)       Balance  Cognition Arousal/Alertness: Awake/alert Behavior During Therapy: WFL for tasks assessed/performed Overall Cognitive Status: Within Functional Limits for tasks assessed                                 General Comments: improved AxO x 3 slightly slow/groggy but aware and following all commands         Exercises      General Comments        Pertinent Vitals/Pain Pain Assessment Pain Assessment: Faces Faces Pain Scale: Hurts a little bit Pain Location: neck Pain Descriptors / Indicators: Grimacing Pain Intervention(s): Monitored during session, Repositioned    Home Living                          Prior Function            PT Goals (current goals can now be found in the care plan section) Progress towards PT goals: Progressing toward goals    Frequency           PT Plan Current plan remains appropriate    Co-evaluation              AM-PAC PT "6 Clicks" Mobility   Outcome Measure  Help needed turning from your back to your side while in a flat bed without using bedrails?: A Lot Help needed moving from lying on your back to sitting on the side of a flat bed without using bedrails?: A Lot Help needed moving to and from a bed to a chair (including a wheelchair)?: A Lot Help needed standing up from a chair using your arms (e.g., wheelchair or bedside chair)?: A Lot Help needed to walk in hospital room?: Total Help needed climbing 3-5 steps with a railing? : Total 6 Click Score: 10    End of Session Equipment Utilized During Treatment: Gait belt Activity Tolerance: Patient limited by fatigue Patient left: in chair;with call bell/phone within reach;with family/visitor present Nurse Communication: Mobility status PT Visit Diagnosis: Muscle weakness (generalized) (M62.81);Pain;Difficulty in walking, not elsewhere classified (R26.2)     Time: 2725-3664 PT Time Calculation (min) (ACUTE ONLY): 29 min  Charges:  $Gait Training: 8-22 mins $Therapeutic Activity: 8-22 mins                     {Josey Forcier  PTA Acute  Rehabilitation Services Office M-F          [phone] Weekend pager [phone]

## 2021-09-05 NOTE — Progress Notes (Signed)
Occupational Therapy Treatment Patient Details Name: Caitlin Chavez MRN: [id number] DOB: December 14, 1934 Today's Date: 09/05/2021   History of present illness 87 year old female history of chronic A-fib on Eliquis, hypertension, chronic UTIs presents the ER today with confusion and weakness. She has been getting injections for neck pain recently. Sons report significant decline in her function in the last month. patient was transitioned to stepdown unit on 8/6 with sepsis protocol initated.   OT comments  Patient was noted to be quick to fatigue with mod A to maintain stilting balance in recliner for nursing to assist with hygiene tasks. Patient was mod A with  lean to R side in standing with RW. Patient needed mod A +2 to turn back to bed with increased time and education on proper hand and foot placement. Patient noted to have incontinence episode with movement. Patient would continue to benefit from skilled OT services at this time while admitted and after d/c to address noted deficits in order to improve overall safety and independence in ADLs.     Recommendations for follow up therapy are one component of a multi-disciplinary discharge planning process, led by the attending physician.  Recommendations may be updated based on patient status, additional functional criteria and insurance authorization.    Follow Up Recommendations  Skilled nursing-short term rehab (<3 hours/day)    Assistance Recommended at Discharge Frequent or constant Supervision/Assistance  Patient can return home with the following  Two people to help with walking and/or transfers;Two people to help with bathing/dressing/bathroom;Direct supervision/assist for medications management;Help with stairs or ramp for entrance;Assist for transportation;Direct supervision/assist for financial management;Assistance with cooking/housework;Assistance with feeding   Equipment Recommendations  Other (comment)    Recommendations for Other  Services      Precautions / Restrictions Precautions Precautions: Fall Precaution Comments: rectal tube Restrictions Weight Bearing Restrictions: No       Mobility Bed Mobility   Bed Mobility: Sit to Supine       Sit to supine: Mod assist, Max assist, +2 for safety/equipment   General bed mobility comments: with increased time to transition from sit to supine with education on proper hand and foot placement.    Transfers                         Balance Overall balance assessment: Needs assistance         Standing balance support: Bilateral upper extremity supported, During functional activity Standing balance-Leahy Scale: Poor Standing balance comment: and physical assist to maintain standing balance                           ADL either performed or assessed with clinical judgement   ADL Overall ADL's : Needs assistance/impaired         Upper Body Bathing: Maximal assistance Upper Body Bathing Details (indicate cue type and reason): patient attempted to help with quick to fatigue with mod A to keep balance sitting upright without support on recliner for nusing assitance with washing. patient noted to lean to R side with effort. Lower Body Bathing: Total assistance;Sit to/from stand Lower Body Bathing Details (indicate cue type and reason): patient was able to maintain standing with mod A and RW for nursing to complete hygiene for patient. noted to have incontinence with standing. patient repoted urinating when nervous     Lower Body Dressing: Total assistance;Bed level   Toilet Transfer: +2 for physical assistance;+2 for safety/equipment Toilet  Transfer Details (indicate cue type and reason): with RW to transfer from recliner in room to edge of bed with increased time and cues for proper positioning.                Extremity/Trunk Assessment Upper Extremity Assessment Upper Extremity Assessment: Generalized weakness (noted to have  crepitus in R shoulder with movement to RW)            Vision       Perception     Praxis      Cognition Arousal/Alertness: Awake/alert Behavior During Therapy: WFL for tasks assessed/performed Overall Cognitive Status: Within Functional Limits for tasks assessed                                 General Comments: patient was able to follow all commands. significantly less lethargic than previous day        Exercises      Shoulder Instructions       General Comments      Pertinent Vitals/ Pain       Pain Assessment Pain Assessment: Faces Faces Pain Scale: Hurts even more Pain Location: neck, back and perineal area Pain Descriptors / Indicators: Discomfort, Grimacing Pain Intervention(s): Limited activity within patient's tolerance, Monitored during session, Patient requesting pain meds-RN notified  Home Living                                          Prior Functioning/Environment              Frequency  Min 2X/week        Progress Toward Goals  OT Goals(current goals can now be found in the care plan section)  Progress towards OT goals: Progressing toward goals     Plan Discharge plan remains appropriate    Co-evaluation                 AM-PAC OT "6 Clicks" Daily Activity     Outcome Measure   Help from another person eating meals?: A Lot Help from another person taking care of personal grooming?: A Lot Help from another person toileting, which includes using toliet, bedpan, or urinal?: Total Help from another person bathing (including washing, rinsing, drying)?: Total Help from another person to put on and taking off regular upper body clothing?: Total Help from another person to put on and taking off regular lower body clothing?: Total 6 Click Score: 8    End of Session Equipment Utilized During Treatment: Gait belt;Rolling walker (2 wheels)  OT Visit Diagnosis: Unsteadiness on feet (R26.81);Other  abnormalities of gait and mobility (R26.89);Muscle weakness (generalized) (M62.81);Feeding difficulties (R63.3)   Activity Tolerance Patient limited by fatigue   Patient Left in bed;with call bell/phone within reach;with nursing/sitter in room   Nurse Communication Other (comment) (nurse present in room)        Time: [phone] OT Time Calculation (min): 29 min  Charges: OT General Charges $OT Visit: 1 Visit OT Treatments $Self Care/Home Management : 23-37 mins  Jackelyn Poling OTR/L, MS Acute Rehabilitation Department Office# [phone] Pager# [phone]   Marcellina Millin 09/05/2021, 4:18 PM

## 2021-09-05 NOTE — Progress Notes (Signed)
Daily Progress Note  Hospital Day: 6  Chief Complaint: abdominal pain, diarrhea, rectal bleeding  Brief History Caitlin Chavez is a 87 y.o. female with a pmh not limited to Afib on Eliquis, HTN, large hiatal hernia on CT scan, chronic UTIs, chronic neck pain. Admitted with diverticulitis and altered mental status.    Assessment / Plan   # 87 yo female with sigmoid diverticulitis, uncomplicated.  Repeat CT scan 09/03/21 done for worsening abdominal pain showed mild uncomplicated sigmoid diverticulitis, unchanged from prior CT scan. No abscess. No extraluminal or free air. She denies abdominal pain but tells me she is very tender when examined. She is indeed very tender on left side and in her periumbilical region ( where there appears to be a small periumbilical hernia). However she is afebrile with a normal WBC on antibiotics so seems unlikely that diverticulitis would have progressed but will watch closely On day # 3 of Zosyn She ate some breakfast today which is a food sign   # Diarrhea, non-bloody. Son very concerned about the diarrhea ( it sounds like she has been having frequent BMs at home, mainly after meals but it wasn't diarrhea). She does have a history of giardia. Antibiotics could also be contributing.  C-diff is negative. GI path panel is pending.  Diarrhea sounds more osmotic in nature.  If negative GI path panel negative will consider imodium A flexiseal is still in place, no significant output   # Rectal bleeding ( started after admission).  Also with reports of dark stools at home over preceding weeks. Bleeding possibly hemorrhoidal.  Bleeding has resolved.  Colonoscopy was not recommended in setting of diverticulitis.  Continue BID PPI in setting of darker stools / prednisone + NSAID use Anusol supp were given a couple of time then appears withheld or she refused them and then they were discontinued. Since bleeding stopped will not resume suppositories for now  #  Pancreatic lesion. 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 Hgb stable at 9.5. Hard to really know what her baseline is. It was 11.5 on admission      Subjective   No abdominal pain but tells me how much is hurts when she is examined. She ate breakfast today.   Objective    CT ABDOMEN PELVIS W CONTRAST  Result Date: 09/03/2021 CLINICAL DATA:  Abdominal pain.  Lethargy and weakness. EXAM: CT ABDOMEN AND PELVIS WITH CONTRAST TECHNIQUE: Multidetector CT imaging of the abdomen and pelvis was performed using the standard protocol following bolus administration of intravenous contrast. RADIATION DOSE REDUCTION: This exam was performed according to the departmental dose-optimization program which includes automated exposure control, adjustment of the mA and/or kV according to patient size and/or use of iterative reconstruction technique. CONTRAST:  142m OMNIPAQUE IOHEXOL 300 MG/ML  SOLN COMPARISON:  08/31/2021. FINDINGS: Lower chest: Dependent lung base opacities, most evident in the left lower lobe adjacent to the moderate to large hiatal hernia. Trace pericardial effusion. Trace left pleural effusion. Hepatobiliary: No focal liver abnormality is seen. Status post cholecystectomy. No biliary dilatation. 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 This was not  clearly present on a CT from 10/28/2018, and not present on the CT from 11/21/2015. Is no other pancreatic abnormality. No inflammation. Spleen: Normal in size without focal abnormality. Adrenals/Urinary Tract: No adrenal mass. Kidneys normal in size, orientation and position. 7 mm hypoattenuating cortical  mass along the midpole the right kidney consistent with a cyst, stable with no follow-up recommended. No other renal masses or lesions, no stones and no hydronephrosis. Normal ureters. Normal bladder. Stomach/Bowel: Mild wall thickening with subtle adjacent inflammation of the proximal sigmoid colon, where there are multiple diverticula, without significant change from the recent prior exam. No extraluminal or free air. No fluid collection to suggest an abscess. Remainder of the colon with no additional areas of wall thickening or inflammation. Large hiatal hernia. Stomach otherwise unremarkable. Normal small bowel. Normal appendix. Vascular/Lymphatic: Aortic atherosclerosis. No aneurysm. No enlarged lymph nodes. Reproductive: Status post hysterectomy. No adnexal masses. Other: Small fat containing periumbilical hernia, stable. No ascites. Musculoskeletal: No fracture or acute finding.  No bone lesion. IMPRESSION: 1. Mild uncomplicated sigmoid diverticulitis, unchanged from the CT performed on 08/31/2021. No abscess. No extraluminal or free air. 2. Lung base opacities, left greater than right, consistent with atelectasis. This is similar to the prior exam. Trace left pleural effusion is new from the prior study. 3. No other acute abnormality within the abdomen or pelvis. Electronically Signed   By: Lajean Manes M.D.   On: 09/03/2021 14:36   DG Abd 1 View  Result Date: 09/03/2021 CLINICAL DATA:  Abdominal pain. EXAM: ABDOMEN - 1 VIEW COMPARISON:  CT, 08/31/2021. FINDINGS: Normal bowel gas pattern. Status post cholecystectomy. No evidence of renal or ureteral stones. No acute skeletal abnormality. Left lung base opacity consistent with atelectasis. Moderate hiatal hernia. IMPRESSION: 1. No acute findings.  No evidence of bowel obstruction. Electronically Signed   By: Lajean Manes M.D.   On: 09/03/2021 09:43   DG CHEST PORT 1 VIEW  Result Date: 09/02/2021 CLINICAL DATA:  Wheezing. EXAM: PORTABLE CHEST 1 VIEW  COMPARISON:  August 31, 2021 FINDINGS: Enlarged cardiac silhouette. Large hiatal hernia causes compression of the left lower lobe. Left lower lobe atelectasis versus airspace consolidation. The right lung is clear. Osseous structures are without acute abnormality. Soft tissues are grossly normal. IMPRESSION: 1. Enlarged cardiac silhouette. Please note that the patient has a pericardial effusion measuring up to 7 mm, demonstrated on the prior abdominal CT dated August 31, 2021. Further evaluation with cardiac echo may be considered. 2. Large hiatal hernia causes compression of the left lower lobe. 3. Left lower lobe atelectasis versus airspace consolidation. Electronically Signed   By: Fidela Salisbury M.D.   On: 09/02/2021 18:30   DG Knee 1-2 Views Left  Result Date: 09/01/2021 CLINICAL DATA:  Left knee pain. EXAM: LEFT KNEE - 1-2 VIEW COMPARISON:  None Available. FINDINGS: There is diffuse decreased bone mineralization. Mild medial compartment of the knee joint space narrowing. Tiny superior patellar degenerative osteophyte. Tiny joint effusion. No acute fracture is seen. No dislocation. IMPRESSION: Minimal medial and patellofemoral compartment osteoarthritis. No acute fracture is seen. Electronically Signed   By: Yvonne Kendall M.D.   On: 09/01/2021 15:08    Lab Results: Recent Labs    09/02/21 1841 09/03/21 0247 09/04/21 0234  WBC 8.1 8.2 9.7  HGB 9.8* 8.8* 9.5*  HCT 29.1* 26.7* 28.9*  PLT 220 184 193   BMET Recent Labs    09/03/21 0247 09/04/21 0234 09/05/21 0223  NA 130* 127* 128*  K 3.3* 3.6  3.1*  CL 101 96* 95*  CO2 21* 22 23  GLUCOSE 104* 113* 99  BUN '15 14 14  '$ CREATININE 0.75 0.96 0.73  CALCIUM 7.6* 7.9* 7.8*   LFT Recent Labs    09/05/21 0223  ALBUMIN 2.0*   PT/INR No results for input(s): "LABPROT", "INR" in the last 72 hours.   Scheduled inpatient medications:   Chlorhexidine Gluconate Cloth  6 each Topical Daily   feeding supplement (KATE FARMS STANDARD 1.4)   325 mL Oral BID BM   gabapentin  100 mg Oral BID   lidocaine  1 patch Transdermal Q24H   melatonin  3 mg Oral QHS   metoprolol tartrate  12.5 mg Oral BID   pantoprazole (PROTONIX) IV  40 mg Intravenous Q12H   Continuous inpatient infusions:   piperacillin-tazobactam (ZOSYN)  IV 3.375 g (09/05/21 0810)   potassium chloride 10 mEq (09/05/21 0956)   PRN inpatient medications: acetaminophen **OR** acetaminophen, albuterol, HYDROcodone-acetaminophen, lidocaine, ondansetron **OR** ondansetron (ZOFRAN) IV, mouth rinse  Vital signs in last 24 hours: Temp:  [97.5 F (36.4 C)-98.7 F (37.1 C)] 98.5 F (36.9 C) (08/08 0800) Pulse Rate:  [80-108] 98 (08/08 1000) Resp:  [12-22] 15 (08/08 1000) BP: (95-130)/(43-86) 108/58 (08/08 1000) SpO2:  [95 %-99 %] 97 % (08/08 1000) Last BM Date : 09/04/21  Intake/Output Summary (Last 24 hours) at 09/05/2021 1014 Last data filed at 09/05/2021 0800 Gross per 24 hour  Intake 1065.34 ml  Output 1000 ml  Net 65.34 ml     Physical Exam:  General: Alert obese female in NAD Heart:  Regular rate and rhythm. No lower extremity edema Pulmonary: Normal respiratory effort Abdomen: Soft, nondistended, significant periumbilical and LLQ tenderness on exam. Small periumbilical hernia.  Hyperactive bowel sounds.  Neurologic: Alert and oriented Psych: Pleasant. Cooperative.    Intake/Output from previous day: 08/07 0701 - 08/08 0700 In: 1065.3 [P.O.:810; IV Piggyback:255.3] Out: 1150 [Urine:1150] Intake/Output this shift: Total I/O In: -  Out: 150 [Urine:150]    Principal Problem:   Acute metabolic encephalopathy Active Problems:   Generalized weakness   Essential hypertension   Chronic a-fib (HCC)   Sigmoid diverticulitis   Chronic anticoagulation - on eliquis for afib   Cervical pain (neck)   Hypokalemia   GI bleed     LOS: 4 days   Tye Savoy ,NP 09/05/2021, 10:14 AM

## 2021-09-05 NOTE — Progress Notes (Signed)
Spoke to son Barbaraann Rondo - he just left because mom was sleepy and calm.  Feels that moving her tonight would disrupt her.  He did agree patient could be moved in AM before shift change if necessary.

## 2021-09-06 ENCOUNTER — Inpatient Hospital Stay (HOSPITAL_COMMUNITY): Payer: Medicare Other

## 2021-09-06 DIAGNOSIS — G9341 Metabolic encephalopathy: Secondary | ICD-10-CM | POA: Diagnosis not present

## 2021-09-06 DIAGNOSIS — K5732 Diverticulitis of large intestine without perforation or abscess without bleeding: Secondary | ICD-10-CM | POA: Diagnosis not present

## 2021-09-06 LAB — GASTROINTESTINAL PANEL BY PCR, STOOL (REPLACES STOOL CULTURE)

## 2021-09-06 LAB — RENAL FUNCTION PANEL
Albumin: 2.1 g/dL — ABNORMAL LOW (ref 3.5–5.0)
Anion gap: 9 (ref 5–15)
BUN: 15 mg/dL (ref 8–23)
CO2: 22 mmol/L (ref 22–32)
Calcium: 7.6 mg/dL — ABNORMAL LOW (ref 8.9–10.3)
Chloride: 98 mmol/L (ref 98–111)
Creatinine, Ser: 0.83 mg/dL (ref 0.44–1.00)
GFR, Estimated: >60 mL/min (ref >60)
Glucose, Bld: 93 mg/dL (ref 70–99)
Phosphorus: 2.4 mg/dL — ABNORMAL LOW (ref 2.5–4.6)
Potassium: 3.9 mmol/L (ref 3.5–5.1)
Sodium: 129 mmol/L — ABNORMAL LOW (ref 135–145)

## 2021-09-06 MED ORDER — TAMSULOSIN HCL 0.4 MG PO CAPS
0.4000 mg | ORAL_CAPSULE | Freq: Every day | ORAL | Status: DC
  Administered 2021-09-06 – 2021-09-13 (×8): 0.4 mg via ORAL
  Filled 2021-09-06 (×8): qty 1

## 2021-09-06 NOTE — Progress Notes (Signed)
Patient son Barbaraann Rondo is aware of transfer.  Taken by charge by bed to room.

## 2021-09-06 NOTE — Progress Notes (Signed)
Daily Progress Note  Hospital Day: 7  Chief Complaint: diverticulitis  Brief History Caitlin Chavez is a 87 y.o. female with a pmh not limited to Afib on Eliquis, HTN, large hiatal hernia on CT scan, chronic UTIs, chronic neck pain. Admitted with diverticulitis and altered mental status.   Assessment / Plan   # 87 yo female with sigmoid diverticulitis, uncomplicated. Repeat CT scan 09/03/21 done for worsening abdominal pain showed mild uncomplicated sigmoid diverticulitis, unchanged from prior CT scan. No abscess. No extraluminal or free air. She denies abdominal pain but tells me she is very tender when examined. She is indeed very tender in mid lower abdomen and Not sure what is going on, hate to do a 3rd CT scan but may need to if she remains tender.   On day # 4 of Zosyn She ate eggs and sausage for breakfast today which is encouraging   # Diarrhea, non-bloody. C-diff and stool path panel negative. Antibiotics could be contributing. Diarrhea sounds more osmotic in nature based on history I got from son and previous nurse. They both described diarrhea after eating.    Based on I+0 she only had about 450 ml stool output yesterday, so far 350 ml today.  Unless volume increases we can probably discontinue flexiseal tomorrow.    # Rectal bleeding ( started after admission).  Also with reports of dark stools at home over preceding weeks. Bleeding possibly hemorrhoidal.  Bleeding has resolved.  Colonoscopy was not recommended in setting of diverticulitis.  Continue BID PPI in setting of darker stools / prednisone + NSAID use Anusol supp were given a couple of time then appears withheld or she refused them and then they were discontinued. Since bleeding stopped will not resume suppositories for now   # Pancreatic lesion. 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 Hgb stable at 9.5 upon last check on 8/7     Subjective    No abdominal pain but tells me how much is hurts when she is examined. She ate breakfast today.   Objective   Imaging:   CT ABDOMEN PELVIS W CONTRAST  Result Date: 09/03/2021 CLINICAL DATA:  Abdominal pain.  Lethargy and weakness. EXAM: CT ABDOMEN AND PELVIS WITH CONTRAST TECHNIQUE: Multidetector CT imaging of the abdomen and pelvis was performed using the standard protocol following bolus administration of intravenous contrast. RADIATION DOSE REDUCTION: This exam was performed according to the departmental dose-optimization program which includes automated exposure control, adjustment of the mA and/or kV according to patient size and/or use of iterative reconstruction technique. CONTRAST:  16m OMNIPAQUE IOHEXOL 300 MG/ML  SOLN COMPARISON:  08/31/2021. FINDINGS: Lower chest: Dependent lung base opacities, most evident in the left lower lobe adjacent to the moderate to large hiatal hernia. Trace pericardial effusion. Trace left pleural effusion. Hepatobiliary: No focal liver abnormality is seen. Status post cholecystectomy. No biliary dilatation. 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 This was not clearly present on a CT from 10/28/2018, and not present on the CT from 11/21/2015. Is no other pancreatic abnormality. No inflammation. Spleen: Normal in size without focal abnormality. Adrenals/Urinary Tract:  No adrenal mass. Kidneys normal in size, orientation and position. 7 mm hypoattenuating cortical mass along the midpole the right kidney consistent with a cyst, stable with no follow-up recommended. No other renal masses or lesions, no stones and no hydronephrosis. Normal ureters. Normal bladder. Stomach/Bowel:  Mild wall thickening with subtle adjacent inflammation of the proximal sigmoid colon, where there are multiple diverticula, without significant change from the recent prior exam. No extraluminal or free air. No fluid collection to suggest an abscess. Remainder of the colon with no additional areas of wall thickening or inflammation. Large hiatal hernia. Stomach otherwise unremarkable. Normal small bowel. Normal appendix. Vascular/Lymphatic: Aortic atherosclerosis. No aneurysm. No enlarged lymph nodes. Reproductive: Status post hysterectomy. No adnexal masses. Other: Small fat containing periumbilical hernia, stable. No ascites. Musculoskeletal: No fracture or acute finding.  No bone lesion. IMPRESSION: 1. Mild uncomplicated sigmoid diverticulitis, unchanged from the CT performed on 08/31/2021. No abscess. No extraluminal or free air. 2. Lung base opacities, left greater than right, consistent with atelectasis. This is similar to the prior exam. Trace left pleural effusion is new from the prior study. 3. No other acute abnormality within the abdomen or pelvis. Electronically Signed   By: Lajean Manes M.D.   On: 09/03/2021 14:36   DG Abd 1 View  Result Date: 09/03/2021 CLINICAL DATA:  Abdominal pain. EXAM: ABDOMEN - 1 VIEW COMPARISON:  CT, 08/31/2021. FINDINGS: Normal bowel gas pattern. Status post cholecystectomy. No evidence of renal or ureteral stones. No acute skeletal abnormality. Left lung base opacity consistent with atelectasis. Moderate hiatal hernia. IMPRESSION: 1. No acute findings.  No evidence of bowel obstruction. Electronically Signed   By: Lajean Manes M.D.   On: 09/03/2021 09:43   DG CHEST PORT 1 VIEW  Result Date: 09/02/2021 CLINICAL DATA:  Wheezing. EXAM: PORTABLE CHEST 1 VIEW COMPARISON:  August 31, 2021 FINDINGS: Enlarged cardiac silhouette. Large hiatal hernia causes compression of the left lower lobe. Left lower lobe atelectasis versus airspace consolidation. The right lung is clear.  Osseous structures are without acute abnormality. Soft tissues are grossly normal. IMPRESSION: 1. Enlarged cardiac silhouette. Please note that the patient has a pericardial effusion measuring up to 7 mm, demonstrated on the prior abdominal CT dated August 31, 2021. Further evaluation with cardiac echo may be considered. 2. Large hiatal hernia causes compression of the left lower lobe. 3. Left lower lobe atelectasis versus airspace consolidation. Electronically Signed   By: Fidela Salisbury M.D.   On: 09/02/2021 18:30    Lab Results: Recent Labs    09/04/21 0234  WBC 9.7  HGB 9.5*  HCT 28.9*  PLT 193   BMET Recent Labs    09/04/21 0234 09/05/21 0223 09/06/21 0252  NA 127* 128* 129*  K 3.6 3.1* 3.9  CL 96* 95* 98  CO2 '22 23 22  '$ GLUCOSE 113* 99 93  BUN '14 14 15  '$ CREATININE 0.96 0.73 0.83  CALCIUM 7.9* 7.8* 7.6*   LFT Recent Labs    09/06/21 0252  ALBUMIN 2.1*   PT/INR No results for input(s): "LABPROT", "INR" in the last 72 hours.   Scheduled inpatient medications:   Chlorhexidine Gluconate Cloth  6 each Topical Daily   feeding supplement (KATE FARMS STANDARD 1.4)  325 mL Oral BID BM   gabapentin  100 mg Oral BID   lidocaine  1 patch Transdermal Q24H   melatonin  3 mg Oral QHS   metoprolol tartrate  12.5 mg Oral BID   pantoprazole (PROTONIX) IV  40  mg Intravenous Q12H   phosphorus  250 mg Oral BID   Continuous inpatient infusions:   0.9 % NaCl with KCl 20 mEq / L 50 mL/hr at 09/06/21 1155   piperacillin-tazobactam (ZOSYN)  IV 3.375 g (09/06/21 0905)   PRN inpatient medications: acetaminophen **OR** acetaminophen, albuterol, HYDROcodone-acetaminophen, lidocaine, ondansetron **OR** ondansetron (ZOFRAN) IV, mouth rinse  Vital signs in last 24 hours: Temp:  [97.5 F (36.4 C)-99.5 F (37.5 C)] 98.1 F (36.7 C) (08/09 1125) Pulse Rate:  [80-99] 87 (08/09 1125) Resp:  [14-18] 14 (08/09 1125) BP: (103-137)/(52-71) 108/54 (08/09 1125) SpO2:  [96 %-99 %] 99 % (08/09  1125) Weight:  [80.3 kg] 80.3 kg (08/09 0648) Last BM Date : 09/05/21  Intake/Output Summary (Last 24 hours) at 09/06/2021 1332 Last data filed at 09/06/2021 0034 Gross per 24 hour  Intake 840.9 ml  Output 380 ml  Net 460.9 ml     Physical Exam:  General: Alert female in NAD Heart:  Regular rate and rhythm. No lower extremity edema Pulmonary: Normal respiratory effort Abdomen: Soft, nondistended, significant mid lower abdominal tenderness.  Normal bowel sounds.  Neurologic: Alert and oriented Psych: Pleasant. Cooperative.    Intake/Output from previous day: 08/08 0701 - 08/09 0700 In: 1266.3 [P.O.:300; I.V.:640.9; IV Piggyback:325.4] Out: 1230 [Urine:800; Stool:430] Intake/Output this shift: No intake/output data recorded.    Principal Problem:   Acute metabolic encephalopathy Active Problems:   Generalized weakness   Essential hypertension   Chronic a-fib (HCC)   Sigmoid diverticulitis   Chronic anticoagulation - on eliquis for afib   Cervical pain (neck)   Hypokalemia   GI bleed     LOS: 5 days   Tye Savoy ,NP 09/06/2021, 1:32 PM

## 2021-09-06 NOTE — Care Management Important Message (Signed)
Important Message  Patient Details IM Letter placed in Patients room. Name: Caitlin Chavez MRN: [id number] Date of Birth: [birth year]   Medicare Important Message Given:  Yes     Kerin Salen 09/06/2021, 9:58 AM

## 2021-09-06 NOTE — Progress Notes (Signed)
Occupational Therapy Treatment Patient Details Name: MAHALIA DYKES MRN: [id number] DOB: September 21, 1934 Today's Date: 09/06/2021   History of present illness 87 year old female history of chronic A-fib on Eliquis, hypertension, chronic UTIs presents the ER today with confusion and weakness. She has been getting injections for neck pain recently. Sons report significant decline in her function in the last month. patient was transitioned to stepdown unit on 8/6 with sepsis protocol initated.   OT comments  Patient was noted to have increased pain with pain medication onboard at time of treatment. Patient was agreeable to attempt some movement but limited by incontinence episode at EOB. Patient returned to bed at this time for hygiene tasks. Patient would continue to benefit from skilled OT services at this time while admitted and after d/c to address noted deficits in order to improve overall safety and independence in ADLs.     Recommendations for follow up therapy are one component of a multi-disciplinary discharge planning process, led by the attending physician.  Recommendations may be updated based on patient status, additional functional criteria and insurance authorization.    Follow Up Recommendations  Skilled nursing-short term rehab (<3 hours/day)    Assistance Recommended at Discharge Frequent or constant Supervision/Assistance  Patient can return home with the following  Two people to help with walking and/or transfers;Two people to help with bathing/dressing/bathroom;Direct supervision/assist for medications management;Help with stairs or ramp for entrance;Assist for transportation;Direct supervision/assist for financial management;Assistance with cooking/housework;Assistance with feeding   Equipment Recommendations  Other (comment) (defer to next venue)    Recommendations for Other Services      Precautions / Restrictions Precautions Precautions: Fall Precaution Comments: rectal  tube Restrictions Weight Bearing Restrictions: No       Mobility Bed Mobility Overal bed mobility: Needs Assistance Bed Mobility: Sit to Supine   Sidelying to sit: Max assist   Sit to supine: Mod assist, Max assist, +2 for safety/equipment   General bed mobility comments: with increased time to transition from sit to supine with education on proper hand and foot placement.    Transfers                         Balance                                           ADL either performed or assessed with clinical judgement   ADL Overall ADL's : Needs assistance/impaired                                       General ADL Comments: patient was max A for supine to sit on edge of bed with increased reports of pain. patient was able to take a few steps up to head of bed with urinary incontinence returning to bed per patient request. patient was modA  to roll to each side in bed with increased time.    Extremity/Trunk Assessment              Vision       Perception     Praxis      Cognition Arousal/Alertness: Awake/alert Behavior During Therapy: WFL for tasks assessed/performed Overall Cognitive Status: Within Functional Limits for tasks assessed  General Comments: patient was able to follow all commands. significantly less lethargic than previous day. was noted to be tearful at times during session        Exercises      Shoulder Instructions       General Comments      Pertinent Vitals/ Pain       Pain Assessment Pain Assessment: Faces Faces Pain Scale: Hurts whole lot Pain Location: neck, back and perineal area Pain Descriptors / Indicators: Discomfort, Grimacing Pain Intervention(s): Limited activity within patient's tolerance, Monitored during session, Repositioned, Patient requesting pain meds-RN notified, Premedicated before session  Home Living                                           Prior Functioning/Environment              Frequency  Min 2X/week        Progress Toward Goals  OT Goals(current goals can now be found in the care plan section)  Progress towards OT goals: Progressing toward goals     Plan Discharge plan remains appropriate    Co-evaluation                 AM-PAC OT "6 Clicks" Daily Activity     Outcome Measure   Help from another person eating meals?: A Lot Help from another person taking care of personal grooming?: A Lot Help from another person toileting, which includes using toliet, bedpan, or urinal?: Total Help from another person bathing (including washing, rinsing, drying)?: Total Help from another person to put on and taking off regular upper body clothing?: Total Help from another person to put on and taking off regular lower body clothing?: Total 6 Click Score: 8    End of Session Equipment Utilized During Treatment: Gait belt;Rolling walker (2 wheels)  OT Visit Diagnosis: Unsteadiness on feet (R26.81);Other abnormalities of gait and mobility (R26.89);Muscle weakness (generalized) (M62.81);Feeding difficulties (R63.3)   Activity Tolerance Patient limited by fatigue   Patient Left in bed;with call bell/phone within reach;with nursing/sitter in room   Nurse Communication Patient requests pain meds        Time: 1610-9604 OT Time Calculation (min): 31 min  Charges: OT General Charges $OT Visit: 1 Visit OT Treatments $Self Care/Home Management : 8-22 mins $Therapeutic Activity: 8-22 mins  Jackelyn Poling OTR/L, MS Acute Rehabilitation Department Office# [phone] Pager# [phone]   Marcellina Millin 09/06/2021, 12:39 PM

## 2021-09-06 NOTE — Progress Notes (Signed)
PROGRESS NOTE    Caitlin Chavez  WCH:[id number] DOB: 05-28-1934 DOA: 08/31/2021 PCP: Sueanne Margarita, DO     Brief Narrative: Patient is an 87 year old female with history of chronic A-fib on Eliquis, hypertension, chronic UTIs presents the ER today with confusion and weakness.  Patient has recent history of UTI secondary to E. coli that was treated with multiple antibiotics, as well as, steroid treatment for neck pain.  Patient was admitted to await acute encephalopathy.  Hospital course has been complicated by sepsis, abdominal pain, diarrhea stools and electrolyte abnormalities.  09/04/2021: Patient seen alongside patient's son and Nurse.  No significant history from patient.  Patient remains sleepy/lethargic.  Patient continues to have diarrhea stools but now nonbloody.  No fever is reported.  Left-sided lower abdominal pain persists.  Remains on IV Zosyn.  09/05/2021: Patient seen alongside patient's son and nurse.  Patient looks better today.  Patient continues to have diarrhea.  GI panel is pending.  So far, no C. difficile.  She has history of giardiasis.  There may be antibiotics related.  Electrolyte abnormalities noted, sodium of 128, potassium of 3.1, phosphorus of 2.1 and magnesium of 1.7.    09/06/2021: Gaseous distention of the abdomen is noted.  Bladder is distended.  Bladder scan ordered stat.  Insert Foley catheter if there is confirmed retention.  Abdominal x-ray.  Continue to monitor and correct electrolytes.  Son updated. Assessment & Plan:  Principal Problem:   Acute metabolic encephalopathy Active Problems:   Generalized weakness   Essential hypertension   Chronic a-fib (HCC)   Sigmoid diverticulitis   Chronic anticoagulation - on eliquis for afib   Cervical pain (neck)   Hypokalemia   GI bleed    Assessment and Plan:  Acute metabolic encephalopathy, presents on admission  -Blood culture no growth so far, chest x-ray no acute findings, UA no sign of  infection ( urine  culture + 10,000 enterococcus, >100,000 lactobacillus, not sure if contamination or colonization, current abx should cover both though) -Confusion likely due to diverticulitis, electrolyte abnormalities -Resolved, AAOx3 09/04/2021: Likely combined metabolic and toxic encephalopathy.  Continue treatment for acute diverticulitis.  Continue volume repletion.  Apparently, patient may be intravascularly depleted.  Patient could not receive IV albumin due to concerns for allergy.  Continue to monitor on the electrolytes and replete accordingly. 09/05/2021: Encephalopathy is slowly resolving.  Patient is more communicative today.  Patient is also more awake and alert today. 09/06/2021: Continues to improve.  Sepsis developed on 8/5 pm Abx broadened to zosyn Wbc remain stable, continue to have ab pain, appear weak, kub no acute findings, will repeat CT ab/pel with oral and iv contrast, case discussed with GI  PM Addendum: Ct ab/pel showed noncomplicated diverticulitis, atelectasis and small left pleural effusion Family reports patient appear to have generalized edema today, she started to wheeze some, her blood pressure has been stable, lactic acid normalized, will try low dose lasix, ( initially plan to give albumin with lasix but there is one case report regarding albumin cause anaphylaxis reaction in patient with alpha gal, so did not give albumin) Echocardiogram ordered  09/04/2021: Sepsis physiology has resolved significantly.  Continue to manage expectantly. 09/05/2021: Patient continues to have left-sided abdominal pain.  Repeat CT scan of the abdomen and pelvis done on 09/03/2020 revealed mild uncomplicated sigmoid diverticulitis. 09/06/2021: Sepsis physiology has resolved.  Acute diverticulitis CT obtained in the ED showed uncomplicated sigmoid diverticulitis initially, repeat ct on 8/6 ordered  Received cefepime and vancomycin in  the ED, then on Keflex and Flagyl, abx changed to zosyn due to developing  sepsis on 8/5. 09/04/2021: Continue antibiotics.  He is currently on IV Zosyn. 09/05/2021: See above documentation. 09/06/2021: Left lower quadrant abdominal tenderness persists.  Patient is currently on IV Zosyn.  Acute blood loss anemia -had BRBPR  -son reports patient has been having darker stool for hte past week at home ( recent steroids exposure) -she c/o LLQ pain, no epigastric pain currently --Started on Protonix twice a day  -Hold Eliquis -h/h down trending, GI consulted, will follow recommendation 10/18/2020: H/H is stable.  Hyponatremia: -Likely prerenal. -Suspect ineffective circulatory arterial volume. -IV albumin 25 g twice daily x 48 hours -Continue to monitor sodium level -Check urine sodium, urine osmolality and serum osmolality. 09/05/2021: IV albumin was not given due to allergies.  Gentle hydration.  Aggressively nutrition, however, patient has poor appetite.   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  Rhythm is 129 today.  Hypokalemia -Potassium is 3.9. -Continue to monitor replete.  Hypomagnesemia -Continue to monitor and replete.  Hypophosphatemia: -Phosphorus today was 2.4. -Replete.   HTN, developed sepsis, hold  Coreg,  hold HCTZ, resume when appropriate  PAF, currently sinus rhythm few nonsustained V. Tach -coreg hold due to sepsis, low dose lopressor with holding parameters -Secondary hypercoagulable state from afib ,hold Eliquis in the setting of GI bleed -Keep K above 4, mag above 2 -Continue telemetry   Cervical pain (neck) -CT c spine:  No acute fracture, does has  Multilevel degenerative changes.- - prn norco ordered at family's request -resume gabapentin at low dose, titrate up if able to tolerate  -Reported lidocaine patch helped ,currently no neck pain in this position, reports neck pain is positional , no weakness in arms, no numbness or tingling in arms -was scheduled to see [facility] on day of admission but end up in the  hospital, Contacted [facility] at family 's request, [facility] recommended outpatient follow up.   FTT: Family reports patient at [facility] able to  ambulate independently , lives byherself, family comes in daily , she does not drive , no recent falls , became weak in the last few days  Son reports patient has poor appetite and 15lbs weight loss in last 3-6 months Seen by PT eval who recommend SNF,  family agrees SNF placement   Gaseous distention of the abdomen: -Abdominal x-ray. -Likely multifactorial, illness and electrolyte abnormalities.  Acute urinary retention -Insert Foley catheter.  1300 cc of urine drained. -Leave Foley catheter in situ. -Start Flomax 0.4 Mg p.o. once daily. -Follow-up with urology on discharge if acute urinary retention does not resolve.   Body mass index is 29.46 kg/m.      I have Reviewed nursing notes, Vitals, pain scores, I/o's, Lab results and  imaging results since pt's last encounter, details please see discussion above  I ordered the following labs:  Unresulted Labs (From admission, onward)     Start     Ordered   09/02/21 0500  CBC with Differential/Platelet  Tomorrow morning,   R       Question:  Specimen collection method  Answer:  Lab=Lab collect   09/01/21 1733   09/02/21 0500  Comprehensive metabolic panel  Tomorrow morning,   R       Question:  Specimen collection method  Answer:  Lab=Lab collect   09/01/21 1733   09/02/21 0500  Magnesium  Tomorrow morning,   R  Question:  Specimen collection method  Answer:  Lab=Lab collect   09/01/21 1733                                     DVT prophylaxis: SCDs Start: 09/01/21 0029   Code Status:   Code Status: DNR  Family Communication: Son at bedside daily Disposition:    Dispo: The patient is from: Home              Anticipated d/c is to:  SNF sometime next week, currently remain in stepdown.                 Antimicrobials:    Anti-infectives (From admission,  onward)    Start     Dose/Rate Route Frequency Ordered Stop   09/03/21 1600  piperacillin-tazobactam (ZOSYN) IVPB 3.375 g        3.375 g 12.5 mL/hr over 240 Minutes Intravenous Every 8 hours 09/03/21 1003     09/03/21 1100  piperacillin-tazobactam (ZOSYN) IVPB 3.375 g        3.375 g 100 mL/hr over 30 Minutes Intravenous NOW 09/03/21 1000 09/03/21 1034   09/02/21 1830  piperacillin-tazobactam (ZOSYN) IVPB 3.375 g  Status:  Discontinued        3.375 g 12.5 mL/hr over 240 Minutes Intravenous Every 8 hours 09/02/21 1816 09/03/21 1002   09/01/21 0800  cephALEXin (KEFLEX) capsule 500 mg  Status:  Discontinued        500 mg Oral Every 12 hours 09/01/21 0028 09/02/21 1803   09/01/21 0800  metroNIDAZOLE (FLAGYL) IVPB 500 mg  Status:  Discontinued        500 mg 100 mL/hr over 60 Minutes Intravenous Every 12 hours 09/01/21 0028 09/02/21 1816   08/31/21 1745  vancomycin (VANCOREADY) IVPB 1500 mg/300 mL        1,500 mg 150 mL/hr over 120 Minutes Intravenous  Once 08/31/21 1726 08/31/21 2040   08/31/21 1730  ceFEPIme (MAXIPIME) 2 g in sodium chloride 0.9 % 100 mL IVPB        2 g 200 mL/hr over 30 Minutes Intravenous  Once 08/31/21 1726 08/31/21 1836          Objective: Vitals:   09/06/21 0600 09/06/21 0644 09/06/21 0648 09/06/21 1125  BP: 122/69 137/65  (!) 108/54  Pulse: 89 94  87  Resp: '14 18  14  '$ Temp:  97.8 F (36.6 C)  98.1 F (36.7 C)  TempSrc:  Oral  Oral  SpO2: 96% 97%  99%  Weight:   80.3 kg   Height:   '5\' 5"'$  (1.651 m)     Intake/Output Summary (Last 24 hours) at 09/06/2021 1759 Last data filed at 09/06/2021 1633 Gross per 24 hour  Intake 1385.63 ml  Output 2280 ml  Net -894.37 ml    Filed Weights   08/31/21 1458 09/06/21 0648  Weight: 67.1 kg 80.3 kg    Examination:  General exam: Sleepy/lethargic.  Not in any distress.  Peripheral edema Respiratory system: Clear to auscultation. Cardiovascular system: S1-S2.   Gastrointestinal system: Obese, soft with  tenderness towards the left lower abdominal quadrant. Central nervous system: Sleepy/lethargic  Extremities: Edema of the upper and lower extremity.  Data Reviewed: I have personally reviewed  labs and visualized  imaging studies since the last encounter and formulate the plan        Scheduled Meds:  Chlorhexidine Gluconate Cloth  6  each Topical Daily   feeding supplement (KATE FARMS STANDARD 1.4)  325 mL Oral BID BM   gabapentin  100 mg Oral BID   lidocaine  1 patch Transdermal Q24H   melatonin  3 mg Oral QHS   metoprolol tartrate  12.5 mg Oral BID   pantoprazole (PROTONIX) IV  40 mg Intravenous Q12H   phosphorus  250 mg Oral BID   tamsulosin  0.4 mg Oral Daily   Continuous Infusions:  0.9 % NaCl with KCl 20 mEq / L 50 mL/hr at 09/06/21 1155   piperacillin-tazobactam (ZOSYN)  IV 3.375 g (09/06/21 1729)     LOS: 5 days     Bonnell Public, MD  [facility]  Available via Epic secure chat 7am-7pm for nonurgent issues Please page for urgent issues To page the attending provider between 7A-7P or the covering provider during after hours 7P-7A, please log into the web site [email or web address] and access using universal [place] password for that web site. If you do not have the password, please call the hospital operator.    09/06/2021, 5:59 PM

## 2021-09-07 ENCOUNTER — Inpatient Hospital Stay (HOSPITAL_COMMUNITY): Payer: Medicare Other

## 2021-09-07 DIAGNOSIS — K567 Ileus, unspecified: Secondary | ICD-10-CM

## 2021-09-07 DIAGNOSIS — G9341 Metabolic encephalopathy: Secondary | ICD-10-CM | POA: Diagnosis not present

## 2021-09-07 LAB — CBC WITH DIFFERENTIAL/PLATELET
Abs Immature Granulocytes: 0.62 10*3/uL — ABNORMAL HIGH (ref 0.00–0.07)
Basophils Absolute: 0.1 10*3/uL (ref 0.0–0.1)
Basophils Relative: 1 %
Eosinophils Absolute: 0.1 10*3/uL (ref 0.0–0.5)
Eosinophils Relative: 1 %
HCT: 24.8 % — ABNORMAL LOW (ref 36.0–46.0)
Hemoglobin: 8.2 g/dL — ABNORMAL LOW (ref 12.0–15.0)
Immature Granulocytes: 7 %
Lymphocytes Relative: 11 %
Lymphs Abs: 1 10*3/uL (ref 0.7–4.0)
MCH: 31.2 pg (ref 26.0–34.0)
MCHC: 33.1 g/dL (ref 30.0–36.0)
MCV: 94.3 fL (ref 80.0–100.0)
Monocytes Absolute: 0.6 10*3/uL (ref 0.1–1.0)
Monocytes Relative: 7 %
Neutro Abs: 6.2 10*3/uL (ref 1.7–7.7)
Neutrophils Relative %: 73 %
Platelets: 230 10*3/uL (ref 150–400)
RBC: 2.63 MIL/uL — ABNORMAL LOW (ref 3.87–5.11)
RDW: 14.3 % (ref 11.5–15.5)
WBC: 8.6 10*3/uL (ref 4.0–10.5)
nRBC: 0 % (ref 0.0–0.2)

## 2021-09-07 LAB — RENAL FUNCTION PANEL
Albumin: 1.8 g/dL — ABNORMAL LOW (ref 3.5–5.0)
Anion gap: 9 (ref 5–15)
BUN: 12 mg/dL (ref 8–23)
CO2: 21 mmol/L — ABNORMAL LOW (ref 22–32)
Calcium: 7.7 mg/dL — ABNORMAL LOW (ref 8.9–10.3)
Chloride: 100 mmol/L (ref 98–111)
Creatinine, Ser: 0.78 mg/dL (ref 0.44–1.00)
GFR, Estimated: >60 mL/min (ref >60)
Glucose, Bld: 86 mg/dL (ref 70–99)
Phosphorus: 3.1 mg/dL (ref 2.5–4.6)
Potassium: 3.7 mmol/L (ref 3.5–5.1)
Sodium: 130 mmol/L — ABNORMAL LOW (ref 135–145)

## 2021-09-07 LAB — CULTURE, BLOOD (ROUTINE X 2)
Culture: NO GROWTH
Culture: NO GROWTH
Special Requests: ADEQUATE
Special Requests: ADEQUATE

## 2021-09-07 LAB — MAGNESIUM: Magnesium: 1.7 mg/dL (ref 1.7–2.4)

## 2021-09-07 MED ORDER — MAGNESIUM SULFATE 2 GM/50ML IV SOLN
2.0000 g | Freq: Once | INTRAVENOUS | Status: AC
  Administered 2021-09-07: 2 g via INTRAVENOUS
  Filled 2021-09-07: qty 50

## 2021-09-07 MED ORDER — BOOST / RESOURCE BREEZE PO LIQD CUSTOM
1.0000 | Freq: Three times a day (TID) | ORAL | Status: DC
  Administered 2021-09-08 (×2): 1 via ORAL

## 2021-09-07 MED ORDER — POTASSIUM CHLORIDE CRYS ER 20 MEQ PO TBCR
40.0000 meq | EXTENDED_RELEASE_TABLET | Freq: Once | ORAL | Status: AC
  Administered 2021-09-07: 40 meq via ORAL
  Filled 2021-09-07: qty 2

## 2021-09-07 NOTE — Progress Notes (Signed)
Physical Therapy Treatment Patient Details Name: Caitlin Chavez MRN: [id number] DOB: [birth year] Today's Date: 09/07/2021   History of Present Illness 87 year old female history of chronic A-fib on Eliquis, hypertension, chronic UTIs presents the ER today with confusion and weakness. She has been getting injections for neck pain recently. Sons report significant decline in her function in the last month. patient was transitioned to stepdown unit on 8/6 with sepsis protocol initated.    PT Comments    General Comments: AxO x2 following all commands but feels "weak".  2 sons present during session and assisted.  Pt already OOB in recliner.  General transfer comment: + 2 side by side assist from elevated bed and 75% VC's on proper hand placement.  Also required asisst to control asisst to sit. General Gait Details: very limited distance 10 feet from recliner which I parked in hallway outside her room to bed.  VERY weak. General bed mobility comments: Max/Total Asisst back to bed to support B LE and position RIGHT sidlying for comfort.  Pt will need SNF.   Recommendations for follow up therapy are one component of a multi-disciplinary discharge planning process, led by the attending physician.  Recommendations may be updated based on patient status, additional functional criteria and insurance authorization.  Follow Up Recommendations  Skilled nursing-short term rehab (<3 hours/day)     Assistance Recommended at Discharge    Patient can return home with the following Two people to help with walking and/or transfers   Equipment Recommendations  None recommended by PT    Recommendations for Other Services       Precautions / Restrictions Precautions Precautions: Fall Precaution Comments: rectal tube Restrictions Weight Bearing Restrictions: No     Mobility  Bed Mobility Overal bed mobility: Needs Assistance Bed Mobility: Sit to Supine       Sit to supine: Max assist, Total assist,  +2 for physical assistance, +2 for safety/equipment   General bed mobility comments: Max/Total Asisst back to bed to support B LE and position RIGHT sidlying for comfort.    Transfers Overall transfer level: Needs assistance Equipment used: Rolling walker (2 wheels) Transfers: Sit to/from Stand Sit to Stand: Mod assist, Max assist, +2 physical assistance, +2 safety/equipment           General transfer comment: + 2 side by side assist from elevated bed and 75% VC's on proper hand placement.  Also required asisst to control asisst to sit.    Ambulation/Gait Ambulation/Gait assistance: +2 physical assistance, +2 safety/equipment, Max assist Gait Distance (Feet): 10 Feet Assistive device: Rolling walker (2 wheels) Gait Pattern/deviations: Step-to pattern, Trunk flexed Gait velocity: decreased     General Gait Details: very limited distance 10 feet from recliner which I parked in hallway outside her room to bed.  VERY weak.   Stairs             Wheelchair Mobility    Modified Rankin (Stroke Patients Only)       Balance                                            Cognition Arousal/Alertness: Awake/alert Behavior During Therapy: WFL for tasks assessed/performed                                   General  Comments: AxO x2 following all commands but feels "weak".        Exercises      General Comments        Pertinent Vitals/Pain Pain Assessment Pain Assessment: Faces Faces Pain Scale: Hurts a little bit Pain Location: neck, back and perineal area Pain Descriptors / Indicators: Discomfort, Grimacing Pain Intervention(s): Monitored during session    Home Living                          Prior Function            PT Goals (current goals can now be found in the care plan section) Progress towards PT goals: Progressing toward goals    Frequency    Min 2X/week      PT Plan Current plan remains  appropriate    Co-evaluation              AM-PAC PT "6 Clicks" Mobility   Outcome Measure  Help needed turning from your back to your side while in a flat bed without using bedrails?: A Lot Help needed moving from lying on your back to sitting on the side of a flat bed without using bedrails?: A Lot Help needed moving to and from a bed to a chair (including a wheelchair)?: A Lot Help needed standing up from a chair using your arms (e.g., wheelchair or bedside chair)?: A Lot Help needed to walk in hospital room?: A Lot Help needed climbing 3-5 steps with a railing? : Total 6 Click Score: 11    End of Session Equipment Utilized During Treatment: Gait belt Activity Tolerance: Patient limited by fatigue Patient left: in bed;with call bell/phone within reach;with bed alarm set;with family/visitor present Nurse Communication: Mobility status PT Visit Diagnosis: Muscle weakness (generalized) (M62.81);Pain;Difficulty in walking, not elsewhere classified (R26.2)     Time: 1045-1100 PT Time Calculation (min) (ACUTE ONLY): 15 min  Charges:  $Gait Training: 8-22 mins                     Rica Koyanagi  PTA [place] Office M-F          [phone] Weekend pager [phone]

## 2021-09-07 NOTE — Progress Notes (Signed)
PROGRESS NOTE    Caitlin Chavez  NUU:[id number] DOB: [birth year] DOA: 08/31/2021 PCP: Sueanne Margarita, DO     Brief Narrative: Patient is an 87 year old female with history of chronic A-fib on Eliquis, hypertension, chronic UTIs presents the ER today with confusion and weakness.  Patient has recent history of UTI secondary to E. coli that was treated with multiple antibiotics, as well as, steroid treatment for neck pain.  Patient was admitted to await acute encephalopathy.  Hospital course has been complicated by sepsis, abdominal pain, diarrhea stools and electrolyte abnormalities.  09/04/2021: Patient seen alongside patient's son and Nurse.  No significant history from patient.  Patient remains sleepy/lethargic.  Patient continues to have diarrhea stools but now nonbloody.  No fever is reported.  Left-sided lower abdominal pain persists.  Remains on IV Zosyn.  09/05/2021: Patient seen alongside patient's son and nurse.  Patient looks better today.  Patient continues to have diarrhea.  GI panel is pending.  So far, no C. difficile.  She has history of giardiasis.  There may be antibiotics related.  Electrolyte abnormalities noted, sodium of 128, potassium of 3.1, phosphorus of 2.1 and magnesium of 1.7.    09/06/2021: Gaseous distention of the abdomen is noted.  Bladder is distended.  Bladder scan ordered stat.  Insert Foley catheter if there is confirmed retention.  Abdominal x-ray.  Continue to monitor and correct electrolytes.  Son updated.  09/07/2021: Patient seen alongside patient's 2 sons.  Had extensive discussion with patient's 2 sons.  All questions answered.  Will consult palliative care, but not emergently.  Palliative care team will help family to define goals of care.  Physical therapy input is appreciated.  Likely DC to skilled nursing facility for short-term rehab.  X-ray revealed ileus.  Foley catheter in place for urinary retention.  Left lower abdominal tenderness persists, but seems to  have improved slightly.  Difficult decision to proceed with repeat CT scan of the abdomen and pelvis, considering patient has had 2 prior CT scan of the abdomen and pelvis.    Assessment & Plan:  Principal Problem:   Acute metabolic encephalopathy Active Problems:   Generalized weakness   Essential hypertension   Chronic a-fib (HCC)   Sigmoid diverticulitis   Chronic anticoagulation - on eliquis for afib   Cervical pain (neck)   Hypokalemia   GI bleed    Assessment and Plan:  Acute metabolic encephalopathy, presents on admission  -Blood culture no growth so far, chest x-ray no acute findings, UA no sign of  infection ( urine culture + 10,000 enterococcus, >100,000 lactobacillus, not sure if contamination or colonization, current abx should cover both though) -Confusion likely due to diverticulitis, electrolyte abnormalities -Resolved, AAOx3 09/04/2021: Likely combined metabolic and toxic encephalopathy.  Continue treatment for acute diverticulitis.  Continue volume repletion.  Apparently, patient may be intravascularly depleted.  Patient could not receive IV albumin due to concerns for allergy.  Continue to monitor on the electrolytes and replete accordingly. 09/05/2021: Encephalopathy is slowly resolving.  Patient is more communicative today.  Patient is also more awake and alert today. 09/06/2021: Continues to improve. 09/07/2021: Continues to improve.  Sepsis developed on 8/5 pm Abx broadened to zosyn Wbc remain stable, continue to have ab pain, appear weak, kub no acute findings, will repeat CT ab/pel with oral and iv contrast, case discussed with GI  PM Addendum: Ct ab/pel showed noncomplicated diverticulitis, atelectasis and small left pleural effusion Family reports patient appear to have generalized edema today, she started  to wheeze some, her blood pressure has been stable, lactic acid normalized, will try low dose lasix, ( initially plan to give albumin with lasix but there is  one case report regarding albumin cause anaphylaxis reaction in patient with alpha gal, so did not give albumin) Echocardiogram ordered  09/04/2021: Sepsis physiology has resolved significantly.  Continue to manage expectantly. 09/05/2021: Patient continues to have left-sided abdominal pain.  Repeat CT scan of the abdomen and pelvis done on 09/03/2020 revealed mild uncomplicated sigmoid diverticulitis. 09/06/2021: Sepsis physiology has resolved.  Acute diverticulitis CT obtained in the ED showed uncomplicated sigmoid diverticulitis initially, repeat ct on 8/6 ordered  Received cefepime and vancomycin in the ED, then on Keflex and Flagyl, abx changed to zosyn due to developing sepsis on 8/5. 09/04/2021: Continue antibiotics.  He is currently on IV Zosyn. 09/05/2021: See above documentation. 09/07/2021: Left lower quadrant abdominal tenderness persists, but with slight improvement.  Patient is currently on IV Zosyn.  Acute blood loss anemia -had BRBPR  -son reports patient has been having darker stool for hte past week at home ( recent steroids exposure) -she c/o LLQ pain, no epigastric pain currently --Started on Protonix twice a day  -Hold Eliquis -h/h down trending, GI consulted, will follow recommendation 09/07/2021: Hemoglobin is 8.2 g/dL today.    Hyponatremia: -Likely prerenal. -Suspect ineffective circulatory arterial volume. -IV albumin 25 g twice daily x 48 hours -Continue to monitor sodium level -Check urine sodium, urine osmolality and serum osmolality. 09/05/2021: IV albumin was not given due to allergies.  Gentle hydration.  Aggressively nutrition, however, patient has poor appetite.   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  Sodium today is 130.    Hypokalemia -Potassium is 3.7. -Continue to monitor replete. -Will give K-Dur 40 mEq p.o. x 1 dose.  Patient has ileus.  Hypomagnesemia -Continue to monitor and replete. -Magnesium today is 1.7. -Will give IV magnesium 2 g x 1  dose.  Hypophosphatemia: -Phosphorus today was 3.1. -Continue to monitor and replete.     HTN, developed sepsis, hold  Coreg,  hold HCTZ, resume when appropriate  PAF, currently sinus rhythm few nonsustained V. Tach -coreg hold due to sepsis, low dose lopressor with holding parameters -Secondary hypercoagulable state from afib ,hold Eliquis in the setting of GI bleed -Keep K above 4, mag above 2 -Continue telemetry   Cervical pain (neck) -CT c spine:  No acute fracture, does has  Multilevel degenerative changes.- - prn norco ordered at family's request -resume gabapentin at low dose, titrate up if able to tolerate  -Reported lidocaine patch helped ,currently no neck pain in this position, reports neck pain is positional , no weakness in arms, no numbness or tingling in arms -was scheduled to see [facility] on day of admission but end up in the hospital, Contacted [facility] at family 's request, [facility] recommended outpatient follow up.   FTT: Family reports patient at [facility] able to  ambulate independently , lives byherself, family comes in daily , she does not drive , no recent falls , became weak in the last few days  Son reports patient has poor appetite and 15lbs weight loss in last 3-6 months Seen by PT eval who recommend SNF,  family agrees SNF placement   Gaseous distention of the abdomen: -Abdominal x-ray. -Likely multifactorial, illness and electrolyte abnormalities.  Acute urinary retention -Insert Foley catheter.  1300 cc of urine drained. -Leave Foley catheter in situ. -Start Flomax 0.4 Mg p.o. once daily. -Follow-up  with urology on discharge if acute urinary retention does not resolve.   Body mass index is 29.46 kg/m.      I have Reviewed nursing notes, Vitals, pain scores, I/o's, Lab results and  imaging results since pt's last encounter, details please see discussion above  I ordered the following labs:  Unresulted Labs (From admission,  onward)     Start     Ordered   09/02/21 0500  CBC with Differential/Platelet  Tomorrow morning,   R       Question:  Specimen collection method  Answer:  Lab=Lab collect   09/01/21 1733   09/02/21 0500  Comprehensive metabolic panel  Tomorrow morning,   R       Question:  Specimen collection method  Answer:  Lab=Lab collect   09/01/21 1733   09/02/21 0500  Magnesium  Tomorrow morning,   R       Question:  Specimen collection method  Answer:  Lab=Lab collect   09/01/21 1733                                     DVT prophylaxis: SCDs Start: 09/01/21 0029   Code Status:   Code Status: DNR  Family Communication: Son at bedside daily Disposition:    Dispo: The patient is from: Home              Anticipated d/c is to:  SNF sometime next week, currently remain in stepdown.                 Antimicrobials:    Anti-infectives (From admission, onward)    Start     Dose/Rate Route Frequency Ordered Stop   09/03/21 1600  piperacillin-tazobactam (ZOSYN) IVPB 3.375 g        3.375 g 12.5 mL/hr over 240 Minutes Intravenous Every 8 hours 09/03/21 1003     09/03/21 1100  piperacillin-tazobactam (ZOSYN) IVPB 3.375 g        3.375 g 100 mL/hr over 30 Minutes Intravenous NOW 09/03/21 1000 09/03/21 1034   09/02/21 1830  piperacillin-tazobactam (ZOSYN) IVPB 3.375 g  Status:  Discontinued        3.375 g 12.5 mL/hr over 240 Minutes Intravenous Every 8 hours 09/02/21 1816 09/03/21 1002   09/01/21 0800  cephALEXin (KEFLEX) capsule 500 mg  Status:  Discontinued        500 mg Oral Every 12 hours 09/01/21 0028 09/02/21 1803   09/01/21 0800  metroNIDAZOLE (FLAGYL) IVPB 500 mg  Status:  Discontinued        500 mg 100 mL/hr over 60 Minutes Intravenous Every 12 hours 09/01/21 0028 09/02/21 1816   08/31/21 1745  vancomycin (VANCOREADY) IVPB 1500 mg/300 mL        1,500 mg 150 mL/hr over 120 Minutes Intravenous  Once 08/31/21 1726 08/31/21 2040   08/31/21 1730  ceFEPIme (MAXIPIME) 2 g in sodium  chloride 0.9 % 100 mL IVPB        2 g 200 mL/hr over 30 Minutes Intravenous  Once 08/31/21 1726 08/31/21 1836          Objective: Vitals:   09/06/21 1125 09/06/21 1943 09/07/21 0501 09/07/21 1339  BP: (!) 108/54 (!) 121/58 (!) 152/78 128/67  Pulse: 87 (!) 101 95 94  Resp: '14 17 17 18  '$ Temp: 98.1 F (36.7 C)  98.3 F (36.8 C) 98.5 F (36.9 C)  TempSrc: Oral  Oral  SpO2: 99% 100% 97% 100%  Weight:      Height:        Intake/Output Summary (Last 24 hours) at 09/07/2021 1755 Last data filed at 09/07/2021 1743 Gross per 24 hour  Intake 1406.76 ml  Output 1350 ml  Net 56.76 ml    Filed Weights   08/31/21 1458 09/06/21 0648  Weight: 67.1 kg 80.3 kg    Examination:  General exam: Sleepy/lethargic.  Not in any distress.  Peripheral edema Respiratory system: Clear to auscultation. Cardiovascular system: S1-S2.   Gastrointestinal system: Obese, soft with tenderness towards the left lower abdominal quadrant. Central nervous system: Sleepy/lethargic  Extremities: Edema of the upper and lower extremity.  Data Reviewed: I have personally reviewed  labs and visualized  imaging studies since the last encounter and formulate the plan        Scheduled Meds:  Chlorhexidine Gluconate Cloth  6 each Topical Daily   feeding supplement (KATE FARMS STANDARD 1.4)  325 mL Oral BID BM   gabapentin  100 mg Oral BID   lidocaine  1 patch Transdermal Q24H   melatonin  3 mg Oral QHS   metoprolol tartrate  12.5 mg Oral BID   pantoprazole (PROTONIX) IV  40 mg Intravenous Q12H   tamsulosin  0.4 mg Oral Daily   Continuous Infusions:  0.9 % NaCl with KCl 20 mEq / L 50 mL/hr at 09/07/21 0808   piperacillin-tazobactam (ZOSYN)  IV 3.375 g (09/07/21 1504)     LOS: 6 days   Time spent: 55 minutes.   Bonnell Public, MD  [facility]  Available via Epic secure chat 7am-7pm for nonurgent issues Please page for urgent issues To page the attending provider between 7A-7P or the  covering provider during after hours 7P-7A, please log into the web site [email or web address] and access using universal [place] password for that web site. If you do not have the password, please call the hospital operator.    09/07/2021, 5:55 PM

## 2021-09-07 NOTE — Progress Notes (Signed)
Patient ID: Caitlin Chavez, female   DOB: 1934/04/05, 87 y.o.   MRN: [id number]    Progress Note   Subjective   Day # 7  CC; acute diverticulitis, altered mental status  Foley placed last p.m. secondary to high postvoid residual  Loose stool in Flexi-Seal/rectal tube  IV Zosyn  Labs today-WBC 8.6/hemoglobin 8.2/hematocrit 24.8  Patient says she really does not feel any better today, still complaining of ongoing lower abdominal discomfort, some distention, no nausea or vomiting. She is sitting up in the chair, family at bedside   Objective   Vital signs in last 24 hours: Temp:  [98.1 F (36.7 C)-98.3 F (36.8 C)] 98.3 F (36.8 C) (08/10 0501) Pulse Rate:  [87-101] 95 (08/10 0501) Resp:  [14-17] 17 (08/10 0501) BP: (108-152)/(54-78) 152/78 (08/10 0501) SpO2:  [97 %-100 %] 97 % (08/10 0501) Last BM Date : 09/06/21 General:    Elderly white female in NAD in chair Heart:  Regular rate and rhythm; no murmurs Lungs: Respirations even and unlabored, lungs CTA bilaterally Abdomen: Protuberant, somewhat distended, tympanitic with high-pitched tinkling bowel sounds.  Rather diffusely tender across the mid and lower abdomen no rebound  Extremities:  Without edema. Neurologic:  Alert and oriented,  grossly normal neurologically. Psych:  Cooperative. Normal mood and affect.  Intake/Output from previous day: 08/09 0701 - 08/10 0700 In: 1539 [P.O.:360; I.V.:1179] Out: 2750 [Urine:2150; Stool:600] Intake/Output this shift: Total I/O In: 88.9 [I.V.:88.9] Out: -   Lab Results: Recent Labs    09/07/21 0444  WBC 8.6  HGB 8.2*  HCT 24.8*  PLT 230   BMET Recent Labs    09/05/21 0223 09/06/21 0252 09/07/21 0444  NA 128* 129* 130*  K 3.1* 3.9 3.7  CL 95* 98 100  CO2 23 22 21*  GLUCOSE 99 93 86  BUN '14 15 12  '$ CREATININE 0.73 0.83 0.78  CALCIUM 7.8* 7.6* 7.7*   LFT Recent Labs    09/07/21 0444  ALBUMIN 1.8*   PT/INR No results for input(s): "LABPROT", "INR" in the  last 72 hours.  Studies/Results: DG Abd 1 View  Result Date: 09/06/2021 CLINICAL DATA:  Distension of the abdomen. EXAM: ABDOMEN - 1 VIEW COMPARISON:  September 03, 2021 FINDINGS: The bowel gas pattern is normal. No radio-opaque calculi or other significant radiographic abnormality are seen. Nonspecific sub pathologic distension of the colon. IMPRESSION: 1. Nonobstructive bowel gas pattern. 2. Nonspecific sub pathologic gaseous distension of the colon. Electronically Signed   By: Fidela Salisbury M.D.   On: 09/06/2021 16:55       Assessment / Plan:    #16 87 year old white female with acute uncomplicated sigmoid diverticulitis admitted 6 days ago On IV Zosyn Blood cultures negative. She has had persistent abdominal pain past the lower abdomen. Foley placed last evening for high postvoid residual no significant improvement in her complaints of lower abdominal pain  On exam today findings consistent with ileus, KUB yesterday with suggestion of nonspecific bowel dilation.  #2 diarrhea-non bloody-probably antibiotic associated, GI path panel and C. 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        Principal Problem:  Acute metabolic encephalopathy Active Problems:   Generalized weakness   Essential hypertension   Chronic a-fib (HCC)   Sigmoid diverticulitis   Chronic anticoagulation - on eliquis for afib   Cervical pain (neck)   Hypokalemia   GI bleed     LOS: 6 days   Grazia Taffe PA-C 09/07/2021, 9:16 AM

## 2021-09-07 NOTE — Plan of Care (Signed)
  Problem: Clinical Measurements: Goal: Ability to maintain clinical measurements within normal limits will improve Outcome: Progressing   Problem: Activity: Goal: Risk for activity intolerance will decrease Outcome: Progressing   Problem: Coping: Goal: Level of anxiety will decrease Outcome: Progressing   

## 2021-09-08 ENCOUNTER — Inpatient Hospital Stay (HOSPITAL_COMMUNITY): Payer: Medicare Other

## 2021-09-08 DIAGNOSIS — K8689 Other specified diseases of pancreas: Secondary | ICD-10-CM

## 2021-09-08 DIAGNOSIS — I482 Chronic atrial fibrillation, unspecified: Secondary | ICD-10-CM | POA: Diagnosis not present

## 2021-09-08 DIAGNOSIS — M542 Cervicalgia: Secondary | ICD-10-CM | POA: Diagnosis not present

## 2021-09-08 DIAGNOSIS — K5732 Diverticulitis of large intestine without perforation or abscess without bleeding: Secondary | ICD-10-CM | POA: Diagnosis not present

## 2021-09-08 DIAGNOSIS — G9341 Metabolic encephalopathy: Secondary | ICD-10-CM | POA: Diagnosis not present

## 2021-09-08 LAB — RENAL FUNCTION PANEL
Albumin: 2 g/dL — ABNORMAL LOW (ref 3.5–5.0)
Anion gap: 7 (ref 5–15)
BUN: 12 mg/dL (ref 8–23)
CO2: 24 mmol/L (ref 22–32)
Calcium: 7.8 mg/dL — ABNORMAL LOW (ref 8.9–10.3)
Chloride: 102 mmol/L (ref 98–111)
Creatinine, Ser: 0.74 mg/dL (ref 0.44–1.00)
GFR, Estimated: >60 mL/min (ref >60)
Glucose, Bld: 92 mg/dL (ref 70–99)
Phosphorus: 3 mg/dL (ref 2.5–4.6)
Potassium: 4.2 mmol/L (ref 3.5–5.1)
Sodium: 133 mmol/L — ABNORMAL LOW (ref 135–145)

## 2021-09-08 MED ORDER — LOPERAMIDE HCL 2 MG PO CAPS
2.0000 mg | ORAL_CAPSULE | Freq: Two times a day (BID) | ORAL | Status: DC
  Administered 2021-09-08 – 2021-09-12 (×9): 2 mg via ORAL
  Filled 2021-09-08 (×9): qty 1

## 2021-09-08 MED ORDER — PANTOPRAZOLE SODIUM 40 MG PO TBEC
40.0000 mg | DELAYED_RELEASE_TABLET | Freq: Every day | ORAL | Status: DC
  Administered 2021-09-09 – 2021-09-13 (×5): 40 mg via ORAL
  Filled 2021-09-08 (×5): qty 1

## 2021-09-08 NOTE — Consult Note (Signed)
Consultation Note Date: 09/08/2021   Patient Name: Caitlin Chavez  DOB: [birth year]  MRN: [id number]  Age / Sex: 87 y.o., female  PCP: Sueanne Margarita, DO Referring Physician: Kerney Elbe, DO  Reason for Consultation: Establishing goals of care  HPI/Patient Profile: 87 y.o. female   admitted on 08/31/2021    Clinical Assessment and Goals of Care:  87 year old white female with acute sigmoid diverticulitis, altered mental status on admission which has since resolved, recent abdominal imaging with ileus, has history of  chronic A-fib on Eliquis, hypertension, chronic UTIs. Lives in [place], [place] has 2 sons.  PMT consult for goals of care discussions.  Chart reviewed, patient seen and examined. PT has recommended SNF rehab attempt. On liquid diet, on antibiotics, GI colleagues also following.  Patient is awake alert resting in bed, she states that at present, she has no abdominal pain.  Palliative medicine is specialized medical care for people living with serious illness. It focuses on providing relief from the symptoms and stress of a serious illness. The goal is to improve quality of life for both the patient and the family. Goals of care: Broad aims of medical therapy in relation to the patient's values and preferences. Our aim is to provide medical care aimed at enabling patients to achieve the goals that matter most to them, given the circumstances of their particular medical situation and their constraints.      NEXT OF KIN  2 sons.   SUMMARY OF RECOMMENDATIONS   Agree with DNR Continue current mode of care Recommend SNF rehab with palliative on discharge No further PMT specific recommendations at this time Thank you for the consult.   Code Status/Advance Care Planning: DNR   Symptom Management:     Palliative Prophylaxis:  Delirium Protocol  Psycho-social/Spiritual:  Desire for  further Chaplaincy support:yes Additional Recommendations: Caregiving  Support/Resources  Prognosis:  Unable to determine  Discharge Planning: [place] for rehab with Palliative care service follow-up      Primary Diagnoses: Present on Admission:  Acute metabolic encephalopathy  Essential hypertension  Chronic a-fib (HCC)  GI bleed   I have reviewed the medical record, interviewed the patient and family, and examined the patient. The following aspects are pertinent.  Past Medical History:  Diagnosis Date   Anxiety    Arthritis    hands   Carpal tunnel syndrome    Colon polyps    Diverticulosis    FUO (fever of unknown origin) 10/31/2018   GERD (gastroesophageal reflux disease)    Hiatal hernia    Hypertension    Macular degeneration    Peripheral neuropathy    Social History   Socioeconomic History   Marital status: Single    Spouse name: Not on file   Number of children: 2   Years of education: Not on file   Highest education level: Not on file  Occupational History   Occupation: retired    Comment: banker  Tobacco Use   Smoking status: Never   Smokeless tobacco:  Never  Vaping Use   Vaping Use: Never used  Substance and Sexual Activity   Alcohol use: No   Drug use: No   Sexual activity: Never  Other Topics Concern   Not on file  Social History Narrative   Widow.  Lives alone.  5 grandchildren and 5 great grandchildren.    Social Determinants of Health   Financial Resource Strain: Not on file  Food Insecurity: Not on file  Transportation Needs: Not on file  Physical Activity: Not on file  Stress: Not on file  Social Connections: Not on file   Family History  Problem Relation Age of Onset   Heart attack Father 16   Alcohol abuse Father    Heart failure Mother 44       chf   Heart disease Mother    Stomach cancer Maternal Grandmother    Prostate cancer Son    Colon cancer Neg Hx    Scheduled Meds:  Chlorhexidine Gluconate  Cloth  6 each Topical Daily   feeding supplement  1 Container Oral TID BM   feeding supplement (KATE FARMS STANDARD 1.4)  325 mL Oral BID BM   gabapentin  100 mg Oral BID   lidocaine  1 patch Transdermal Q24H   melatonin  3 mg Oral QHS   metoprolol tartrate  12.5 mg Oral BID   pantoprazole (PROTONIX) IV  40 mg Intravenous Q12H   tamsulosin  0.4 mg Oral Daily   Continuous Infusions:  0.9 % NaCl with KCl 20 mEq / L 50 mL/hr at 09/08/21 0536   piperacillin-tazobactam (ZOSYN)  IV 3.375 g (09/08/21 0813)   PRN Meds:.acetaminophen **OR** acetaminophen, albuterol, HYDROcodone-acetaminophen, lidocaine, ondansetron **OR** ondansetron (ZOFRAN) IV, mouth rinse Medications Prior to Admission:  Prior to Admission medications   Medication Sig Start Date End Date Taking? Authorizing Provider  allopurinol (ZYLOPRIM) 100 MG tablet Take 100 mg by mouth 2 (two) times daily.   Yes [provider]  ALPRAZolam Duanne Moron) 0.5 MG tablet Take 0.5 mg by mouth 2 (two) times daily as needed for anxiety.   Yes [provider]  apixaban (ELIQUIS) 5 MG TABS tablet Take 1 tablet (5 mg total) by mouth 2 (two) times daily. 12/15/18  Yes Harold Hedge, MD  carvedilol (COREG) 6.25 MG tablet Take 6.25 mg by mouth 2 (two) times daily with a meal.  07/11/15  Yes [provider]  cholecalciferol (VITAMIN D) 25 MCG (1000 UT) tablet Take 2,000 Units by mouth daily.   Yes [provider]  EPINEPHrine 0.3 mg/0.3 mL IJ SOAJ injection Inject 0.3 mg into the muscle as needed. 01/14/20  Yes Garnet Sierras, DO  gabapentin (NEURONTIN) 300 MG capsule Take 300 mg by mouth 2 (two) times daily. 07/11/15  Yes [provider]  hydrochlorothiazide (MICROZIDE) 12.5 MG capsule Take 12.5 mg by mouth daily.   Yes [provider]  lidocaine (LIDODERM) 5 % Place 1 patch onto the skin daily as needed (For neck pain).   Yes [provider]  Menthol, Topical Analgesic, (BIOFREEZE) 4 % GEL Apply 1  application  topically daily as needed (For knee pain).   Yes [provider]  Multiple Vitamins-Minerals (OCUVITE PO) Take 1 tablet by mouth daily.    Yes [provider]  omeprazole (PRILOSEC) 40 MG capsule Take 40 mg by mouth daily. 11/18/19  Yes [provider]  Potassium 99 MG TABS Take 1 tablet by mouth 2 (two) times daily.   Yes [provider]  Probiotic Product (PROBIOTIC PO) Take 1 tablet by mouth daily.   Yes [provider]  primidone (MYSOLINE) 50 MG tablet 3 in AM, 2 in PM Patient not taking: Reported on 08/31/2021 05/30/18   Tat, Eustace Quail, DO   Allergies  Allergen Reactions   Alka-Seltzer [Aspirin Effervescent] Anaphylaxis and Hives   Alpha-Gal Anaphylaxis    NO MEAT, meat derived products   Beef-Derived Products Anaphylaxis   Nsaids Anaphylaxis and Hives   Other Anaphylaxis and Rash    Alpha-gal allergy, NO MEAT   Pork-Derived Products Anaphylaxis   Codeine Hives and Nausea And Vomiting   Review of Systems Denies nausea or vomiting.   Physical Exam Elderly lady Resting in bed Has rectal tube No distress Regular work of breathing Mild generalized abdominal tenderness No edema Non focal  Vital Signs: BP (!) 142/86 (BP Location: Right Arm)   Pulse 87   Temp 98.2 F (36.8 C) (Oral)   Resp 18   Ht '5\' 5"'$  (1.651 m)   Wt 80.3 kg   SpO2 97%   BMI 29.46 kg/m  Pain Scale: 0-10 POSS *See Group Information*: 1-Acceptable,Awake and alert Pain Score: Asleep   SpO2: SpO2: 97 % O2 Device:SpO2: 97 % O2 Flow Rate: .   IO: Intake/output summary:  Intake/Output Summary (Last 24 hours) at 09/08/2021 1127 Last data filed at 09/08/2021 0535 Gross per 24 hour  Intake 984.83 ml  Output 1450 ml  Net -465.17 ml    LBM: Last BM Date : 09/08/21 Baseline Weight: Weight: 67.1 kg Most recent weight: Weight: 80.3 kg     Palliative Assessment/Data:   PPS 50%  Time In:  1400 Time Out:  1500 Time Total:  60 Greater than 50%   of this time was spent counseling and coordinating care related to the above assessment and plan.  Signed by: Loistine Chance, MD   Please contact Palliative Medicine Team phone at [phone] for questions and concerns.  For individual provider: See Shea Evans

## 2021-09-08 NOTE — Care Management Important Message (Signed)
Important Message  Patient Details IM Letter placed in Patients room. Name: Caitlin Chavez MRN: [id number] Date of Birth: 07/03/1934   Medicare Important Message Given:  Yes     Kerin Salen 09/08/2021, 12:06 PM

## 2021-09-08 NOTE — Plan of Care (Signed)

## 2021-09-08 NOTE — Progress Notes (Signed)
PROGRESS NOTE    Caitlin Chavez  RPR:[id number] DOB: Jun 11, 1934 DOA: 08/31/2021 PCP: Sueanne Margarita, DO   Brief Narrative:  Patient is an 87 year old female with history of chronic A-fib on Eliquis, hypertension, chronic UTIs presents the ER today with confusion and weakness.  Patient has recent history of UTI secondary to E. coli that was treated with multiple antibiotics, as well as, steroid treatment for neck pain.  Patient was admitted to await acute encephalopathy.  Hospital course has been complicated by sepsis, abdominal pain, diarrhea stools and electrolyte abnormalities.   09/04/2021: Patient seen alongside patient's son and Nurse.  No significant history from patient.  Patient remains sleepy/lethargic.  Patient continues to have diarrhea stools but now nonbloody.  No fever is reported.  Left-sided lower abdominal pain persists.  Remains on IV Zosyn.   09/05/2021: Patient seen alongside patient's son and nurse.  Patient looks better today.  Patient continues to have diarrhea.  GI panel is pending.  So far, no C. difficile.  She has history of giardiasis.  There may be antibiotics related.  Electrolyte abnormalities noted, sodium of 128, potassium of 3.1, phosphorus of 2.1 and magnesium of 1.7.     09/06/2021: Gaseous distention of the abdomen is noted.  Bladder is distended.  Bladder scan ordered stat.  Insert Foley catheter if there is confirmed retention.  Abdominal x-ray.  Continue to monitor and correct electrolytes.  Son updated.   09/07/2021: Dr. Marthenia Rolling examined patient seen alongside patient's 2 sons.  He had extensive discussion with patient's 2 sons.  All questions answered.  Will consult palliative care, but not emergently.  Palliative care team will help family to define goals of care.  Physical therapy input is appreciated.  Likely DC to skilled nursing facility for short-term rehab.  X-ray revealed ileus.  Foley catheter in place for urinary retention.  Left lower abdominal tenderness  persists, but seems to have improved slightly.  Difficult decision to proceed with repeat CT scan of the abdomen and pelvis, considering patient has had 2 prior CT scan of the abdomen and pelvis.  Repeat KUB done yesterday showed no pneumoperitoneum but did show mild small bowel dilatation of small bowel loops and she is complaining of some left-sided flank pain.  Continues to have some diarrhea.  GI recommending repeating CT scan tomorrow to help guide treatment and Dr. 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  Diet is being gradually advanced back to a soft diet and GI planning on removing Flexi-Seal and starting Imodium 1 mg p.o. twice daily now trying to avoid pain medications.  Assessment and Plan: Acute metabolic encephalopathy, presents on admission  -Blood culture no growth so far, chest x-ray no acute findings, UA no sign of  infection ( urine culture + 10,000 enterococcus, >100,000 lactobacillus, not sure if contamination or colonization, current abx should cover both though) -Confusion likely due to diverticulitis, electrolyte abnormalities -Resolved, AAOx3 09/04/2021: Likely combined metabolic and toxic encephalopathy.  Continue treatment for acute diverticulitis.  Continue volume repletion.  Apparently, patient may be intravascularly depleted.  Patient could not receive IV albumin due to concerns for allergy.  Continue to monitor on the electrolytes and replete accordingly. 09/05/2021: Encephalopathy is slowly resolving.  Patient is more communicative today.  Patient is also more awake and alert today. 09/06/2021: Continues to improve. 09/07/2021: Continues to improve. -She appears to be at her baseline mental status improved significantly today;  grandson at bedside; patient refusing to go to SNF   Sepsis developed on 8/5 pm Abx broadened to zosyn Wbc remain stable, continue to have ab pain, appear weak,  kub no acute findings, will repeat CT ab/pel with oral and iv contrast, case discussed with GI -Ct ab/pel showed noncomplicated diverticulitis, atelectasis and small left pleural effusion Family reports patient appear to have generalized edema today, she started to wheeze some, her blood pressure has been stable, lactic acid normalized, will try low dose lasix, ( initially plan to give albumin with lasix but there is one case report regarding albumin cause anaphylaxis reaction in patient with alpha gal, so did not give albumin) Echocardiogram ordered  09/04/2021: Sepsis physiology has resolved significantly.  Continue to manage expectantly. 09/05/2021: Patient continues to have left-sided abdominal pain.  Repeat CT scan of the abdomen and pelvis done on 09/03/2020 revealed mild uncomplicated sigmoid diverticulitis. 09/06/2021: Sepsis physiology has resolved. -GI planning on repeating a CT scan of the abdomen pelvis in the morning and will also repeat a KUB   Acute diverticulitis CT obtained in the ED showed uncomplicated sigmoid diverticulitis initially, repeat ct on 8/6 ordered  Received cefepime and vancomycin in the ED, then on Keflex and Flagyl, abx changed to zosyn due to developing sepsis on 8/5. 09/04/2021: Continue antibiotics.  He is currently on IV Zosyn. 09/05/2021: See above documentation. 09/07/2021: Left lower quadrant abdominal tenderness persists, but with slight improvement.  Patient is currently on IV Zosyn which we will continue   Acute blood loss anemia -had BRBPR  -son reports patient has been having darker stool for hte past week at home ( recent steroids exposure) -she c/o LLQ pain, no epigastric pain currently --Started on Protonix twice a day  -Hold Eliquis -h/h down trending, GI consulted, will follow recommendation 09/07/2021: Hemoglobin is 8.2 g/dL yesterday and for some reason not repeated today so we will repeat in the morning   Hyponatremia: -Likely prerenal. -Suspect  ineffective circulatory arterial volume. -IV albumin 25 g twice daily x 48 hours -Continue to monitor sodium level -Check urine sodium, urine osmolality and serum osmolality. 09/05/2021: IV albumin was not given due to allergies.  Gentle hydration.  Aggressively nutrition, however, patient has poor appetite.   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  Sodium yesterday was 130 and today is 133 -Continue monitor and trend and repeat CMP in the a.m.   Hypokalemia -Potassium is now 4.2 -Continue to monitor replete. -Patient has ileus and CT scan will be repeated   Hypomagnesemia -Continue to monitor and replete. -Magnesium yesterday was 1.7 and not repeated today -Will give IV magnesium 2 g x 1 dose.  Hypophosphatemia: -Phosphorus t yesterday was 3.1 and today is 3.0 -Continue to monitor and replete.       HTN, developed sepsis, hold  Coreg,  hold HCTZ, resume when appropriate and likely can start slowly resuming   PAF, currently sinus rhythm few nonsustained V. Tach -coreg hold due to sepsis, low dose lopressor with holding parameters -Secondary hypercoagulable state from afib ,hold Eliquis in the setting of GI bleed -Keep K above 4, mag above 2 -Continue telemetry     Cervical pain (neck) -CT c spine:  No acute fracture, does has  Multilevel degenerative changes.- - prn norco ordered at family's request -resume gabapentin at low dose, titrate up if able to tolerate  -Reported lidocaine patch helped ,currently no neck pain in this position, reports neck pain is positional , no weakness in arms, no  numbness or tingling in arms -was scheduled to see [facility] on day of admission but end up in the hospital, Contacted [facility] at family 's request, [facility] recommended outpatient follow up.     FTT: Family reports patient at [facility] able to  ambulate independently , lives byherself, family comes in daily , she does not drive , no recent falls , became weak in the  last few days  Son reports patient has poor appetite and 15lbs weight loss in last 3-6 months Seen by PT eval who recommend SNF,  family agrees SNF placement and can discharge once medically stable to SNF however patient refuses going to SNF   Gaseous distention of the abdomen: -Abdominal x-ray to be repeated -Likely multifactorial, illness and electrolyte abnormalities. -Getting a KUB in the morning  Acute urinary retention -Insert Foley catheter.  1300 cc of urine drained. -Leave Foley catheter in situ. -Start Flomax 0.4 Mg p.o. once daily. -Follow-up with urology on discharge if acute urinary retention does not resolve after voiding trial soon   Hypoalbuminemia -Patient's albumin level has trended down and is now 2.0 -Continue monitor and trend and repeat CMP in a.m.  DVT prophylaxis: SCDs Start: 09/01/21 0029    Code Status: DNR Family Communication: Discussed with grandson at bedside  Disposition Plan:  Level of care: Progressive Status is: Inpatient Remains inpatient appropriate because: Needs further GI clearance prior to safe discharge disposition    Consultants:  Gastroenterology Palliative care medicine  Procedures:  CT scan as above  Antimicrobials:  Anti-infectives (From admission, onward)    Start     Dose/Rate Route Frequency Ordered Stop   09/03/21 1600  piperacillin-tazobactam (ZOSYN) IVPB 3.375 g        3.375 g 12.5 mL/hr over 240 Minutes Intravenous Every 8 hours 09/03/21 1003     09/03/21 1100  piperacillin-tazobactam (ZOSYN) IVPB 3.375 g        3.375 g 100 mL/hr over 30 Minutes Intravenous NOW 09/03/21 1000 09/03/21 1034   09/02/21 1830  piperacillin-tazobactam (ZOSYN) IVPB 3.375 g  Status:  Discontinued        3.375 g 12.5 mL/hr over 240 Minutes Intravenous Every 8 hours 09/02/21 1816 09/03/21 1002   09/01/21 0800  cephALEXin (KEFLEX) capsule 500 mg  Status:  Discontinued        500 mg Oral Every 12 hours 09/01/21 0028 09/02/21 1803   09/01/21  0800  metroNIDAZOLE (FLAGYL) IVPB 500 mg  Status:  Discontinued        500 mg 100 mL/hr over 60 Minutes Intravenous Every 12 hours 09/01/21 0028 09/02/21 1816   08/31/21 1745  vancomycin (VANCOREADY) IVPB 1500 mg/300 mL        1,500 mg 150 mL/hr over 120 Minutes Intravenous  Once 08/31/21 1726 08/31/21 2040   08/31/21 1730  ceFEPIme (MAXIPIME) 2 g in sodium chloride 0.9 % 100 mL IVPB        2 g 200 mL/hr over 30 Minutes Intravenous  Once 08/31/21 1726 08/31/21 1836        Subjective: Seen and examined at bedside she is resting in the bed.  Was awake and alert.  Direct specific complaints had no nausea or vomiting.  Just did not feel as well and felt extremely weak and tired.  Complaining of some abdominal discomfort.  No other concerns or complaints this time.  Objective: Vitals:   09/07/21 1339 09/07/21 2039 09/08/21 0434 09/08/21 1329  BP: 128/67 (!) 153/76 (!) 142/86 124/66  Pulse: 94 94 87 66  Resp: '18 18 18 18  '$ Temp: 98.5 F (36.9 C) 99 F (37.2 C) 98.2 F (36.8 C) 98.1 F (36.7 C)  TempSrc: Oral Oral Oral Oral  SpO2: 100% 99% 97% 99%  Weight:      Height:        Intake/Output Summary (Last 24 hours) at 09/08/2021 1929 Last data filed at 09/08/2021 1600 Gross per 24 hour  Intake 447.88 ml  Output 1450 ml  Net -1002.12 ml   Filed Weights   08/31/21 1458 09/06/21 0648  Weight: 67.1 kg 80.3 kg    Examination: Physical Exam:  Constitutional: WN/WD overweight Caucasian chronically ill-appearing elderly female in no acute distress Respiratory: Distended to auscultation bilaterally, no wheezing, rales, rhonchi or crackles. Normal respiratory effort and patient is not tachypenic. No accessory muscle use.  Unlabored breathing Cardiovascular: RRR, no murmurs / rubs / gallops. S1 and S2 auscultated.  Has some trace extremity edema Abdomen: Soft, tender to palpate and is distended.. Bowel sounds positive and hyperactive.  GU: Deferred.  Has a Flexi-Seal Foley catheter in  place Musculoskeletal: No clubbing / cyanosis of digits/nails. No joint deformity upper and lower extremities.  Skin: No rashes, lesions, ulcers limited skin evaluation. No induration; Warm and dry.  Neurologic: CN 2-12 grossly intact with no focal deficits. Romberg sign and cerebellar reflexes not assessed.  Psychiatric: Normal judgment and insight. Alert and oriented x 3. Normal mood and appropriate affect.   Data Reviewed: I have personally reviewed following labs and imaging studies  CBC: Recent Labs  Lab 09/02/21 0447 09/02/21 1841 09/03/21 0247 09/04/21 0234 09/07/21 0444  WBC 8.1 8.1 8.2 9.7 8.6  NEUTROABS 6.9  --   --  7.5 6.2  HGB 9.5* 9.8* 8.8* 9.5* 8.2*  HCT 27.8* 29.1* 26.7* 28.9* 24.8*  MCV 93.6 94.2 95.4 94.1 94.3  PLT 184 220 184 193 443   Basic Metabolic Panel: Recent Labs  Lab 09/02/21 0447 09/03/21 0247 09/04/21 0234 09/05/21 0223 09/06/21 0252 09/07/21 0444 09/08/21 0547  NA 129* 130* 127* 128* 129* 130* 133*  K 2.9* 3.3* 3.6 3.1* 3.9 3.7 4.2  CL 96* 101 96* 95* 98 100 102  CO2 25 21* '22 23 22 '$ 21* 24  GLUCOSE 148* 104* 113* 99 93 86 92  BUN '14 15 14 14 15 12 12  '$ CREATININE 0.56 0.75 0.96 0.73 0.83 0.78 0.74  CALCIUM 7.9* 7.6* 7.9* 7.8* 7.6* 7.7* 7.8*  MG 1.8 1.6* 2.0 1.7  --  1.7  --   PHOS  --   --   --  2.1* 2.4* 3.1 3.0   GFR: Estimated Creatinine Clearance: 53.8 mL/min (by C-G formula based on SCr of 0.74 mg/dL). Liver Function Tests: Recent Labs  Lab 09/02/21 0447 09/05/21 0223 09/06/21 0252 09/07/21 0444 09/08/21 0547  AST 22  --   --   --   --   ALT 31  --   --   --   --   ALKPHOS 58  --   --   --   --   BILITOT 0.9  --   --   --   --   PROT 5.4*  --   --   --   --   ALBUMIN 2.2* 2.0* 2.1* 1.8* 2.0*   No results for input(s): "LIPASE", "AMYLASE" in the last 168 hours. No results for input(s): "AMMONIA" in the last 168 hours. Coagulation Profile: No results for input(s): "INR", "PROTIME" in the last 168  hours. Cardiac  Enzymes: No results for input(s): "CKTOTAL", "CKMB", "CKMBINDEX", "TROPONINI" in the last 168 hours. BNP (last 3 results) No results for input(s): "PROBNP" in the last 8760 hours. HbA1C: No results for input(s): "HGBA1C" in the last 72 hours. CBG: No results for input(s): "GLUCAP" in the last 168 hours. Lipid Profile: No results for input(s): "CHOL", "HDL", "LDLCALC", "TRIG", "CHOLHDL", "LDLDIRECT" in the last 72 hours. Thyroid Function Tests: No results for input(s): "TSH", "T4TOTAL", "FREET4", "T3FREE", "THYROIDAB" in the last 72 hours. Anemia Panel: No results for input(s): "VITAMINB12", "FOLATE", "FERRITIN", "TIBC", "IRON", "RETICCTPCT" in the last 72 hours. Sepsis Labs: Recent Labs  Lab 09/02/21 1841 09/02/21 2212  LATICACIDVEN 3.2* 1.5    Recent Results (from the past 240 hour(s))  Blood Culture (routine x 2)     Status: None   Collection Time: 08/31/21  4:03 PM   Specimen: BLOOD  Result Value Ref Range Status   Specimen Description   Final    BLOOD LEFT ANTECUBITAL Performed at [facility] [address] [place], [place] [address]    Special Requests   Final    BOTTLES DRAWN AEROBIC AND ANAEROBIC Blood Culture adequate volume Performed at [facility] [address], [place], [place] [address]    Culture   Final    NO GROWTH 5 DAYS Performed at [facility], [place] [address], [place], [place] [address]    Report Status 09/05/2021 FINAL  Final  Blood Culture (routine x 2)     Status: None   Collection Time: 08/31/21  4:30 PM   Specimen: BLOOD  Result Value Ref Range Status   Specimen Description   Final    BLOOD RIGHT ANTECUBITAL Performed at [facility] [address], [place], [place] [address]    Special Requests   Final    BOTTLES DRAWN AEROBIC ONLY Blood Culture adequate volume Performed at [facility] [address], [place], [place] [address]    Culture   Final     NO GROWTH 5 DAYS Performed at [facility], [place] [address], [place], [place] [address]    Report Status 09/05/2021 FINAL  Final  Urine Culture     Status: Abnormal   Collection Time: 08/31/21  6:28 PM   Specimen: In/Out Cath Urine  Result Value Ref Range Status   Specimen Description   Final    IN/OUT CATH URINE Performed at [facility] [address] [place], [place] [address]    Special Requests   Final    NONE Performed at [facility], [place] [address] [place],  [address]    Culture (A)  Final    10,000 COLONIES/mL ENTEROCOCCUS FAECALIS >=100,000 COLONIES/mL LACTOBACILLUS SPECIES Standardized susceptibility testing for this organism is not available. Performed at [facility], [place] [address] [place],  [address]    Report Status 09/03/2021 FINAL  Final   Organism ID, Bacteria ENTEROCOCCUS FAECALIS (A)  Final      Susceptibility   Enterococcus faecalis - MIC*    AMPICILLIN <=2 SENSITIVE Sensitive     NITROFURANTOIN <=16 SENSITIVE Sensitive     VANCOMYCIN 1 SENSITIVE Sensitive     * 10,000 COLONIES/mL ENTEROCOCCUS FAECALIS  Culture, blood (Routine X 2) w Reflex to ID Panel     Status: None   Collection Time: 09/02/21  6:47 PM   Specimen: BLOOD  Result Value Ref Range Status   Specimen Description  Final    BLOOD RIGHT ANTECUBITAL Performed at [facility] [address], [place], [place] [address]    Special Requests   Final    BOTTLES DRAWN AEROBIC AND ANAEROBIC Blood Culture adequate volume Performed at [facility] [address] [place], [place] [address]    Culture   Final    NO GROWTH 5 DAYS Performed at [facility], [place] [address] [place], [place] [address]    Report Status 09/07/2021 FINAL  Final  Culture, blood (Routine X 2) w Reflex to ID Panel     Status: None   Collection Time: 09/02/21  6:50 PM   Specimen: BLOOD  Result Value Ref Range  Status   Specimen Description   Final    BLOOD BLOOD LEFT HAND Performed at [facility] [address], [place], [place] [address]    Special Requests   Final    BOTTLES DRAWN AEROBIC AND ANAEROBIC Blood Culture adequate volume Performed at [facility] [address], [place], [place] [address]    Culture   Final    NO GROWTH 5 DAYS Performed at [facility], [place] [address], [place], [place] [address]    Report Status 09/07/2021 FINAL  Final  MRSA Next Gen by PCR, Nasal     Status: None   Collection Time: 09/02/21  8:17 PM   Specimen: Nasal Mucosa; Nasal Swab  Result Value Ref Range Status   MRSA by PCR Next Gen NOT DETECTED NOT DETECTED Final    Comment: (NOTE) The GeneXpert MRSA Assay (FDA approved for NASAL specimens only), is one component of a comprehensive MRSA colonization surveillance program. It is not intended to diagnose MRSA infection nor to guide or monitor treatment for MRSA infections. Test performance is not FDA approved in patients less than 28 years old. Performed at [facility], [place] [address], [place], [place] [address]   C Difficile Quick Screen (NO PCR Reflex)     Status: None   Collection Time: 09/05/21  7:58 AM   Specimen: STOOL  Result Value Ref Range Status   C Diff antigen NEGATIVE NEGATIVE Final   C Diff toxin NEGATIVE NEGATIVE Final   C Diff interpretation No C. difficile detected.  Final    Comment: Performed at [facility], [place] [address] [place],  [address]  Gastrointestinal Panel by PCR , Stool     Status: None   Collection Time: 09/05/21  7:58 AM   Specimen: Stool  Result Value Ref Range Status   Campylobacter species NOT DETECTED NOT DETECTED Final   Plesimonas shigelloides NOT DETECTED NOT DETECTED Final   Salmonella species NOT DETECTED NOT DETECTED Final   Yersinia enterocolitica NOT DETECTED NOT DETECTED Final   Vibrio species NOT DETECTED  NOT DETECTED Final   Vibrio cholerae NOT DETECTED NOT DETECTED Final   Enteroaggregative E coli (EAEC) NOT DETECTED NOT DETECTED Final   Enteropathogenic E coli (EPEC) NOT DETECTED NOT DETECTED Final   Enterotoxigenic E coli (ETEC) NOT DETECTED NOT DETECTED Final   Shiga like toxin producing E coli (STEC) NOT DETECTED NOT DETECTED Final   Shigella/Enteroinvasive E coli (EIEC) NOT DETECTED NOT DETECTED Final   Cryptosporidium NOT DETECTED NOT DETECTED Final   Cyclospora cayetanensis NOT DETECTED NOT DETECTED Final   Entamoeba histolytica NOT DETECTED NOT DETECTED Final   Giardia lamblia NOT DETECTED NOT DETECTED Final   Adenovirus F40/41 NOT DETECTED NOT DETECTED Final  Astrovirus NOT DETECTED NOT DETECTED Final   Norovirus GI/GII NOT DETECTED NOT DETECTED Final   Rotavirus A NOT DETECTED NOT DETECTED Final   Sapovirus (I, II, IV, and V) NOT DETECTED NOT DETECTED Final    Comment: Performed at [facility], [address] [place], [place] [address]     Radiology Studies: DG Abd 1 View  Result Date: 09/08/2021 CLINICAL DATA:  Abdominal distension EXAM: ABDOMEN - 1 VIEW COMPARISON:  September 07, 2021. FINDINGS: The bowel gas pattern is normal. No radio-opaque calculi or other significant radiographic abnormality are seen. IMPRESSION: Negative. Electronically Signed   By: Dorise Bullion III M.D.   On: 09/08/2021 13:05   DG Abd 2 Views  Result Date: 09/07/2021 CLINICAL DATA:  Abdominal pain EXAM: ABDOMEN - 2 VIEW COMPARISON:  09/06/2021 FINDINGS: There is no pneumoperitoneum. There is mild dilation of few small-bowel loops in abdomen. There is no significant distention of colon. Surgical clips are seen in gallbladder fossa. No abnormal masses or calcifications are seen. Kidneys are partly obscured by bowel contents. Phleboliths are seen in pelvis. Increased density is seen in medial left lower lung field. This may suggest subsegmental atelectasis/pneumonia. IMPRESSION: There is no  significant small-bowel dilation. Presence of gas in small bowel loops and colon may suggest ileus. There is no pneumoperitoneum. No abnormal masses or calcifications are seen. Electronically Signed   By: Elmer Picker M.D.   On: 09/07/2021 16:56     Scheduled Meds:  Chlorhexidine Gluconate Cloth  6 each Topical Daily   feeding supplement  1 Container Oral TID BM   feeding supplement (KATE FARMS STANDARD 1.4)  325 mL Oral BID BM   gabapentin  100 mg Oral BID   lidocaine  1 patch Transdermal Q24H   loperamide  2 mg Oral BID   melatonin  3 mg Oral QHS   metoprolol tartrate  12.5 mg Oral BID   [START ON 09/09/2021] pantoprazole  40 mg Oral Q0600   tamsulosin  0.4 mg Oral Daily   Continuous Infusions:  0.9 % NaCl with KCl 20 mEq / L 50 mL/hr at 09/08/21 1544   piperacillin-tazobactam (ZOSYN)  IV 3.375 g (09/08/21 1558)     LOS: 7 days   Raiford Noble, DO [facility] Available via Epic secure chat 7am-7pm After these hours, please refer to coverage provider listed on [email or web address] 09/08/2021, 7:29 PM

## 2021-09-08 NOTE — Progress Notes (Addendum)
Patient ID: Caitlin Chavez, female   DOB: 09/22/1934, 87 y.o.   MRN: [id number]    Progress Note   Subjective   Day # 8 CC acute diverticultis   IV Zosyn  Flexi-Seal in place-liquid stool   small streaks of blood Foley catheter    KUB 8/10-no pneumoperitoneum mild dilation of a few small bowel loops significant distention of colon.  Patient says her abdomen is not hurting unless it is pressed on, she was able to take some liquids, no nausea or vomiting says she is not really hungry. Complaining of lower abdominal pain with the abdomen and also complaining of rectal pain which she believes is from the tube irritating her rectum      Objective   Vital signs in last 24 hours: Temp:  [98.2 F (36.8 C)-99 F (37.2 C)] 98.2 F (36.8 C) (08/11 0434) Pulse Rate:  [87-94] 87 (08/11 0434) Resp:  [18] 18 (08/11 0434) BP: (128-153)/(67-86) 142/86 (08/11 0434) SpO2:  [97 %-100 %] 97 % (08/11 0434) Last BM Date : 09/07/21 General: Elderly white female in NAD Heart:  Regular rate and rhythm; no murmurs Lungs: Respirations even and unlabored, lungs CTA bilaterally Abdomen:  Soft, she still has some tenderness in the prepubic and left lower quadrant area no guarding or rebound , abdomen is softer today, bowel sounds still somewhat high-pitched Neurologic:  Alert and oriented,  grossly normal neurologically. Psych:  Cooperative. Normal mood and affect.  Intake/Output from previous day: 08/10 0701 - 08/11 0700 In: 1313.7 [P.O.:360; I.V.:930.4; IV Piggyback:23.3] Out: 1450 [Urine:1200; Stool:250] Intake/Output this shift: No intake/output data recorded.  Lab Results: Recent Labs    09/07/21 0444  WBC 8.6  HGB 8.2*  HCT 24.8*  PLT 230   BMET Recent Labs    09/06/21 0252 09/07/21 0444 09/08/21 0547  NA 129* 130* 133*  K 3.9 3.7 4.2  CL 98 100 102  CO2 22 21* 24  GLUCOSE 93 86 92  BUN '15 12 12  '$ CREATININE 0.83 0.78 0.74  CALCIUM 7.6* 7.7* 7.8*   LFT Recent Labs     09/08/21 0547  ALBUMIN 2.0*   PT/INR No results for input(s): "LABPROT", "INR" in the last 72 hours.  Studies/Results: DG Abd 2 Views  Result Date: 09/07/2021 CLINICAL DATA:  Abdominal pain EXAM: ABDOMEN - 2 VIEW COMPARISON:  09/06/2021 FINDINGS: There is no pneumoperitoneum. There is mild dilation of few small-bowel loops in abdomen. There is no significant distention of colon. Surgical clips are seen in gallbladder fossa. No abnormal masses or calcifications are seen. Kidneys are partly obscured by bowel contents. Phleboliths are seen in pelvis. Increased density is seen in medial left lower lung field. This may suggest subsegmental atelectasis/pneumonia. IMPRESSION: There is no significant small-bowel dilation. Presence of gas in small bowel loops and colon may suggest ileus. There is no pneumoperitoneum. No abnormal masses or calcifications are seen. Electronically Signed   By: Elmer Picker M.D.   On: 09/07/2021 16:56   DG Abd 1 View  Result Date: 09/06/2021 CLINICAL DATA:  Distension of the abdomen. EXAM: ABDOMEN - 1 VIEW COMPARISON:  September 03, 2021 FINDINGS: The bowel gas pattern is normal. No radio-opaque calculi or other significant radiographic abnormality are seen. Nonspecific sub pathologic distension of the colon. IMPRESSION: 1. Nonobstructive bowel gas pattern. 2. Nonspecific sub pathologic gaseous distension of the colon. Electronically Signed   By: Fidela Salisbury M.D.   On: 09/06/2021 16:55       Assessment / Plan:    #  75 87 year old white female with acute sigmoid diverticulitis, altered mental status on admission which has since resolved  She has been on IV antibiotics for 1 week Had continued complaints of lower abdominal pain-high postvoid residual so Foley was placed 2 days ago but no significant improvement in her complaints of pain She has had diarrhea since admission, precipitating placement of Flexi-Seal on 09/04/2021 C. difficile PCR and GI path panel here  both negative on 09/05/2021 Diarrhea felt antibiotic related  Mild possible small bowel ileus on KUB yesterday, abdomen improved today.  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 This was not clearly present on a CT from 10/28/2018, and not present on the CT from 11/21/2015. Is no other pancreatic abnormality. No inflammation.    #2 metabolic encephalopathy-resolved #3 anemia chronic-patient has had some very low-grade bleeding since admission  #4 history of atrial fibrillation-was on hold #5 cervical spine pain #6  failure to thrive  Plan; will gradually advance diet back to a soft diet Continue supplements Would like to get the Flexi-Seal out-will start Imodium 1 p.o. twice daily Continue to try to avoid pain medications Will discuss with Dr. Carlean Purl, regarding follow-up CT scan, versus discontinuing IV Zosyn at this point and converting to oral antibiotics to complete a 10 to 14-day course for diverticulitis    Principal Problem:   Acute metabolic encephalopathy Active Problems:   Generalized weakness   Essential hypertension   Chronic a-fib (HCC)   Sigmoid diverticulitis   Chronic anticoagulation - on eliquis for afib   Cervical pain (neck)   Hypokalemia   GI bleed     LOS: 7 days   Caitlin Esterwood  PA-C8/11/2021, 8:42 AM      [facility] Attending   I have taken an interval history, reviewed the chart and examined the patient. I agree with the Advanced Practitioner's note, impression and recommendations.    1) Repeat CT tomorrow - clinical exam remains with sig abd tenderness so need CT to help guide treatment  2) I told her about the pancreatic mass - will need to clue family in - this is unlikely to ever be a clinical issue  for her, I suspect  Gatha Mayer, MD, [facility] See Shea Evans on call - gastroenterology for best contact person 09/08/2021 5:07 PM   8:12 PM Spoke to son Legrand Como re: pancreas lesion - mass Explained it was unlikely this is playing a role in her current signs and symptoms.  CT again tomorrow and will regroup. She may need a sigmoidoscopy to better understand this proces, consider [facility] input also (evaluation, not surgery)  Gatha Mayer, MD, [facility]

## 2021-09-09 ENCOUNTER — Encounter (HOSPITAL_COMMUNITY): Payer: Self-pay | Admitting: Internal Medicine

## 2021-09-09 ENCOUNTER — Inpatient Hospital Stay (HOSPITAL_COMMUNITY): Payer: Medicare Other

## 2021-09-09 DIAGNOSIS — I739 Peripheral vascular disease, unspecified: Secondary | ICD-10-CM | POA: Diagnosis not present

## 2021-09-09 DIAGNOSIS — K5732 Diverticulitis of large intestine without perforation or abscess without bleeding: Secondary | ICD-10-CM | POA: Diagnosis not present

## 2021-09-09 DIAGNOSIS — I482 Chronic atrial fibrillation, unspecified: Secondary | ICD-10-CM | POA: Diagnosis not present

## 2021-09-09 DIAGNOSIS — M542 Cervicalgia: Secondary | ICD-10-CM | POA: Diagnosis not present

## 2021-09-09 DIAGNOSIS — K8689 Other specified diseases of pancreas: Secondary | ICD-10-CM | POA: Diagnosis not present

## 2021-09-09 LAB — CBC WITH DIFFERENTIAL/PLATELET
Abs Immature Granulocytes: 0.74 10*3/uL — ABNORMAL HIGH (ref 0.00–0.07)
Basophils Absolute: 0.1 10*3/uL (ref 0.0–0.1)
Basophils Relative: 1 %
Eosinophils Absolute: 0.1 10*3/uL (ref 0.0–0.5)
Eosinophils Relative: 2 %
HCT: 26.1 % — ABNORMAL LOW (ref 36.0–46.0)
Hemoglobin: 8.5 g/dL — ABNORMAL LOW (ref 12.0–15.0)
Immature Granulocytes: 11 %
Lymphocytes Relative: 18 %
Lymphs Abs: 1.2 10*3/uL (ref 0.7–4.0)
MCH: 30.9 pg (ref 26.0–34.0)
MCHC: 32.6 g/dL (ref 30.0–36.0)
MCV: 94.9 fL (ref 80.0–100.0)
Monocytes Absolute: 0.5 10*3/uL (ref 0.1–1.0)
Monocytes Relative: 7 %
Neutro Abs: 4.3 10*3/uL (ref 1.7–7.7)
Neutrophils Relative %: 61 %
Platelets: 267 10*3/uL (ref 150–400)
RBC: 2.75 MIL/uL — ABNORMAL LOW (ref 3.87–5.11)
RDW: 14.3 % (ref 11.5–15.5)
WBC: 7 10*3/uL (ref 4.0–10.5)
nRBC: 0.4 % — ABNORMAL HIGH (ref 0.0–0.2)

## 2021-09-09 LAB — MAGNESIUM: Magnesium: 2 mg/dL (ref 1.7–2.4)

## 2021-09-09 LAB — COMPREHENSIVE METABOLIC PANEL
ALT: 14 U/L (ref 0–44)
AST: 12 U/L — ABNORMAL LOW (ref 15–41)
Albumin: 1.8 g/dL — ABNORMAL LOW (ref 3.5–5.0)
Alkaline Phosphatase: 50 U/L (ref 38–126)
Anion gap: 7 (ref 5–15)
BUN: 11 mg/dL (ref 8–23)
CO2: 25 mmol/L (ref 22–32)
Calcium: 8 mg/dL — ABNORMAL LOW (ref 8.9–10.3)
Chloride: 102 mmol/L (ref 98–111)
Creatinine, Ser: 0.77 mg/dL (ref 0.44–1.00)
GFR, Estimated: >60 mL/min (ref >60)
Glucose, Bld: 89 mg/dL (ref 70–99)
Potassium: 4.2 mmol/L (ref 3.5–5.1)
Sodium: 134 mmol/L — ABNORMAL LOW (ref 135–145)
Total Bilirubin: 0.7 mg/dL (ref 0.3–1.2)
Total Protein: 5.1 g/dL — ABNORMAL LOW (ref 6.5–8.1)

## 2021-09-09 LAB — PHOSPHORUS: Phosphorus: 3.4 mg/dL (ref 2.5–4.6)

## 2021-09-09 MED ORDER — IOHEXOL 9 MG/ML PO SOLN: 500.0000 mL | ORAL | Status: AC

## 2021-09-09 MED ORDER — IOHEXOL 300 MG/ML  SOLN
100.0000 mL | Freq: Once | INTRAMUSCULAR | Status: AC | PRN
Start: 2021-09-09 — End: 2021-09-09
  Administered 2021-09-09: 100 mL via INTRAVENOUS

## 2021-09-09 MED ORDER — SODIUM CHLORIDE (PF) 0.9 % IJ SOLN
INTRAMUSCULAR | Status: AC
  Filled 2021-09-09: qty 50

## 2021-09-09 MED ORDER — IOHEXOL 9 MG/ML PO SOLN
500.0000 mL | ORAL | Status: AC
  Administered 2021-09-09 (×2): 500 mL via ORAL

## 2021-09-09 NOTE — Progress Notes (Signed)
RN spoke with Dr. Nita Sickle in regards to pt and familys concerns about some findings on scans and notes made in the chart at 2003. Dr. Nita Sickle informed RN that he will contact Ronalee Belts and come see the pt in the morning and discuss the plan and any results/findings with the pt during morning rounds.

## 2021-09-09 NOTE — Progress Notes (Signed)
   Patient Name: Caitlin Chavez Date of Encounter: 09/09/2021, 11:01 AM    Subjective  Now says she is having pain at rest that used to be just with abdominal exam, palpation.  CT scan scheduled for later today she has finished contrast.     Objective  BP (!) 161/74 (BP Location: Right Arm)   Pulse 89   Temp 98 F (36.7 C) (Oral)   Resp 20   Ht '5\' 5"'$  (1.651 m)   Wt 80.3 kg   SpO2 99%   BMI 29.46 kg/m  Elderly white woman lying in bed no acute distress Abdomen is protuberant and obese and tender in the right and left lower quadrants though seems less tender to me today-she has had pain medication    Assessment and Plan  #1 acute sigmoid diverticulitis on antibiotics for 8 days without improvement question worsening-Zosyn since 8/6, 8/3-8/5 was on cefepime and metronidazole  #2 abdominal pain thought to be associated with above, had Foley catheter placed due to high postvoid residual, had a transient ileus, pain same or worse since  #3 diarrhea negative infectious work-up, Flexi-Seal in place since 8/7, Imodium started yesterday.  #4 cystic mass of the pancreas 2.3 cm maximum, seen on 2 CT scans here are new since 2020 we think  #5 rectal bleeding initially in hospital course, this has stopped suspected to be from hemorrhoids,  #6 metabolic encephalopathy requiring transfer to ICU-resolved  #7 failure to thrive  #8 atrial fibrillation on Eliquis, currently held    --------------------------------------------------------------------  Patient is not responding to therapy.  Perhaps worse based upon pain without palpation now.  Repeating CT scan today and determine next steps.  Consider having surgery evaluate her for other clinical input though she is not a surgical candidate might benefit from another physician input.  Question if she would need a flex sig to assess CT findings in the sigmoid colon thought to be diverticulitis.  Once again reviewed the cystic mass of  the pancreas and how that is unlikely to be related to her current problems and would not be resectable and would be unlikely to work that up given age and comorbidities etc.  I will check a CA 19-9.   Gatha Mayer, MD, [facility] See Shea Evans on call - gastroenterology for best contact person 09/09/2021 11:01 AM

## 2021-09-09 NOTE — Progress Notes (Signed)
PROGRESS NOTE    Caitlin Chavez  WEX:[id number] DOB: 1935-01-09 DOA: 08/31/2021 PCP: Sueanne Margarita, DO   Brief Narrative:  Patient is an 87 year old female with history of chronic A-fib on Eliquis, hypertension, chronic UTIs presents the ER today with confusion and weakness.  Patient has recent history of UTI secondary to E. coli that was treated with multiple antibiotics, as well as, steroid treatment for neck pain.  Patient was admitted to await acute encephalopathy.  Hospital course has been complicated by sepsis, abdominal pain, diarrhea stools and electrolyte abnormalities.   09/04/2021: Patient seen alongside patient's son and Nurse.  No significant history from patient.  Patient remains sleepy/lethargic.  Patient continues to have diarrhea stools but now nonbloody.  No fever is reported.  Left-sided lower abdominal pain persists.  Remains on IV Zosyn.   09/05/2021: Patient seen alongside patient's son and nurse.  Patient looks better today.  Patient continues to have diarrhea.  GI panel is pending.  So far, no C. difficile.  She has history of giardiasis.  There may be antibiotics related.  Electrolyte abnormalities noted, sodium of 128, potassium of 3.1, phosphorus of 2.1 and magnesium of 1.7.     09/06/2021: Gaseous distention of the abdomen is noted.  Bladder is distended.  Bladder scan ordered stat.  Insert Foley catheter if there is confirmed retention.  Abdominal x-ray.  Continue to monitor and correct electrolytes.  Son updated.   09/07/2021: Dr. Marthenia Rolling examined patient seen alongside patient's 2 sons.  He had extensive discussion with patient's 2 sons.  All questions answered.  Will consult palliative care, but not emergently.  Palliative care team will help family to define goals of care.  Physical therapy input is appreciated.  Likely DC to skilled nursing facility for short-term rehab.  X-ray revealed ileus.  Foley catheter in place for urinary retention.  Left lower abdominal tenderness  persists, but seems to have improved slightly.  Difficult decision to proceed with repeat CT scan of the abdomen and pelvis, considering patient has had 2 prior CT scan of the abdomen and pelvis.   Repeat KUB done yesterday showed no pneumoperitoneum but did show mild small bowel dilatation of small bowel loops and she is complaining of some left-sided flank pain.  Continues to have some diarrhea.  GI recommending repeating CT scan tomorrow to help guide treatment and Dr. 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  Diet is being gradually advanced back to a soft diet and GI planning on removing Flexi-Seal and starting Imodium 1 mg p.o. twice daily now trying to avoid pain medications.   09/09/2021 today she underwent a repeat CT scan of the abdomen pelvis due to her continued abdominal pain which is worsening.  Repeat CT scan showed mild diverticulitis and a small cystic 2.1 cm lesion in the pancreatic head was recommended continuing MRI with and without contrast.  She had a stable large hiatal hernia as well as a small periumbilical ventral hernia only containing fat.  Her left leg was a lot cooler than her right leg and so we will obtain ABIs despite her having a decent pulse.  To evaluate.  Coolness could be related to her peripheral neuropathy.  Assessment and Plan: Acute metabolic encephalopathy, presents on admission  -Blood culture no growth so far, chest x-ray no acute findings, UA no sign of  infection ( urine culture + 10,000 enterococcus, >100,000 lactobacillus, not sure  if contamination or colonization, current abx should cover both though) -Confusion likely due to diverticulitis, electrolyte abnormalities -Resolved, AAOx3 09/04/2021: Likely combined metabolic and toxic encephalopathy.  Continue treatment for acute diverticulitis.  Continue volume repletion.  Apparently, patient may be intravascularly  depleted.  Patient could not receive IV albumin due to concerns for allergy.  Continue to monitor on the electrolytes and replete accordingly. 09/05/2021: Encephalopathy is slowly resolving.  Patient is more communicative today.  Patient is also more awake and alert today. 09/06/2021: Continues to improve. 09/07/2021: Continues to improve. -She appears to be at her baseline mental status improved significantly today; grandson at bedside; patient refusing to go to SNF   Sepsis developed on 8/5 pm Abx broadened to zosyn Wbc remain stable, continue to have ab pain, appear weak, kub no acute findings, will repeat CT ab/pel with oral and iv contrast, case discussed with GI -Ct ab/pel showed noncomplicated diverticulitis, atelectasis and small left pleural effusion Family reports patient appear to have generalized edema today, she started to wheeze some, her blood pressure has been stable, lactic acid normalized, will try low dose lasix, ( initially plan to give albumin with lasix but there is one case report regarding albumin cause anaphylaxis reaction in patient with alpha gal, so did not give albumin) Echocardiogram ordered  09/04/2021: Sepsis physiology has resolved significantly.  Continue to manage expectantly. 09/05/2021: Patient continues to have left-sided abdominal pain.  Repeat CT scan of the abdomen and pelvis done on 09/03/2020 revealed mild uncomplicated sigmoid diverticulitis. 09/06/2021: Sepsis physiology has resolved. -GI planning repeated a CT scan as below and obtained a CA 19-9   Acute diverticulitis CT obtained in the ED showed uncomplicated sigmoid diverticulitis initially, repeat ct on 8/6 ordered  Received cefepime and vancomycin in the ED, then on Keflex and Flagyl, abx changed to zosyn due to developing sepsis on 8/5. 09/04/2021: Continue antibiotics.  He is currently on IV Zosyn. 09/05/2021: See above documentation. 09/07/2021: Left lower quadrant abdominal tenderness persists, but with  slight improvement.  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 -CA 19-9 being checked  Left Leg Coolness -Could be related to Neuropathy -Check Vascular ABI  Acute blood loss anemia -had BRBPR  -son reports patient has been having darker stool for hte past week at home ( recent steroids exposure) -she c/o LLQ pain, no epigastric pain currently --Started on Protonix twice a day  -Hold Eliquis in case she needs to go for sigmoidoscopy -h/h down trending, GI consulted, will follow recommendation Hemoglobin/hematocrit is relatively stable now at 8.5/26.1   Hyponatremia: -Likely prerenal. -Suspect ineffective circulatory arterial volume. -IV albumin 25 g twice daily x 48 hours -Continue to monitor sodium level -Check urine sodium, urine osmolality and serum osmolality. 09/05/2021: IV albumin was not given due to allergies.  Gentle hydration.  Aggressively nutrition, however, patient has poor appetite.   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  Sodium is 134 -Continue monitor and trend and repeat CMP in the a.m.   Hypokalemia -Potassium is now 4.2 x2  -Continue to monitor replete. -Patient has ileus and CT scan will be repeated   Hypomagnesemia -Continue to monitor and replete. -Magnesium is now 2.0 -Repeat CMP in a.m.  Hypophosphatemia: -Phosphorus is now  3.4 -Continue to monitor and replete.     HTN, developed sepsis, hold  Coreg,  hold HCTZ, resume when appropriate and likely can start slowly resuming   PAF, currently sinus rhythm few nonsustained V. Tach -coreg hold due to sepsis, low dose lopressor with holding parameters -Secondary hypercoagulable state from afib ,hold Eliquis in the setting of GI bleed and may need to resume soon but if she undergoes a sigmoidoscopy will need to continue to hold -Keep K above 4, mag above 2 -Continue telemetry     Cervical pain (neck) -CT c spine:  No acute fracture, does has  Multilevel degenerative changes.- - prn norco ordered at family's request -resume gabapentin at low dose, titrate up if able to tolerate  -Reported lidocaine patch helped ,currently no neck pain in this position, reports neck pain is positional , no weakness in arms, no numbness or tingling in arms -was scheduled to see [facility] on day of admission but end up in the hospital, Contacted [facility] at family 's request, [facility] recommended outpatient follow up.     FTT: Family reports patient at [facility] able to  ambulate independently , lives byherself, family comes in daily , she does not drive , no recent falls , became weak in the last few days  Son reports patient has poor appetite and 15lbs weight loss in last 3-6 months Seen by PT eval who recommend SNF,  family agrees SNF placement and can discharge once medically stable to SNF however patient refuses going to SNF   Gaseous distention of the abdomen: -Abdominal x-ray to be repeated -Likely multifactorial, illness and electrolyte abnormalities. -Repeat CT scan as above  Acute urinary retention -Insert Foley catheter.  1300 cc of urine drained. -Leave Foley catheter in situ. -Start Flomax 0.4 Mg p.o. once daily. -Follow-up with urology on discharge if acute urinary retention does not resolve after voiding trial soon   Hypoalbuminemia -Patient's albumin  level has trended down and is now 2.0 yesterday is now 1.8 -Continue monitor and trend and repeat CMP in a.m.   DVT prophylaxis: SCDs Start: 09/01/21 0029    Code Status: DNR Family Communication: Discussed with the son at bedside  Disposition Plan:  Level of care: Progressive Status is: Inpatient Remains inpatient appropriate because: Needs further work-up and clearance by GI   Consultants:  Gastroenterology Palliative care medicine We will need to discuss with surgery based on GI recommendations  Procedures:  As above CT scans  Antimicrobials:  Anti-infectives (From admission, onward)    Start     Dose/Rate Route Frequency Ordered Stop   09/03/21 1600  piperacillin-tazobactam (ZOSYN) IVPB 3.375 g        3.375 g 12.5 mL/hr over 240 Minutes Intravenous Every 8 hours 09/03/21 1003     09/03/21 1100  piperacillin-tazobactam (ZOSYN) IVPB 3.375 g        3.375 g 100 mL/hr over 30 Minutes Intravenous NOW 09/03/21 1000 09/03/21 1034   09/02/21 1830  piperacillin-tazobactam (ZOSYN) IVPB 3.375 g  Status:  Discontinued        3.375 g 12.5 mL/hr over 240 Minutes Intravenous  Every 8 hours 09/02/21 1816 09/03/21 1002   09/01/21 0800  cephALEXin (KEFLEX) capsule 500 mg  Status:  Discontinued        500 mg Oral Every 12 hours 09/01/21 0028 09/02/21 1803   09/01/21 0800  metroNIDAZOLE (FLAGYL) IVPB 500 mg  Status:  Discontinued        500 mg 100 mL/hr over 60 Minutes Intravenous Every 12 hours 09/01/21 0028 09/02/21 1816   08/31/21 1745  vancomycin (VANCOREADY) IVPB 1500 mg/300 mL        1,500 mg 150 mL/hr over 120 Minutes Intravenous  Once 08/31/21 1726 08/31/21 2040   08/31/21 1730  ceFEPIme (MAXIPIME) 2 g in sodium chloride 0.9 % 100 mL IVPB        2 g 200 mL/hr over 30 Minutes Intravenous  Once 08/31/21 1726 08/31/21 1836        Subjective: Seen and examined at bedside states that she was not feeling as good and was complaining of some abdominal discomfort.  No nausea or  vomiting.  Left leg was a lot cooler than her right.  Denies any chest pain or shortness of breath.  No other concerns or complaints at this time.  Objective: Vitals:   09/08/21 1329 09/08/21 2054 09/09/21 0509 09/09/21 1352  BP: 124/66 (!) 143/59 (!) 161/74 (!) 149/78  Pulse: 66 90 89 90  Resp: '18 18 20   '$ Temp: 98.1 F (36.7 C) 98.3 F (36.8 C) 98 F (36.7 C) 98.1 F (36.7 C)  TempSrc: Oral Oral Oral Oral  SpO2: 99% 100% 99% 97%  Weight:      Height:        Intake/Output Summary (Last 24 hours) at 09/09/2021 1740 Last data filed at 09/09/2021 1200 Gross per 24 hour  Intake --  Output 3600 ml  Net -3600 ml   Filed Weights   08/31/21 1458 09/06/21 0648  Weight: 67.1 kg 80.3 kg   Examination: Physical Exam:  Constitutional: WN/WD overweight Caucasian female chronically ill-appearing in no acute distress currently Respiratory: Diminished to auscultation bilaterally with coarse breath sounds, no wheezing, rales, rhonchi or crackles. Normal respiratory effort and patient is not tachypenic. No accessory muscle use.  Cardiovascular: RRR, no murmurs / rubs / gallops. S1 and S2 auscultated.  Has trace extremity edema Abdomen: Soft, tender to palpate, distended secondary to habitus. Bowel sounds positive.  GU: Deferred. Musculoskeletal: No clubbing / cyanosis of digits/nails. No joint deformity upper and lower extremities.  Skin: Left leg is a lot cooler than her right and right leg is warm and dry Neurologic: CN 2-12 grossly intact with no focal deficits. Romberg sign and cerebellar reflexes not assessed.  Psychiatric: Normal judgment and insight. Alert and oriented x 3. Normal mood and appropriate affect.   Data Reviewed: I have personally reviewed following labs and imaging studies  CBC: Recent Labs  Lab 09/02/21 1841 09/03/21 0247 09/04/21 0234 09/07/21 0444 09/09/21 0500  WBC 8.1 8.2 9.7 8.6 7.0  NEUTROABS  --   --  7.5 6.2 4.3  HGB 9.8* 8.8* 9.5* 8.2* 8.5*  HCT 29.1*  26.7* 28.9* 24.8* 26.1*  MCV 94.2 95.4 94.1 94.3 94.9  PLT 220 184 193 230 962   Basic Metabolic Panel: Recent Labs  Lab 09/03/21 0247 09/04/21 0234 09/05/21 0223 09/06/21 0252 09/07/21 0444 09/08/21 0547 09/09/21 0500  NA 130* 127* 128* 129* 130* 133* 134*  K 3.3* 3.6 3.1* 3.9 3.7 4.2 4.2  CL 101 96* 95* 98 100 102 102  CO2  21* '22 23 22 '$ 21* 24 25  GLUCOSE 104* 113* 99 93 86 92 89  BUN '15 14 14 15 12 12 11  '$ CREATININE 0.75 0.96 0.73 0.83 0.78 0.74 0.77  CALCIUM 7.6* 7.9* 7.8* 7.6* 7.7* 7.8* 8.0*  MG 1.6* 2.0 1.7  --  1.7  --  2.0  PHOS  --   --  2.1* 2.4* 3.1 3.0 3.4   GFR: Estimated Creatinine Clearance: 53.8 mL/min (by C-G formula based on SCr of 0.77 mg/dL). Liver Function Tests: Recent Labs  Lab 09/05/21 0223 09/06/21 0252 09/07/21 0444 09/08/21 0547 09/09/21 0500  AST  --   --   --   --  12*  ALT  --   --   --   --  14  ALKPHOS  --   --   --   --  50  BILITOT  --   --   --   --  0.7  PROT  --   --   --   --  5.1*  ALBUMIN 2.0* 2.1* 1.8* 2.0* 1.8*   No results for input(s): "LIPASE", "AMYLASE" in the last 168 hours. No results for input(s): "AMMONIA" in the last 168 hours. Coagulation Profile: No results for input(s): "INR", "PROTIME" in the last 168 hours. Cardiac Enzymes: No results for input(s): "CKTOTAL", "CKMB", "CKMBINDEX", "TROPONINI" in the last 168 hours. BNP (last 3 results) No results for input(s): "PROBNP" in the last 8760 hours. HbA1C: No results for input(s): "HGBA1C" in the last 72 hours. CBG: No results for input(s): "GLUCAP" in the last 168 hours. Lipid Profile: No results for input(s): "CHOL", "HDL", "LDLCALC", "TRIG", "CHOLHDL", "LDLDIRECT" in the last 72 hours. Thyroid Function Tests: No results for input(s): "TSH", "T4TOTAL", "FREET4", "T3FREE", "THYROIDAB" in the last 72 hours. Anemia Panel: No results for input(s): "VITAMINB12", "FOLATE", "FERRITIN", "TIBC", "IRON", "RETICCTPCT" in the last 72 hours. Sepsis Labs: Recent Labs   Lab 09/02/21 1841 09/02/21 2212  LATICACIDVEN 3.2* 1.5    Recent Results (from the past 240 hour(s))  Blood Culture (routine x 2)     Status: None   Collection Time: 08/31/21  4:03 PM   Specimen: BLOOD  Result Value Ref Range Status   Specimen Description   Final    BLOOD LEFT ANTECUBITAL Performed at [facility] [address], [place], [place] [address]    Special Requests   Final    BOTTLES DRAWN AEROBIC AND ANAEROBIC Blood Culture adequate volume Performed at [facility] [address], [place], [place] [address]    Culture   Final    NO GROWTH 5 DAYS Performed at [facility], [place] [address] [place], [place] [address]    Report Status 09/05/2021 FINAL  Final  Blood Culture (routine x 2)     Status: None   Collection Time: 08/31/21  4:30 PM   Specimen: BLOOD  Result Value Ref Range Status   Specimen Description   Final    BLOOD RIGHT ANTECUBITAL Performed at [facility] [address] [place], [place] [address]    Special Requests   Final    BOTTLES DRAWN AEROBIC ONLY Blood Culture adequate volume Performed at [facility] [address], [place], [place] [address]    Culture   Final    NO GROWTH 5 DAYS Performed at [facility], [place] [address], [place], [place] [address]    Report Status 09/05/2021 FINAL  Final  Urine Culture  Status: Abnormal   Collection Time: 08/31/21  6:28 PM   Specimen: In/Out Cath Urine  Result Value Ref Range Status   Specimen Description   Final    IN/OUT CATH URINE Performed at [facility], [place] [address] [place], [place] [address]    Special Requests   Final    NONE Performed at [facility], [place] [address], [place], [place] [address]    Culture (A)  Final    10,000 COLONIES/mL ENTEROCOCCUS FAECALIS >=100,000 COLONIES/mL LACTOBACILLUS SPECIES Standardized susceptibility testing for this  organism is not available. Performed at [facility], [place] [address], [place], [place] [address]    Report Status 09/03/2021 FINAL  Final   Organism ID, Bacteria ENTEROCOCCUS FAECALIS (A)  Final      Susceptibility   Enterococcus faecalis - MIC*    AMPICILLIN <=2 SENSITIVE Sensitive     NITROFURANTOIN <=16 SENSITIVE Sensitive     VANCOMYCIN 1 SENSITIVE Sensitive     * 10,000 COLONIES/mL ENTEROCOCCUS FAECALIS  Culture, blood (Routine X 2) w Reflex to ID Panel     Status: None   Collection Time: 09/02/21  6:47 PM   Specimen: BLOOD  Result Value Ref Range Status   Specimen Description   Final    BLOOD RIGHT ANTECUBITAL Performed at [facility] [address], [place], [place] [address]    Special Requests   Final    BOTTLES DRAWN AEROBIC AND ANAEROBIC Blood Culture adequate volume Performed at [facility] [address] [place], [place] [address]    Culture   Final    NO GROWTH 5 DAYS Performed at [facility], [place] [address] [place], [place] [address]    Report Status 09/07/2021 FINAL  Final  Culture, blood (Routine X 2) w Reflex to ID Panel     Status: None   Collection Time: 09/02/21  6:50 PM   Specimen: BLOOD  Result Value Ref Range Status   Specimen Description   Final    BLOOD BLOOD LEFT HAND Performed at [facility] [address] [place], [place] [address]    Special Requests   Final    BOTTLES DRAWN AEROBIC AND ANAEROBIC Blood Culture adequate volume Performed at [facility] [address] [place], [place] [address]    Culture   Final    NO GROWTH 5 DAYS Performed at [facility], [place] [address], [place], [place] [address]    Report Status 09/07/2021 FINAL  Final  MRSA Next Gen by PCR, Nasal     Status: None   Collection Time: 09/02/21  8:17 PM   Specimen: Nasal Mucosa; Nasal Swab  Result Value Ref Range Status   MRSA by PCR Next Gen NOT DETECTED NOT  DETECTED Final    Comment: (NOTE) The GeneXpert MRSA Assay (FDA approved for NASAL specimens only), is one component of a comprehensive MRSA colonization surveillance program. It is not intended to diagnose MRSA infection nor to guide or monitor treatment for MRSA infections. Test performance is not FDA approved in patients less than 65 years old. Performed at [facility], [place] [address], [place], [place] [address]   C Difficile Quick Screen (NO PCR Reflex)     Status: None   Collection Time: 09/05/21  7:58 AM   Specimen: STOOL  Result Value Ref Range Status   C Diff antigen NEGATIVE NEGATIVE Final   C Diff toxin NEGATIVE  NEGATIVE Final   C Diff interpretation No C. difficile detected.  Final    Comment: Performed at [facility], [place] [address], [place], [place] [address]  Gastrointestinal Panel by PCR , Stool     Status: None   Collection Time: 09/05/21  7:58 AM   Specimen: Stool  Result Value Ref Range Status   Campylobacter species NOT DETECTED NOT DETECTED Final   Plesimonas shigelloides NOT DETECTED NOT DETECTED Final   Salmonella species NOT DETECTED NOT DETECTED Final   Yersinia enterocolitica NOT DETECTED NOT DETECTED Final   Vibrio species NOT DETECTED NOT DETECTED Final   Vibrio cholerae NOT DETECTED NOT DETECTED Final   Enteroaggregative E coli (EAEC) NOT DETECTED NOT DETECTED Final   Enteropathogenic E coli (EPEC) NOT DETECTED NOT DETECTED Final   Enterotoxigenic E coli (ETEC) NOT DETECTED NOT DETECTED Final   Shiga like toxin producing E coli (STEC) NOT DETECTED NOT DETECTED Final   Shigella/Enteroinvasive E coli (EIEC) NOT DETECTED NOT DETECTED Final   Cryptosporidium NOT DETECTED NOT DETECTED Final   Cyclospora cayetanensis NOT DETECTED NOT DETECTED Final   Entamoeba histolytica NOT DETECTED NOT DETECTED Final   Giardia lamblia NOT DETECTED NOT DETECTED Final   Adenovirus F40/41 NOT DETECTED NOT DETECTED Final   Astrovirus  NOT DETECTED NOT DETECTED Final   Norovirus GI/GII NOT DETECTED NOT DETECTED Final   Rotavirus A NOT DETECTED NOT DETECTED Final   Sapovirus (I, II, IV, and V) NOT DETECTED NOT DETECTED Final    Comment: Performed at [facility], [address] [place], [place] [address]     Radiology Studies: CT ABDOMEN PELVIS W CONTRAST  Result Date: 09/09/2021 CLINICAL DATA:  Follow-up sigmoid diverticulitis. Increased abdominal pain. EXAM: CT ABDOMEN AND PELVIS WITH CONTRAST TECHNIQUE: Multidetector CT imaging of the abdomen and pelvis was performed using the standard protocol following bolus administration of intravenous contrast. RADIATION DOSE REDUCTION: This exam was performed according to the departmental dose-optimization program which includes automated exposure control, adjustment of the mA and/or kV according to patient size and/or use of iterative reconstruction technique. CONTRAST:  153m OMNIPAQUE IOHEXOL 300 MG/ML  SOLN COMPARISON:  09/03/2021 FINDINGS: Lower Chest: Stable mild left basilar atelectasis and small bilateral pleural effusions. Hepatobiliary: No hepatic masses identified. Prior cholecystectomy. No evidence of biliary obstruction. Pancreas: Small cystic lesion in the pancreatic head is seen measuring 2.1 x 1.5 cm. No other pancreatic lesions identified. Evidence of pancreatic ductal dilatation. Spleen: Within normal limits in size and appearance. Adrenals/Urinary Tract: No masses identified. No evidence of ureteral calculi or hydronephrosis. Foley catheter is seen within the urinary bladder. Stomach/Bowel: Large hiatal hernia again noted. Mild sigmoid diverticulitis shows no significant change. Evidence of abscess or extraluminal air. Small amount of free fluid in the right paracolic gutter is noted as well as diffuse body wall edema. Vascular/Lymphatic: No pathologically enlarged lymph nodes. No acute vascular findings. Aortic atherosclerotic calcification incidentally noted.  Reproductive: Prior hysterectomy noted. Adnexal regions are unremarkable in appearance. Other: Stable small paraumbilical ventral hernia, which contains only fat. Musculoskeletal:  No suspicious bone lesions identified. IMPRESSION: Mild sigmoid diverticulitis, without significant change. No evidence of abscess or other complication. Stable large hiatal hernia. Stable small paraumbilical ventral hernia containing only fat. 2.1 cm cystic lesion in pancreatic head. Recommend abdomen MRI without and with contrast for further characterization. Aortic Atherosclerosis (ICD10-I70.0). Electronically Signed   By: JMarlaine HindM.D.   On: 09/09/2021 15:29   VAS [place] WITH/WO TBI  Result Date:  09/09/2021  LOWER EXTREMITY DOPPLER STUDY Patient Name:  AILEY WESSLING  Date of Exam:   09/09/2021 Medical Rec #: [id number]        Accession #:    [id number] Date of Birth: 01-21-1935        Patient Gender: F Patient Age:   16 years Exam Location:  [facility] Hospital Procedure:      VAS [place] ABI WITH/WO TBI Referring Phys: Raiford Noble --------------------------------------------------------------------------------  Indications: Claudication. High Risk Factors: Hypertension.  Comparison Study: No previous exam noted. Performing Technologist: Bobetta Lime BS, RVT  Examination Guidelines: A complete evaluation includes at minimum, Doppler waveform signals and systolic blood pressure reading at the level of bilateral brachial, anterior tibial, and posterior tibial arteries, when vessel segments are accessible. Bilateral testing is considered an integral part of a complete examination. Photoelectric Plethysmograph (PPG) waveforms and toe systolic pressure readings are included as required and additional duplex testing as needed. Limited examinations for reoccurring indications may be performed as noted.  ABI Findings: +---------+------------------+-----+---------+--------+ Right    Rt Pressure (mmHg)IndexWaveform Comment   +---------+------------------+-----+---------+--------+ Brachial 162                    triphasic         +---------+------------------+-----+---------+--------+ PTA      163               1.01 triphasic         +---------+------------------+-----+---------+--------+ DP       170               1.05 triphasic         +---------+------------------+-----+---------+--------+ Great Toe115               0.71                   +---------+------------------+-----+---------+--------+ +---------+------------------+-----+---------+-------+ Left     Lt Pressure (mmHg)IndexWaveform Comment +---------+------------------+-----+---------+-------+ Brachial 159                    triphasic        +---------+------------------+-----+---------+-------+ PTA      146               0.90 triphasic        +---------+------------------+-----+---------+-------+ DP       169               1.04 triphasic        +---------+------------------+-----+---------+-------+ Great Toe98                0.60                  +---------+------------------+-----+---------+-------+ +-------+-----------+-----------+------------+------------+ ABI/TBIToday's ABIToday's TBIPrevious ABIPrevious TBI +-------+-----------+-----------+------------+------------+ Right  1.05       0.71                                +-------+-----------+-----------+------------+------------+ Left   1.04       0.60                                +-------+-----------+-----------+------------+------------+  Summary: Right: Resting right ankle-brachial index is within normal range. The right toe-brachial index is normal. Left: Resting left ankle-brachial index is within normal range. The left toe-brachial index is abnormal. *See table(s) above for measurements and observations.     Preliminary    DG Abd  1 View  Result Date: 09/08/2021 CLINICAL DATA:  Abdominal distension EXAM: ABDOMEN - 1 VIEW COMPARISON:  September 07, 2021. FINDINGS: The bowel gas pattern is normal. No radio-opaque calculi or other significant radiographic abnormality are seen. IMPRESSION: Negative. Electronically Signed   By: Dorise Bullion III M.D.   On: 09/08/2021 13:05    Scheduled Meds:  Chlorhexidine Gluconate Cloth  6 each Topical Daily   feeding supplement  1 Container Oral TID BM   feeding supplement (KATE FARMS STANDARD 1.4)  325 mL Oral BID BM   gabapentin  100 mg Oral BID   lidocaine  1 patch Transdermal Q24H   loperamide  2 mg Oral BID   melatonin  3 mg Oral QHS   metoprolol tartrate  12.5 mg Oral BID   pantoprazole  40 mg Oral Q0600   sodium chloride (PF)       tamsulosin  0.4 mg Oral Daily   Continuous Infusions:  0.9 % NaCl with KCl 20 mEq / L 50 mL/hr at 09/09/21 1602   piperacillin-tazobactam (ZOSYN)  IV 3.375 g (09/09/21 1621)    LOS: 8 days   Raiford Noble, DO [facility] Available via Epic secure chat 7am-7pm After these hours, please refer to coverage provider listed on [email or web address] 09/09/2021, 5:40 PM

## 2021-09-10 ENCOUNTER — Inpatient Hospital Stay (HOSPITAL_COMMUNITY): Payer: Medicare Other

## 2021-09-10 DIAGNOSIS — G9341 Metabolic encephalopathy: Secondary | ICD-10-CM | POA: Diagnosis not present

## 2021-09-10 LAB — CBC WITH DIFFERENTIAL/PLATELET
Abs Immature Granulocytes: 0.59 10*3/uL — ABNORMAL HIGH (ref 0.00–0.07)
Basophils Absolute: 0.1 10*3/uL (ref 0.0–0.1)
Basophils Relative: 1 %
Eosinophils Absolute: 0.1 10*3/uL (ref 0.0–0.5)
Eosinophils Relative: 1 %
HCT: 27.7 % — ABNORMAL LOW (ref 36.0–46.0)
Hemoglobin: 9 g/dL — ABNORMAL LOW (ref 12.0–15.0)
Immature Granulocytes: 6 %
Lymphocytes Relative: 10 %
Lymphs Abs: 1 10*3/uL (ref 0.7–4.0)
MCH: 30.6 pg (ref 26.0–34.0)
MCHC: 32.5 g/dL (ref 30.0–36.0)
MCV: 94.2 fL (ref 80.0–100.0)
Monocytes Absolute: 0.5 10*3/uL (ref 0.1–1.0)
Monocytes Relative: 5 %
Neutro Abs: 7.8 10*3/uL — ABNORMAL HIGH (ref 1.7–7.7)
Neutrophils Relative %: 77 %
Platelets: 302 10*3/uL (ref 150–400)
RBC: 2.94 MIL/uL — ABNORMAL LOW (ref 3.87–5.11)
RDW: 14.4 % (ref 11.5–15.5)
WBC: 10.1 10*3/uL (ref 4.0–10.5)
nRBC: 0.2 % (ref 0.0–0.2)

## 2021-09-10 LAB — COMPREHENSIVE METABOLIC PANEL
ALT: 20 U/L (ref 0–44)
AST: 20 U/L (ref 15–41)
Albumin: 1.8 g/dL — ABNORMAL LOW (ref 3.5–5.0)
Alkaline Phosphatase: 54 U/L (ref 38–126)
Anion gap: 9 (ref 5–15)
BUN: 12 mg/dL (ref 8–23)
CO2: 24 mmol/L (ref 22–32)
Calcium: 7.9 mg/dL — ABNORMAL LOW (ref 8.9–10.3)
Chloride: 100 mmol/L (ref 98–111)
Creatinine, Ser: 0.66 mg/dL (ref 0.44–1.00)
GFR, Estimated: >60 mL/min (ref >60)
Glucose, Bld: 91 mg/dL (ref 70–99)
Potassium: 4.1 mmol/L (ref 3.5–5.1)
Sodium: 133 mmol/L — ABNORMAL LOW (ref 135–145)
Total Bilirubin: 0.6 mg/dL (ref 0.3–1.2)
Total Protein: 5.1 g/dL — ABNORMAL LOW (ref 6.5–8.1)

## 2021-09-10 LAB — MAGNESIUM: Magnesium: 1.8 mg/dL (ref 1.7–2.4)

## 2021-09-10 LAB — PHOSPHORUS: Phosphorus: 3.2 mg/dL (ref 2.5–4.6)

## 2021-09-10 MED ORDER — TRAZODONE HCL 50 MG PO TABS
50.0000 mg | ORAL_TABLET | Freq: Every evening | ORAL | Status: DC | PRN
  Administered 2021-09-10 – 2021-09-11 (×2): 50 mg via ORAL
  Filled 2021-09-10 (×2): qty 1

## 2021-09-10 MED ORDER — IOHEXOL 350 MG/ML SOLN
125.0000 mL | Freq: Once | INTRAVENOUS | Status: AC | PRN
Start: 2021-09-10 — End: 2021-09-10
  Administered 2021-09-10: 125 mL via INTRAVENOUS

## 2021-09-10 MED ORDER — SENNOSIDES-DOCUSATE SODIUM 8.6-50 MG PO TABS: 1.0000 | ORAL_TABLET | Freq: Every evening | ORAL | Status: DC | PRN

## 2021-09-10 MED ORDER — HYDRALAZINE HCL 20 MG/ML IJ SOLN: 10.0000 mg | INTRAMUSCULAR | Status: DC | PRN

## 2021-09-10 MED ORDER — ZINC OXIDE 40 % EX OINT
TOPICAL_OINTMENT | CUTANEOUS | Status: DC | PRN
  Filled 2021-09-10 (×2): qty 57

## 2021-09-10 MED ORDER — SODIUM CHLORIDE (PF) 0.9 % IJ SOLN
INTRAMUSCULAR | Status: AC
  Filled 2021-09-10: qty 50

## 2021-09-10 MED ORDER — GUAIFENESIN 100 MG/5ML PO LIQD: 5.0000 mL | ORAL | Status: DC | PRN

## 2021-09-10 MED ORDER — IPRATROPIUM-ALBUTEROL 0.5-2.5 (3) MG/3ML IN SOLN: 3.0000 mL | RESPIRATORY_TRACT | Status: DC | PRN

## 2021-09-10 MED ORDER — METOPROLOL TARTRATE 5 MG/5ML IV SOLN
5.0000 mg | INTRAVENOUS | Status: DC | PRN
  Administered 2021-09-11: 5 mg via INTRAVENOUS
  Filled 2021-09-10: qty 5

## 2021-09-10 NOTE — Progress Notes (Signed)
PROGRESS NOTE    Caitlin Chavez  FWY:[id number] DOB: [birth year] DOA: 08/31/2021 PCP: Sueanne Margarita, DO   Brief Narrative:   87 year old female with history of chronic A-fib on Eliquis, hypertension, chronic UTIs presents the ER today with confusion and weakness.  Patient has recent history of UTI secondary to E. coli that was treated with multiple antibiotics, as well as, steroid treatment for neck pain.  Patient was admitted for acute encephalopathy.  Hospital course has been complicated by sepsis, abdominal pain, diarrhea stools and electrolyte abnormalities.  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  GI team consulted.   Assessment & Plan:  Principal Problem:   Acute metabolic encephalopathy Active Problems:   Generalized weakness   Essential hypertension   Chronic a-fib (HCC)   Sigmoid diverticulitis   Chronic anticoagulation - on eliquis for afib   Cervical pain (neck)   Hypokalemia   GI bleed   Pancreatic mass    Acute metabolic encephalopathy, presents on admission  Acute sigmoid diverticulitis -Work-up has revealed acute sigmoid diverticulitis, currently getting IV Zosyn.  Repeat CT scan shows persistent sigmoid inflammation.   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  Poor surgical candidate but CA 19-9 ordered.   Left Leg Coolness -ABI unremarkable.  CTA of bilateral aorta runoff has been ordered.   Acute blood loss anemia -Appears to have improved.  Could be from diverticulitis.  Eliquis is currently on hold   Hyponatremia: -Stable around 133.  Hypokalemia/hypomagnesemia/phosphatemia -Replete as needed      Essential HTN,  -On Lopressor twice daily   PAF,  -On Lopressor.  Continue to monitor and replete electrolytes as needed.  Eliquis is on hold.     Cervical pain  (neck) -CT c spine:  No acute fracture, does has  Multilevel degenerative changes.- -Pain control.  Follow-up outpatient- [facility].  On gabapentin twice daily.    Adult failure to thrive -Supplements.  Poor oral intake.  PT/OT recommending SNF. Family reports patient at [facility] able to  ambulate independently , lives byherself, family comes in daily , she does not drive , no recent falls , became weak in the last few days  Son reports patient has poor appetite and 15lbs weight loss in last 3-6 months Seen by PT eval who recommend SNF,  family agrees SNF placement and can discharge once medically stable to SNF however patient refuses going to SNF   Gaseous distention of the abdomen: - early ambulation.   Acute urinary retention Foley placed, will need outpatient urology follow-up.  Flomax   Hypoalbuminemia  albumin given by previous provider.  Need to increase nutritional intake   DVT prophylaxis: SCDs Start: 09/01/21 0029 Code Status: DNR Family Communication: Son present at bedside  Patient still feels quite weak.  Mentation is better, some abdominal pain but tolerating her soft diet.   Nutritional status    Signs/Symptoms: estimated needs  Interventions: Refer to RD note for recommendations  Body mass index is 29.46 kg/m.         Subjective: Seen and examined at bedside, still feels very weak.  Denies any pain swelling nor erythema of her lower extremity.  When I asked her it is slightly cool to touch, she has not have any complaints besides is feeling weak.  Doing  Examination:  General exam: Chronically ill and  weak appearing Respiratory system: Clear to auscultation. Respiratory effort normal. Cardiovascular system: S1 & S2 heard, RRR. No JVD, murmurs, rubs, gallops or clicks. No pedal edema. Gastrointestinal system: Abdomen is nondistended, soft and nontender. No organomegaly or masses felt. Normal bowel sounds heard. Central nervous system: Alert  and oriented. No focal neurological deficits. Extremities: Symmetric 5 x 5 power. Skin: No rashes, lesions or ulcers Psychiatry: Judgement and insight appear normal. Mood & affect appropriate.  Left lower extremity toes slightly look cyanotic cool to touch but does have good pulses.  Foley catheter in place Objective: Vitals:   09/09/21 0509 09/09/21 1352 09/09/21 2135 09/10/21 0453  BP: (!) 161/74 (!) 149/78 (!) 162/82 127/70  Pulse: 89 90 (!) 101 (!) 103  Resp: 20   19  Temp: 98 F (36.7 C) 98.1 F (36.7 C) 98.3 F (36.8 C) 98.8 F (37.1 C)  TempSrc: Oral Oral Oral Oral  SpO2: 99% 97% 97% 98%  Weight:      Height:        Intake/Output Summary (Last 24 hours) at 09/10/2021 0829 Last data filed at 09/10/2021 0404 Gross per 24 hour  Intake --  Output 2900 ml  Net -2900 ml   Filed Weights   08/31/21 1458 09/06/21 0648  Weight: 67.1 kg 80.3 kg     Data Reviewed:   CBC: Recent Labs  Lab 09/04/21 0234 09/07/21 0444 09/09/21 0500 09/10/21 0541  WBC 9.7 8.6 7.0 10.1  NEUTROABS 7.5 6.2 4.3 7.8*  HGB 9.5* 8.2* 8.5* 9.0*  HCT 28.9* 24.8* 26.1* 27.7*  MCV 94.1 94.3 94.9 94.2  PLT 193 230 267 678   Basic Metabolic Panel: Recent Labs  Lab 09/04/21 0234 09/04/21 0234 09/05/21 0223 09/06/21 0252 09/07/21 0444 09/08/21 0547 09/09/21 0500 09/10/21 0541  NA 127*  --  128* 129* 130* 133* 134* 133*  K 3.6  --  3.1* 3.9 3.7 4.2 4.2 4.1  CL 96*  --  95* 98 100 102 102 100  CO2 22  --  23 22 21* '24 25 24  '$ GLUCOSE 113*  --  99 93 86 92 89 91  BUN 14  --  '14 15 12 12 11 12  '$ CREATININE 0.96  --  0.73 0.83 0.78 0.74 0.77 0.66  CALCIUM 7.9*  --  7.8* 7.6* 7.7* 7.8* 8.0* 7.9*  MG 2.0  --  1.7  --  1.7  --  2.0 1.8  PHOS  --    < > 2.1* 2.4* 3.1 3.0 3.4 3.2   < > = values in this interval not displayed.   GFR: Estimated Creatinine Clearance: 53.8 mL/min (by C-G formula based on SCr of 0.66 mg/dL). Liver Function Tests: Recent Labs  Lab 09/06/21 0252 09/07/21 0444  09/08/21 0547 09/09/21 0500 09/10/21 0541  AST  --   --   --  12* 20  ALT  --   --   --  14 20  ALKPHOS  --   --   --  50 54  BILITOT  --   --   --  0.7 0.6  PROT  --   --   --  5.1* 5.1*  ALBUMIN 2.1* 1.8* 2.0* 1.8* 1.8*   No results for input(s): "LIPASE", "AMYLASE" in the last 168 hours. No results for input(s): "AMMONIA" in the last 168 hours. Coagulation Profile: No results for input(s): "INR", "PROTIME" in the last 168 hours. Cardiac Enzymes: No results for input(s): "CKTOTAL", "CKMB", "CKMBINDEX", "TROPONINI" in the last 168  hours. BNP (last 3 results) No results for input(s): "PROBNP" in the last 8760 hours. HbA1C: No results for input(s): "HGBA1C" in the last 72 hours. CBG: No results for input(s): "GLUCAP" in the last 168 hours. Lipid Profile: No results for input(s): "CHOL", "HDL", "LDLCALC", "TRIG", "CHOLHDL", "LDLDIRECT" in the last 72 hours. Thyroid Function Tests: No results for input(s): "TSH", "T4TOTAL", "FREET4", "T3FREE", "THYROIDAB" in the last 72 hours. Anemia Panel: No results for input(s): "VITAMINB12", "FOLATE", "FERRITIN", "TIBC", "IRON", "RETICCTPCT" in the last 72 hours. Sepsis Labs: No results for input(s): "PROCALCITON", "LATICACIDVEN" in the last 168 hours.  Recent Results (from the past 240 hour(s))  Blood Culture (routine x 2)     Status: None   Collection Time: 08/31/21  4:03 PM   Specimen: BLOOD  Result Value Ref Range Status   Specimen Description   Final    BLOOD LEFT ANTECUBITAL Performed at [facility] [address] [place], [place] [address]    Special Requests   Final    BOTTLES DRAWN AEROBIC AND ANAEROBIC Blood Culture adequate volume Performed at [facility] [address], [place], [place] [address]    Culture   Final    NO GROWTH 5 DAYS Performed at [facility], [place] [address], [place], [place] [address]    Report Status 09/05/2021 FINAL  Final  Blood Culture (routine x 2)      Status: None   Collection Time: 08/31/21  4:30 PM   Specimen: BLOOD  Result Value Ref Range Status   Specimen Description   Final    BLOOD RIGHT ANTECUBITAL Performed at [facility] [address] [place], [place] [address]    Special Requests   Final    BOTTLES DRAWN AEROBIC ONLY Blood Culture adequate volume Performed at [facility] [address], [place], [place] [address]    Culture   Final    NO GROWTH 5 DAYS Performed at [facility], [place] [address], [place], [place] [address]    Report Status 09/05/2021 FINAL  Final  Urine Culture     Status: Abnormal   Collection Time: 08/31/21  6:28 PM   Specimen: In/Out Cath Urine  Result Value Ref Range Status   Specimen Description   Final    IN/OUT CATH URINE Performed at [facility] [address], [place], [place] [address]    Special Requests   Final    NONE Performed at [facility], [place] [address], [place], [place] [address]    Culture (A)  Final    10,000 COLONIES/mL ENTEROCOCCUS FAECALIS >=100,000 COLONIES/mL LACTOBACILLUS SPECIES Standardized susceptibility testing for this organism is not available. Performed at [facility], [place] [address], [place], [place] [address]    Report Status 09/03/2021 FINAL  Final   Organism ID, Bacteria ENTEROCOCCUS FAECALIS (A)  Final      Susceptibility   Enterococcus faecalis - MIC*    AMPICILLIN <=2 SENSITIVE Sensitive     NITROFURANTOIN <=16 SENSITIVE Sensitive     VANCOMYCIN 1 SENSITIVE Sensitive     * 10,000 COLONIES/mL ENTEROCOCCUS FAECALIS  Culture, blood (Routine X 2) w Reflex to ID Panel     Status: None   Collection Time: 09/02/21  6:47 PM   Specimen: BLOOD  Result Value Ref Range Status   Specimen Description   Final    BLOOD RIGHT ANTECUBITAL Performed at [facility] [address]  [place], [place] [address]    Special Requests   Final    BOTTLES  DRAWN AEROBIC AND ANAEROBIC Blood Culture adequate volume Performed at [facility] [address], [place], [place] [address]    Culture   Final    NO GROWTH 5 DAYS Performed at [facility], [place] [address], [place], [place] [address]    Report Status 09/07/2021 FINAL  Final  Culture, blood (Routine X 2) w Reflex to ID Panel     Status: None   Collection Time: 09/02/21  6:50 PM   Specimen: BLOOD  Result Value Ref Range Status   Specimen Description   Final    BLOOD BLOOD LEFT HAND Performed at [facility] [address], [place], [place] [address]    Special Requests   Final    BOTTLES DRAWN AEROBIC AND ANAEROBIC Blood Culture adequate volume Performed at [facility] [address] [place], [place] [address]    Culture   Final    NO GROWTH 5 DAYS Performed at [facility], [place] [address] [place], [place] [address]    Report Status 09/07/2021 FINAL  Final  MRSA Next Gen by PCR, Nasal     Status: None   Collection Time: 09/02/21  8:17 PM   Specimen: Nasal Mucosa; Nasal Swab  Result Value Ref Range Status   MRSA by PCR Next Gen NOT DETECTED NOT DETECTED Final    Comment: (NOTE) The GeneXpert MRSA Assay (FDA approved for NASAL specimens only), is one component of a comprehensive MRSA colonization surveillance program. It is not intended to diagnose MRSA infection nor to guide or monitor treatment for MRSA infections. Test performance is not FDA approved in patients less than 27 years old. Performed at [facility], [place] [address] [place], [place] [address]   C Difficile Quick Screen (NO PCR Reflex)     Status: None   Collection Time: 09/05/21  7:58 AM   Specimen: STOOL  Result Value Ref Range Status   C Diff antigen NEGATIVE NEGATIVE Final   C Diff toxin NEGATIVE NEGATIVE Final   C Diff interpretation No C. difficile detected.  Final    Comment: Performed at [facility], [place] [address] [place], [place] [address]  Gastrointestinal Panel by PCR , Stool     Status: None   Collection Time: 09/05/21  7:58 AM   Specimen: Stool  Result Value Ref Range Status   Campylobacter species NOT DETECTED NOT DETECTED Final   Plesimonas shigelloides NOT DETECTED NOT DETECTED Final   Salmonella species NOT DETECTED NOT DETECTED Final   Yersinia enterocolitica NOT DETECTED NOT DETECTED Final   Vibrio species NOT DETECTED NOT DETECTED Final   Vibrio cholerae NOT DETECTED NOT DETECTED Final   Enteroaggregative E coli (EAEC) NOT DETECTED NOT DETECTED Final   Enteropathogenic E coli (EPEC) NOT DETECTED NOT DETECTED Final   Enterotoxigenic E coli (ETEC) NOT DETECTED NOT DETECTED Final   Shiga like toxin producing E coli (STEC) NOT DETECTED NOT DETECTED Final   Shigella/Enteroinvasive E coli (EIEC) NOT DETECTED NOT DETECTED Final   Cryptosporidium NOT DETECTED NOT DETECTED Final   Cyclospora cayetanensis NOT DETECTED NOT DETECTED Final   Entamoeba histolytica NOT DETECTED NOT DETECTED Final   Giardia lamblia NOT DETECTED NOT DETECTED Final   Adenovirus F40/41 NOT DETECTED NOT DETECTED Final   Astrovirus NOT DETECTED NOT DETECTED Final   Norovirus GI/GII NOT DETECTED NOT DETECTED Final  Rotavirus A NOT DETECTED NOT DETECTED Final   Sapovirus (I, II, IV, and V) NOT DETECTED NOT DETECTED Final    Comment: Performed at [facility], [address], [place], [place] [address]         Radiology Studies: CT ABDOMEN PELVIS W CONTRAST  Result Date: 09/09/2021 CLINICAL DATA:  Follow-up sigmoid diverticulitis. Increased abdominal pain. EXAM: CT ABDOMEN AND PELVIS WITH CONTRAST TECHNIQUE: Multidetector CT imaging of the abdomen and pelvis was performed using the standard protocol following bolus administration of intravenous contrast. RADIATION DOSE REDUCTION: This exam was performed according to the departmental dose-optimization program which includes automated  exposure control, adjustment of the mA and/or kV according to patient size and/or use of iterative reconstruction technique. CONTRAST:  141m OMNIPAQUE IOHEXOL 300 MG/ML  SOLN COMPARISON:  09/03/2021 FINDINGS: Lower Chest: Stable mild left basilar atelectasis and small bilateral pleural effusions. Hepatobiliary: No hepatic masses identified. Prior cholecystectomy. No evidence of biliary obstruction. Pancreas: Small cystic lesion in the pancreatic head is seen measuring 2.1 x 1.5 cm. No other pancreatic lesions identified. Evidence of pancreatic ductal dilatation. Spleen: Within normal limits in size and appearance. Adrenals/Urinary Tract: No masses identified. No evidence of ureteral calculi or hydronephrosis. Foley catheter is seen within the urinary bladder. Stomach/Bowel: Large hiatal hernia again noted. Mild sigmoid diverticulitis shows no significant change. Evidence of abscess or extraluminal air. Small amount of free fluid in the right paracolic gutter is noted as well as diffuse body wall edema. Vascular/Lymphatic: No pathologically enlarged lymph nodes. No acute vascular findings. Aortic atherosclerotic calcification incidentally noted. Reproductive: Prior hysterectomy noted. Adnexal regions are unremarkable in appearance. Other: Stable small paraumbilical ventral hernia, which contains only fat. Musculoskeletal:  No suspicious bone lesions identified. IMPRESSION: Mild sigmoid diverticulitis, without significant change. No evidence of abscess or other complication. Stable large hiatal hernia. Stable small paraumbilical ventral hernia containing only fat. 2.1 cm cystic lesion in pancreatic head. Recommend abdomen MRI without and with contrast for further characterization. Aortic Atherosclerosis (ICD10-I70.0). Electronically Signed   By: JMarlaine HindM.D.   On: 09/09/2021 15:29   VAS [place] WITH/WO TBI  Result Date: 09/09/2021  LOWER EXTREMITY DOPPLER STUDY Patient Name:  Caitlin Chavez Date of Exam:    09/09/2021 Medical Rec #: [id number]       Accession #:    [id number] of Birth: 4Nov 03, 1936       Patient Gender: F Patient Age:   89years [facility]:      [facility] [place]I WITH/WO TBI Referring Phys: ORaiford Noble--------------------------------------------------------------------------------  Indications: Claudication. High Risk Factors: Hypertension.  Comparison Study: No previous exam noted. Performing Technologist: RBobetta LimeBS, RVT  Examination Guidelines: A complete evaluation includes at minimum, Doppler waveform signals and systolic blood pressure reading at the level of bilateral brachial, anterior tibial, and posterior tibial arteries, when vessel segments are accessible. Bilateral testing is considered an integral part of a complete examination. Photoelectric Plethysmograph (PPG) waveforms and toe systolic pressure readings are included as required and additional duplex testing as needed. Limited examinations for reoccurring indications may be performed as noted.  ABI Findings: +---------+------------------+-----+---------+--------+ Right    Rt Pressure (mmHg)IndexWaveform Comment  +---------+------------------+-----+---------+--------+ Brachial 162                    triphasic         +---------+------------------+-----+---------+--------+ PTA      163  1.01 triphasic         +---------+------------------+-----+---------+--------+ DP       170               1.05 triphasic         +---------+------------------+-----+---------+--------+ Great Toe115               0.71                   +---------+------------------+-----+---------+--------+ +---------+------------------+-----+---------+-------+ Left     Lt Pressure (mmHg)IndexWaveform Comment +---------+------------------+-----+---------+-------+ Brachial 159                    triphasic        +---------+------------------+-----+---------+-------+ PTA      146                0.90 triphasic        +---------+------------------+-----+---------+-------+ DP       169               1.04 triphasic        +---------+------------------+-----+---------+-------+ Great Toe98                0.60                  +---------+------------------+-----+---------+-------+ +-------+-----------+-----------+------------+------------+ ABI/TBIToday's ABIToday's TBIPrevious ABIPrevious TBI +-------+-----------+-----------+------------+------------+ Right  1.05       0.71                                +-------+-----------+-----------+------------+------------+ Left   1.04       0.60                                +-------+-----------+-----------+------------+------------+  Summary: Right: Resting right ankle-brachial index is within normal range. The right toe-brachial index is normal. Left: Resting left ankle-brachial index is within normal range. The left toe-brachial index is abnormal. *See table(s) above for measurements and observations.     Preliminary    DG Abd 1 View  Result Date: 09/08/2021 CLINICAL DATA:  Abdominal distension EXAM: ABDOMEN - 1 VIEW COMPARISON:  September 07, 2021. FINDINGS: The bowel gas pattern is normal. No radio-opaque calculi or other significant radiographic abnormality are seen. IMPRESSION: Negative. Electronically Signed   By: Dorise Bullion III M.D.   On: 09/08/2021 13:05        Scheduled Meds:  Chlorhexidine Gluconate Cloth  6 each Topical Daily   feeding supplement  1 Container Oral TID BM   feeding supplement (KATE FARMS STANDARD 1.4)  325 mL Oral BID BM   gabapentin  100 mg Oral BID   lidocaine  1 patch Transdermal Q24H   loperamide  2 mg Oral BID   melatonin  3 mg Oral QHS   metoprolol tartrate  12.5 mg Oral BID   pantoprazole  40 mg Oral Q0600   tamsulosin  0.4 mg Oral Daily   Continuous Infusions:  piperacillin-tazobactam (ZOSYN)  IV 3.375 g (09/09/21 2345)     LOS: 9 days   Time spent= 35  mins    Azrael Huss Arsenio Loader, MD [facility]  If 7PM-7AM, please contact night-coverage  09/10/2021, 8:29 AM

## 2021-09-10 NOTE — Progress Notes (Signed)
RN offered to get pt out of bed prior to going to sleep last night. Pt refused and said she will try tomorrow. I have educated the pt on importance of getting out of the bed and walking with a walker. She has verbalized that she will attempt to get up and move today. This was passed along to the dayshift nurse.

## 2021-09-10 NOTE — Progress Notes (Addendum)
Patient Name: Caitlin Chavez Date of Encounter: 09/10/2021, 8:48 AM    Subjective  Still complaining of some abdominal pain Not getting out of bed and moving though she realizes she should   Objective  BP 127/70 (BP Location: Left Arm)   Pulse (!) 103   Temp 98.8 F (37.1 C) (Oral)   Resp 19   Ht '5\' 5"'$  (1.651 m)   Wt 80.3 kg   SpO2 98%   BMI 29.46 kg/m  Elderly obese woman in bed no acute distress Abdomen is obese soft and with deep palpation in the left lower quadrant there is some tenderness.  There are no peritoneal findings i.e. rebound guarding. Brown stool in Flexi-Seal rectal tube     Latest Ref Rng & Units 09/10/2021    5:41 AM 09/09/2021    5:00 AM 09/07/2021    4:44 AM  CBC  WBC 4.0 - 10.5 K/uL 10.1  7.0  8.6   Hemoglobin 12.0 - 15.0 g/dL 9.0  8.5  8.2   Hematocrit 36.0 - 46.0 % 27.7  26.1  24.8   Platelets 150 - 400 K/uL 302  267  230    Lab Results  Component Value Date   CREATININE 0.66 09/10/2021   BUN 12 09/10/2021   NA 133 (L) 09/10/2021   K 4.1 09/10/2021   CL 100 09/10/2021   CO2 24 09/10/2021   CT abdomen pelvis with contrast 09/09/2021 images reviewed IMPRESSION: Mild sigmoid diverticulitis, without significant change. No evidence of abscess or other complication.   Stable large hiatal hernia.   Stable small paraumbilical ventral hernia containing only fat.   2.1 cm cystic lesion in pancreatic head. Recommend abdomen MRI without and with contrast for further characterization.   Aortic Atherosclerosis (ICD10-I70.0).   Assessment and Plan  #1 acute sigmoid diverticulitis on antibiotics for 9 days -Zosyn since 8/6, 8/3-8/5 was on cefepime and metronidazole   #2 abdominal pain thought to be associated with above, had Foley catheter placed due to high postvoid residual, had a transient ileus, pain same or worse since -I think a  less tender today.  CT with stable mild diverticulitis and no new findings.     #3 diarrhea negative  infectious work-up, Flexi-Seal in place since 8/7, Imodium started yesterday.   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  However after 3 CT scans, normal white count, and based upon exam today I think she is slowly responding to therapy.  It would not be unusual to still have some pain and she is not as tender on exam as earlier in the admission.  She is taking 0.5 tablet of a 5-325 mg Norco with success.  Would continue to treat diverticulitis for 14 days total.  While she is here she can have IV antibiotics.  Could transition to Augmentin when ready to go.   Major issue is mobilization and failure to participate in refusal to go to SNF.   I think it is okay to resume anticoagulation   I am discontinuing the rectal tube.  Want to avoid a stercoral ulcer.  Regarding the pancreatic lesion I would not work it up while here.  Not sure it needs an MRI given clinical context age etc.  That can be decided as an outpatient.  Family not present for discussion today.  Gatha Mayer, MD, [facility] See Shea Evans on call - gastroenterology for best contact person 09/10/2021 8:48 AM

## 2021-09-10 NOTE — TOC Progression Note (Signed)
Transition of Care [facility]) - Progression Note    Patient Details  Name: Caitlin Chavez MRN: [id number] Date of Birth: [birth year]  Transition of Care [facility]) CM/SW Contact  Ross Ludwig, [place] Phone Number: 09/10/2021, 6:16 PM  Clinical Narrative:     Patient has been faxed out awaiting bed offers for SNF placement.  Patient will also need insurance authorization.   Expected Discharge Plan:  (TBD) Barriers to Discharge: Continued Medical Work up  Expected Discharge Plan and Services Expected Discharge Plan:  (TBD)   Discharge Planning Services: CM Consult Post Acute Care Choice: [place] Living arrangements for the past 2 months: Single Family Home                                       Social Determinants of Health (SDOH) Interventions    Readmission Risk Interventions     No data to display

## 2021-09-10 NOTE — [place] FL2 (Signed)
[place] MEDICAID FL2 LEVEL OF CARE SCREENING TOOL     IDENTIFICATION  Patient Name: Caitlin Chavez Birthdate: [birth year] Sex: female Admission Date (Current Location): 08/31/2021  [facility] and [place] Number:  Herbalist and Address:  [facility],  [address], [place]      Provider Number: [id number]  Attending Physician Name and Address:  Damita Lack, MD  Relative Name and Phone Number:  Zya, Finkle [phone]    Angell,rodney Son   [phone]    Current Level of Care: SNF Recommended Level of Care: [place] Prior Approval Number:    Date Approved/Denied:   PASRR Number: [id number]  Discharge Plan: SNF    Current Diagnoses: Patient Active Problem List   Diagnosis Date Noted   Pancreatic mass 09/08/2021   GI bleed 81/01/7508   Acute metabolic encephalopathy 25/85/2778   Sigmoid diverticulitis 08/31/2021   Chronic anticoagulation - on eliquis for afib 08/31/2021   Cervical pain (neck) 08/31/2021   Hypokalemia 08/31/2021   Allergy to alpha-gal 01/14/2020   Hypomagnesemia 12/10/2018   Chronic a-fib ([place]) 12/09/2018   GERD (gastroesophageal reflux disease)    Peripheral neuropathy    Anemia    History of gout 11/01/2018   Acute bilateral ankle pain 10/31/2018   Failure to thrive in adult 10/29/2018   Ankle fracture, right 10/29/2018   Falls 10/28/2018   Hiatal hernia 10/28/2018   Hyponatremia 10/28/2018   Generalized weakness 10/28/2018   Essential hypertension 10/28/2018   Hypoalbuminemia 10/28/2018   Anxiety 10/28/2018   CKD (chronic kidney disease) stage 3, GFR 30-59 ml/min (HCC) 09/04/2018   Essential tremor 07/09/2017    Orientation RESPIRATION BLADDER Height & Weight     Self, Time  Normal Incontinent Weight: 177 lb 0.5 oz (80.3 kg) Height:  '5\' 5"'$  (165.1 cm)  BEHAVIORAL SYMPTOMS/MOOD NEUROLOGICAL BOWEL NUTRITION STATUS      Incontinent Diet (Cardiac)  AMBULATORY STATUS  COMMUNICATION OF NEEDS Skin   Limited Assist Verbally Normal                       Personal Care Assistance Level of Assistance  Bathing, Feeding, Dressing Bathing Assistance: Limited assistance Feeding assistance: Limited assistance Dressing Assistance: Limited assistance     Functional Limitations Info  Sight, Speech, Hearing Sight Info: Adequate Hearing Info: Adequate Speech Info: Adequate    SPECIAL CARE FACTORS FREQUENCY  OT (By licensed OT), PT (By licensed PT)     PT Frequency: Minimum 5x a week OT Frequency: Minimum 5x a week            Contractures Contractures Info: Not present    Additional Factors Info  Code Status, Allergies Code Status Info: DNR Allergies Info: Alka-seltzer (Aspirin Effervescent)   Alpha-gal   Beef-derived Products   Nsaids   Other   Pork-derived Products   Codeine           Current Medications (09/10/2021):  This is the current hospital active medication list Current Facility-Administered Medications  Medication Dose Route Frequency Provider Last Rate Last Admin   acetaminophen (TYLENOL) tablet 650 mg  650 mg Oral Q6H PRN Kristopher Oppenheim, DO   650 mg at 09/07/21 1509   Or   acetaminophen (TYLENOL) suppository 650 mg  650 mg Rectal Q6H PRN Kristopher Oppenheim, DO       Chlorhexidine Gluconate Cloth 2 % PADS 6 each  6 each Topical Daily Florencia Reasons, MD   6 each at 09/10/21  1002   feeding supplement (BOOST / RESOURCE BREEZE) liquid 1 Container  1 Container Oral TID BM Dana Allan I, MD   1 Container at 09/08/21 1408   feeding supplement (KATE FARMS STANDARD 1.4) liquid 325 mL  325 mL Oral BID BM Florencia Reasons, MD   325 mL at 09/10/21 1628   gabapentin (NEURONTIN) capsule 100 mg  100 mg Oral BID Florencia Reasons, MD   100 mg at 09/10/21 0957   guaiFENesin (ROBITUSSIN) 100 MG/5ML liquid 5 mL  5 mL Oral Q4H PRN Amin, Ankit Chirag, MD       hydrALAZINE (APRESOLINE) injection 10 mg  10 mg Intravenous Q4H PRN Amin, Ankit Chirag, MD       HYDROcodone-acetaminophen  (NORCO/VICODIN) 5-325 MG per tablet 0.5 tablet  0.5 tablet Oral Q4H PRN Florencia Reasons, MD   0.5 tablet at 09/10/21 1412   ipratropium-albuterol (DUONEB) 0.5-2.5 (3) MG/3ML nebulizer solution 3 mL  3 mL Nebulization Q4H PRN Amin, Ankit Chirag, MD       lidocaine (LIDODERM) 5 % 1 patch  1 patch Transdermal Q24H Foust, Katy L, NP   1 patch at 09/10/21 1026   lidocaine (XYLOCAINE) 2 % viscous mouth solution 15 mL  15 mL Mouth/Throat Q4H PRN Florencia Reasons, MD   15 mL at 09/02/21 1417   liver oil-zinc oxide (DESITIN) 40 % ointment   Topical PRN Amin, Ankit Chirag, MD       loperamide (IMODIUM) capsule 2 mg  2 mg Oral BID Esterwood, Amy S, PA-C   2 mg at 09/10/21 0957   melatonin tablet 3 mg  3 mg Oral QHS Florencia Reasons, MD   3 mg at 09/09/21 2234   metoprolol tartrate (LOPRESSOR) injection 5 mg  5 mg Intravenous Q4H PRN Amin, Ankit Chirag, MD       metoprolol tartrate (LOPRESSOR) tablet 12.5 mg  12.5 mg Oral BID Florencia Reasons, MD   12.5 mg at 09/10/21 0957   ondansetron (ZOFRAN) tablet 4 mg  4 mg Oral Q6H PRN Kristopher Oppenheim, DO       Or   ondansetron [facility]) injection 4 mg  4 mg Intravenous Q6H PRN Kristopher Oppenheim, DO   4 mg at 09/02/21 1147   Oral care mouth rinse  15 mL Mouth Rinse PRN Florencia Reasons, MD       pantoprazole (PROTONIX) EC tablet 40 mg  40 mg Oral Q0600 Esterwood, Amy S, PA-C   40 mg at 09/10/21 0957   piperacillin-tazobactam (ZOSYN) IVPB 3.375 g  3.375 g Intravenous Q8H Lenis Noon, RPH 12.5 mL/hr at 09/10/21 1627 3.375 g at 09/10/21 1627   senna-docusate (Senokot-S) tablet 1 tablet  1 tablet Oral QHS PRN Amin, Ankit Chirag, MD       sodium chloride (PF) 0.9 % injection            tamsulosin (FLOMAX) capsule 0.4 mg  0.4 mg Oral Daily Dana Allan I, MD   0.4 mg at 09/10/21 0957   traZODone (DESYREL) tablet 50 mg  50 mg Oral QHS PRN Damita Lack, MD         Discharge Medications: Please see discharge summary for a list of discharge medications.  Relevant Imaging Results:  Relevant Lab  Results:   Additional Information SSN: [id number]  Ross Ludwig, LCSW

## 2021-09-10 NOTE — Progress Notes (Signed)
Rectal tube removed.  Pt stood at bedside two times today.  Dangled at bedside for 15 minutes.  Tolerated fair.    Will continue to encourage mobility.

## 2021-09-11 DIAGNOSIS — K8689 Other specified diseases of pancreas: Secondary | ICD-10-CM | POA: Diagnosis not present

## 2021-09-11 DIAGNOSIS — K5732 Diverticulitis of large intestine without perforation or abscess without bleeding: Secondary | ICD-10-CM | POA: Diagnosis not present

## 2021-09-11 DIAGNOSIS — Z7901 Long term (current) use of anticoagulants: Secondary | ICD-10-CM | POA: Diagnosis not present

## 2021-09-11 DIAGNOSIS — G9341 Metabolic encephalopathy: Secondary | ICD-10-CM | POA: Diagnosis not present

## 2021-09-11 DIAGNOSIS — R531 Weakness: Secondary | ICD-10-CM | POA: Diagnosis not present

## 2021-09-11 LAB — CBC
HCT: 27.9 % — ABNORMAL LOW (ref 36.0–46.0)
Hemoglobin: 9.1 g/dL — ABNORMAL LOW (ref 12.0–15.0)
MCH: 31 pg (ref 26.0–34.0)
MCHC: 32.6 g/dL (ref 30.0–36.0)
MCV: 94.9 fL (ref 80.0–100.0)
Platelets: 288 10*3/uL (ref 150–400)
RBC: 2.94 MIL/uL — ABNORMAL LOW (ref 3.87–5.11)
RDW: 14.6 % (ref 11.5–15.5)
WBC: 8.4 10*3/uL (ref 4.0–10.5)
nRBC: 0 % (ref 0.0–0.2)

## 2021-09-11 LAB — MAGNESIUM: Magnesium: 1.8 mg/dL (ref 1.7–2.4)

## 2021-09-11 LAB — CANCER ANTIGEN 19-9: CA 19-9: 20 U/mL (ref 0–35)

## 2021-09-11 LAB — BASIC METABOLIC PANEL
Anion gap: 8 (ref 5–15)
BUN: 12 mg/dL (ref 8–23)
CO2: 27 mmol/L (ref 22–32)
Calcium: 7.9 mg/dL — ABNORMAL LOW (ref 8.9–10.3)
Chloride: 98 mmol/L (ref 98–111)
Creatinine, Ser: 0.63 mg/dL (ref 0.44–1.00)
GFR, Estimated: >60 mL/min (ref >60)
Glucose, Bld: 91 mg/dL (ref 70–99)
Potassium: 3.8 mmol/L (ref 3.5–5.1)
Sodium: 133 mmol/L — ABNORMAL LOW (ref 135–145)

## 2021-09-11 LAB — BRAIN NATRIURETIC PEPTIDE: B Natriuretic Peptide: 336.5 pg/mL — ABNORMAL HIGH (ref 0.0–100.0)

## 2021-09-11 MED ORDER — ADULT MULTIVITAMIN W/MINERALS CH
1.0000 | ORAL_TABLET | Freq: Every day | ORAL | Status: DC
  Administered 2021-09-11 – 2021-09-13 (×3): 1 via ORAL
  Filled 2021-09-11 (×3): qty 1

## 2021-09-11 MED ORDER — BOOST / RESOURCE BREEZE PO LIQD CUSTOM
1.0000 | Freq: Two times a day (BID) | ORAL | Status: DC
  Administered 2021-09-11 – 2021-09-13 (×4): 1 via ORAL

## 2021-09-11 MED ORDER — FUROSEMIDE 10 MG/ML IJ SOLN
40.0000 mg | Freq: Two times a day (BID) | INTRAMUSCULAR | Status: AC
Start: 2021-09-11 — End: 2021-09-11
  Administered 2021-09-11 (×2): 40 mg via INTRAVENOUS
  Filled 2021-09-11 (×2): qty 4

## 2021-09-11 MED ORDER — APIXABAN 5 MG PO TABS
5.0000 mg | ORAL_TABLET | Freq: Two times a day (BID) | ORAL | Status: DC
  Administered 2021-09-11 – 2021-09-13 (×5): 5 mg via ORAL
  Filled 2021-09-11 (×5): qty 1

## 2021-09-11 MED ORDER — ENSURE ENLIVE PO LIQD
237.0000 mL | Freq: Two times a day (BID) | ORAL | Status: DC
  Administered 2021-09-11: 237 mL via ORAL

## 2021-09-11 NOTE — Progress Notes (Signed)
ANTICOAGULATION CONSULT NOTE - Initial Consult  Pharmacy Consult for Apixaban Indication: atrial fibrillation  Allergies  Allergen Reactions   Alka-Seltzer [Aspirin Effervescent] Anaphylaxis and Hives   Alpha-Gal Anaphylaxis    NO MEAT, meat derived products   Beef-Derived Products Anaphylaxis   Nsaids Anaphylaxis and Hives   Other Anaphylaxis and Rash    Alpha-gal allergy, NO MEAT   Pork-Derived Products Anaphylaxis   Codeine Hives and Nausea And Vomiting    Patient Measurements: Height: '5\' 5"'$  (165.1 cm) Weight: 80.3 kg (177 lb 0.5 oz) IBW/kg (Calculated) : 57  Vital Signs: Temp: 97.6 F (36.4 C) (08/14 0416) Temp Source: Oral (08/14 0416) BP: 138/78 (08/14 0846) Pulse Rate: 89 (08/14 0416)  Labs: Recent Labs    09/09/21 0500 09/10/21 0541 09/11/21 0432  HGB 8.5* 9.0* 9.1*  HCT 26.1* 27.7* 27.9*  PLT 267 302 288  CREATININE 0.77 0.66 0.63    Estimated Creatinine Clearance: 53.8 mL/min (by C-G formula based on SCr of 0.63 mg/dL).   Medical History: Past Medical History:  Diagnosis Date   Anxiety    Arthritis    hands   Carpal tunnel syndrome    Chronic a-fib ([place]) 12/09/2018   Colon polyps    Diverticulosis    FUO (fever of unknown origin) 10/31/2018   GERD (gastroesophageal reflux disease)    Hiatal hernia    Hypertension    Macular degeneration    Peripheral neuropathy     Assessment: 71 y/oF with PMH of atrial fibrillation on Apixaban PTA admitted for acute encephalopathy and acute sigmoid diverticulitis. Apixaban held on 8/4 for BRBPR and GI consulted. Bleeding resolved and Hgb stable. Pharmacy consulted to resume PTA regimen of Apixaban. SCr 0.63. Hgb 9.1, Pltc WNL.    Plan:  Resume Apixaban '5mg'$  PO BID Monitor CBC and for s/sx of further bleeding Pharmacy to sign off, but will monitor peripherally for any changes.    Lindell Spar, PharmD, BCPS Clinical Pharmacist  09/11/2021,8:47 AM

## 2021-09-11 NOTE — Plan of Care (Signed)
  Problem: Education: Goal: Knowledge of General Education information will improve Description: Including pain rating scale, medication(s)/side effects and non-pharmacologic comfort measures Outcome: Progressing   Problem: Nutrition: Goal: Adequate nutrition will be maintained Outcome: Progressing   Problem: Safety: Goal: Ability to remain free from injury will improve Outcome: Progressing   

## 2021-09-11 NOTE — Progress Notes (Signed)
Physical Therapy Treatment Patient Details Name: Caitlin Chavez MRN: [id number] DOB: [birth year] Today's Date: 09/11/2021   History of Present Illness 87 year old female history of chronic A-fib on Eliquis, hypertension, chronic UTIs presents the ER today with confusion and weakness. She has been getting injections for neck pain recently. Sons report significant decline in her function in the last month. patient was transitioned to stepdown unit on 8/6 with sepsis protocol initated.    PT Comments    General Comments: AxO x2 following all commands but feels "weak".  Assisted OOB was difficult and required increased time.  General bed mobility comments: required HOB elevated, use of rail as well as bed pad to complete scooting to EOB.  Very Weak.  Poor forward flex posture with c/o neck/back pain. General transfer comment: 50% VC's on proper hand placement as well as turn completion prior to sit.  [place]is[place] on/off toilet was difficult as pt grew more fatigue with each activity.  Had to to call for + 2 assist to get her out of bathroom. General Gait Details: very limited distance to bathroom due to increased c/o fatigue.  Had to call for + 2 assist to get pt out of bathroom.  B knees buckling and poor forward posture.  Son present during session.  Pt will need ST Rehab at SNF to address mobility and functional decline prior to safely returning home.   Recommendations for follow up therapy are one component of a multi-disciplinary discharge planning process, led by the attending physician.  Recommendations may be updated based on patient status, additional functional criteria and insurance authorization.  Follow Up Recommendations  Skilled nursing-short term rehab (<3 hours/day)     Assistance Recommended at Discharge Frequent or constant Supervision/Assistance  Patient can return home with the following Two people to help with walking and/or transfers   Equipment Recommendations  None  recommended by PT    Recommendations for Other Services       Precautions / Restrictions Precautions Precautions: Fall Restrictions Weight Bearing Restrictions: No     Mobility  Bed Mobility Overal bed mobility: Needs Assistance Bed Mobility: Supine to Sit     Supine to sit: Max assist, Total assist     General bed mobility comments: required HOB elevated, use of rail as well as bed pad to complete scooting to EOB.  Very Weak.  Poor forward flex posture with c/o neck/back pain.    Transfers Overall transfer level: Needs assistance Equipment used: Rolling walker (2 wheels) Transfers: Sit to/from Stand Sit to Stand: Mod assist           General transfer comment: 50% VC's on proper hand placement as well as turn completion prior to sit.  [place]is[place] on/off toilet was difficult as pt grew more fatigue with each activity.  Had to to call for + 2 assist to get her out of bathroom.    Ambulation/Gait Ambulation/Gait assistance: +2 physical assistance, +2 safety/equipment, Max assist Gait Distance (Feet): 12 Feet Assistive device: Rolling walker (2 wheels) Gait Pattern/deviations: Step-to pattern, Trunk flexed, Shuffle Gait velocity: decreased     General Gait Details: very limited distance to bathroom due to increased c/o fatigue.  Had to call for + 2 assist to get pt out of bathroom.  B knees buckling and poor forward posture.   Stairs             Wheelchair Mobility    Modified Rankin (Stroke Patients Only)       Balance  Cognition Arousal/Alertness: Awake/alert Behavior During Therapy: WFL for tasks assessed/performed Overall Cognitive Status: Within Functional Limits for tasks assessed                                 General Comments: AxO x2 following all commands but feels "weak".        Exercises      General Comments        Pertinent Vitals/Pain Pain  Assessment Pain Assessment: Faces Faces Pain Scale: Hurts a little bit Pain Location: neck, back and perineal area Pain Descriptors / Indicators: Discomfort, Grimacing Pain Intervention(s): Monitored during session, Repositioned    Home Living                          Prior Function            PT Goals (current goals can now be found in the care plan section) Progress towards PT goals: Progressing toward goals    Frequency    Min 2X/week      PT Plan Current plan remains appropriate    Co-evaluation              AM-PAC PT "6 Clicks" Mobility   Outcome Measure  Help needed turning from your back to your side while in a flat bed without using bedrails?: A Lot Help needed moving from lying on your back to sitting on the side of a flat bed without using bedrails?: A Lot Help needed moving to and from a bed to a chair (including a wheelchair)?: A Lot Help needed standing up from a chair using your arms (e.g., wheelchair or bedside chair)?: A Lot Help needed to walk in hospital room?: A Lot Help needed climbing 3-5 steps with a railing? : Total 6 Click Score: 11    End of Session Equipment Utilized During Treatment: Gait belt Activity Tolerance: Patient limited by fatigue Patient left: in chair;with chair alarm set;with call bell/phone within reach;with family/visitor present Nurse Communication: Mobility status PT Visit Diagnosis: Muscle weakness (generalized) (M62.81);Pain;Difficulty in walking, not elsewhere classified (R26.2)     Time: 6269-4854 PT Time Calculation (min) (ACUTE ONLY): 26 min  Charges:  $Gait Training: 8-22 mins $Therapeutic Activity: 8-22 mins                     {Samual Beals  PTA Acute  Rehabilitation Services Office M-F          [phone] Weekend pager [phone]

## 2021-09-11 NOTE — Progress Notes (Addendum)
   09/11/21 1539  Assess: MEWS Score  Temp 97.7 F (36.5 C)  BP 109/73  MAP (mmHg) 84  Pulse Rate (!) 121  Resp 18  Level of Consciousness Alert  Assess: MEWS Score  MEWS Temp 0  MEWS Systolic 0  MEWS Pulse 2  MEWS RR 0  MEWS LOC 0  MEWS Score 2  MEWS Score Color Yellow  Assess: if the MEWS score is Yellow or Red  Were vital signs taken at a resting state? Yes  Focused Assessment No change from prior assessment  Does the patient meet 2 or more of the SIRS criteria? Yes  Does the patient have a confirmed or suspected source of infection? Yes  Provider and Rapid Response Notified? No  MEWS guidelines implemented *See Row Information* Yes  Treat  MEWS Interventions Administered prn meds/treatments (Metoprolol '5mg'$ )  Take Vital Signs  Increase Vital Sign Frequency  Yellow: Q 2hr X 2 then Q 4hr X 2, if remains yellow, continue Q 4hrs  Escalate  MEWS: Escalate Yellow: discuss with charge nurse/RN and consider discussing with provider and RRT  Notify: Charge Nurse/RN  Name of Charge Nurse/RN Notified Rocco Pauls RN  Date Charge Nurse/RN Notified 09/11/21  Time Charge Nurse/RN Notified 1539  Notify: Provider  Provider Name/Title Adah Perl MD  Date Provider Notified 09/11/21  Time Provider Notified 1539  Notification Reason Other (Comment) (Increase HR)  Provider response Other (Comment) (PRN administered)  Date of Provider Response 09/11/21  Assess: SIRS CRITERIA  SIRS Temperature  0  SIRS Pulse 1  SIRS Respirations  0  SIRS WBC 1  SIRS Score Sum  2

## 2021-09-11 NOTE — TOC Progression Note (Signed)
Transition of Care [facility]) - Progression Note    Patient Details  Name: Caitlin Chavez MRN: [id number] Date of Birth: [birth year]  Transition of [facility]) CM/SW Contact  Caitlin Cha, RN Phone Number: 09/11/2021, 11:05 AM  Clinical Narrative:    List of snf's making bed offers given to the son Caitlin Chavez.  Will review with patient and the family and call me back at [phone].   Expected Discharge Plan:  (TBD) Barriers to Discharge: Continued Medical Work up  Expected Discharge Plan and Services Expected Discharge Plan:  (TBD)   Discharge Planning Services: CM Consult Post Acute Care Choice: [place] Living arrangements for the past 2 months: Single Family Home                                       Social Determinants of Health (SDOH) Interventions    Readmission Risk Interventions     No data to display

## 2021-09-11 NOTE — Progress Notes (Signed)
Nutrition Follow-up  DOCUMENTATION CODES:   Not applicable  INTERVENTION:  - will decrease Boost Breeze from TID to BID, each supplement provides 250 kcal and 9 grams of protein.  - will d/c [employer] [place].  - will order Ensure Plus High Protein BID, each supplement provides 350 kcal and 20 grams of protein.  - will order 1 tablet multivitamin with minerals/day.  - added smart phrase to AVS to encourage oral nutrition supplement (such as Ensure Complete) BID for at least a month after d/c and High Calorie, High Protein handout to AVS to aid in maximizing oral intake after d/c.    NUTRITION DIAGNOSIS:   Increased nutrient needs related to acute illness as evidenced by estimated needs.  GOAL:   Patient will meet greater than or equal to 90% of their needs  MONITOR:   PO intake, Supplement acceptance, Labs, Weight trends, I & O's  REASON FOR ASSESSMENT:   Malnutrition Screening Tool  ASSESSMENT:   87 y.o. patient with H/o chronic A-fib on Eliquis, hypertension, chronic UTIs presents the ER today with confusion and weakness.  Diet advanced from FLD to Soft on 8/7 at 1230, downgraded to CLD on 8/10 at 1135, and re-advanced to Soft on 8/11 at 1157. Documented intakes from lunch on 8/8 through breakfast on 8/13 has been 0-35%.   Patient laying in bed with son at beside; son stepped out shortly after RD arrived for visit d/t needing to talk with TOC. Notes indicate ongoing needs concerning d/c plan.  Patient and son share that patient has had a decreased appetite since PTA and that intakes were often minimal at home. For breakfast today she ate most of a glazed [employer] donut and a few bites of scrambled egg. She reports abdominal pain is beginning to subside. She has had pain in extremities during admission which is new though she does report hx of neuropathy.   Review of orders indicates she has been accepting Boost Breeze 50% of the time offered since supplement was ordered on  8/11 and that she has been accepting [employer] 75% of the time offered since order placed on 8/6.  Patient and son share that patient drinks Ensure at home with intakes are poor and patient is agreeable to this supplement.   Patient and son both deny nutrition-related questions, concerns, or needs at this time beyond providing patient with foods that she requests. She is requesting a sandwich from [employer] for lunch.   Weight on 8/3 documented as 148 lb and weight on 8/9 as 177 lb. She has not been weighed since 8/9.   Labs reviewed; Na: 133 mmol/l, Ca: 7.9 mg/dl.  Medications reviewed; 40 mg IV lasix BID, 2 mg imodium BID, 3 mg melatonin/night, 40 mg oral protonix/day.    NUTRITION - FOCUSED PHYSICAL EXAM:  Flowsheet Row Most Recent Value  Orbital Region No depletion  Upper Arm Region No depletion  Thoracic and Lumbar Region Unable to assess  Buccal Region No depletion  Temple Region No depletion  Clavicle Bone Region No depletion  Clavicle and Acromion Bone Region No depletion  Scapular Bone Region Unable to assess  Dorsal Hand No depletion  Patellar Region No depletion  Anterior Thigh Region No depletion  Posterior Calf Region No depletion  Edema (RD Assessment) Mild  [BLE]  Hair Reviewed  Eyes Reviewed  Mouth Reviewed  Skin Reviewed  Nails Unable to assess  [nail polish]       Diet Order:   Diet Order  DIET SOFT Room service appropriate? Yes; Fluid consistency: Thin  Diet effective now                   EDUCATION NEEDS:   Education needs have been addressed  Skin:  Skin Assessment: Reviewed RN Assessment  Last BM:  8/12 (type 6 x2, one medium and one large amount)  Height:   Ht Readings from Last 1 Encounters:  09/06/21 '5\' 5"'$  (1.651 m)    Weight:   Wt Readings from Last 1 Encounters:  09/06/21 80.3 kg     BMI:  Body mass index is 29.46 kg/m.  Estimated Nutritional Needs:  Kcal:  1700-1900 Protein:  80-90g Fluid:   1.9L/day     Jarome Matin, MS, RD, LDN, CNSC Registered Dietitian II Inpatient Clinical Nutrition RD pager # and on-call/weekend pager # available in [place]

## 2021-09-11 NOTE — Progress Notes (Signed)
PROGRESS NOTE    Caitlin Chavez  ZOX:[id number] DOB: 1934/07/29 DOA: 08/31/2021 PCP: Sueanne Margarita, DO   Brief Narrative:   87 year old female with history of chronic A-fib on Eliquis, hypertension, chronic UTIs presents the ER today with confusion and weakness.  Patient has recent history of UTI secondary to E. coli that was treated with multiple antibiotics, as well as, steroid treatment for neck pain.  Patient was admitted for acute encephalopathy.  Hospital course has been complicated by sepsis, abdominal pain, diarrhea stools and electrolyte abnormalities.  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  GI team consulted.  Due to concerns about lower extremity, ischemic evaluation including ABI and CTA bilateral aorta runoff was unremarkable.   Assessment & Plan:  Principal Problem:   Acute metabolic encephalopathy Active Problems:   Generalized weakness   Essential hypertension   Chronic a-fib (HCC)   Sigmoid diverticulitis   Chronic anticoagulation - on eliquis for afib   Cervical pain (neck)   Hypokalemia   GI bleed   Pancreatic mass    Acute metabolic encephalopathy, presents on admission  Acute sigmoid diverticulitis -Work-up has revealed acute sigmoid diverticulitis, repeat CT showed persistent sigmoid inflammation.  GI recommending total 14 days of antibiotics, on IV Zosyn for now but eventually when she feels better this can be transitioned to Augmentin.   Sepsis developed on 8/5 pm Sepsis physiology has resolved  Anasarca - Dependent edema in lower extremity, thigh and lower abdomen.  Renal function is stable.  We will give 40 mg IV Lasix X2 doses today.  Advised to mobilize as much as possible   Pancreatic mass, 2.1 cm cystic lesion -Seen by GI, lesion was present in the past.  Poor surgical candidate but CA 19-9 ordered, currently results are pending   Left Leg Coolness -ABI unremarkable.   CTA of bilateral aorta runoff has been ordered.   Acute blood loss anemia - This has stabilized, likely from diverticulosis.  Okay to resume Eliquis per GI   Hyponatremia: -Stable around 133.  Hypokalemia/hypomagnesemia/phosphatemia -Replete as needed    Essential HTN,  -On Lopressor twice daily   PAF,  -On Lopressor.  Okay to resume Eliquis     Cervical pain (neck) -CT c spine:  No acute fracture, does has  Multilevel degenerative changes.- -Pain control.  Follow-up outpatient- [facility].  On gabapentin twice daily.    Adult failure to thrive - Encourage oral intake.  PT/OT is recommending SNF.   Gaseous distention of the abdomen: - early ambulation.   Acute urinary retention Foley placed, will need outpatient urology follow-up.  Flomax   Hypoalbuminemia  albumin given by previous provider.  Need to increase nutritional intake   DVT prophylaxis: SCDs Start: 09/01/21 0029 Code Status: DNR Family Communication:Son at bedside   Still has abd pain, but will ask toc for start working on placement.    Nutritional status    Signs/Symptoms: estimated needs  Interventions: Refer to RD note for recommendations  Body mass index is 29.46 kg/m.         Subjective: Doing ok sitting up in the chair. Son is at the bedside   Examination: Constitutional: Alert frail, weak Respiratory: Bi basilar crackles Cardiovascular: Normal sinus rhythm, no rubs Abdomen: Abdomen is slightly tender to deep palpation, not distended Musculoskeletal: 2+ bilateral lower extremity pitting edema Skin: No rashes seen Neurologic: CN 2-12 grossly intact.  And nonfocal Psychiatric: Normal judgment and insight. Alert and  oriented x 3. Normal mood.   Foley catheter in place Objective: Vitals:   09/10/21 0453 09/10/21 1227 09/10/21 2018 09/11/21 0416  BP: 127/70 (!) 140/72 (!) 155/85 134/79  Pulse: (!) 103 98 84 89  Resp: '19 18 18 20  '$ Temp: 98.8 F (37.1 C) (!) 97.5 F (36.4  C) 98 F (36.7 C) 97.6 F (36.4 C)  TempSrc: Oral Oral Oral Oral  SpO2: 98% 96% 97% 96%  Weight:      Height:        Intake/Output Summary (Last 24 hours) at 09/11/2021 0839 Last data filed at 09/11/2021 0445 Gross per 24 hour  Intake 170 ml  Output 2550 ml  Net -2380 ml   Filed Weights   08/31/21 1458 09/06/21 0648  Weight: 67.1 kg 80.3 kg     Data Reviewed:   CBC: Recent Labs  Lab 09/07/21 0444 09/09/21 0500 09/10/21 0541 09/11/21 0432  WBC 8.6 7.0 10.1 8.4  NEUTROABS 6.2 4.3 7.8*  --   HGB 8.2* 8.5* 9.0* 9.1*  HCT 24.8* 26.1* 27.7* 27.9*  MCV 94.3 94.9 94.2 94.9  PLT 230 267 302 664   Basic Metabolic Panel: Recent Labs  Lab 09/05/21 0223 09/06/21 0252 09/07/21 0444 09/08/21 0547 09/09/21 0500 09/10/21 0541 09/11/21 0432  NA 128* 129* 130* 133* 134* 133* 133*  K 3.1* 3.9 3.7 4.2 4.2 4.1 3.8  CL 95* 98 100 102 102 100 98  CO2 23 22 21* '24 25 24 27  '$ GLUCOSE 99 93 86 92 89 91 91  BUN '14 15 12 12 11 12 12  '$ CREATININE 0.73 0.83 0.78 0.74 0.77 0.66 0.63  CALCIUM 7.8* 7.6* 7.7* 7.8* 8.0* 7.9* 7.9*  MG 1.7  --  1.7  --  2.0 1.8 1.8  PHOS 2.1* 2.4* 3.1 3.0 3.4 3.2  --    GFR: Estimated Creatinine Clearance: 53.8 mL/min (by C-G formula based on SCr of 0.63 mg/dL). Liver Function Tests: Recent Labs  Lab 09/06/21 0252 09/07/21 0444 09/08/21 0547 09/09/21 0500 09/10/21 0541  AST  --   --   --  12* 20  ALT  --   --   --  14 20  ALKPHOS  --   --   --  50 54  BILITOT  --   --   --  0.7 0.6  PROT  --   --   --  5.1* 5.1*  ALBUMIN 2.1* 1.8* 2.0* 1.8* 1.8*   No results for input(s): "LIPASE", "AMYLASE" in the last 168 hours. No results for input(s): "AMMONIA" in the last 168 hours. Coagulation Profile: No results for input(s): "INR", "PROTIME" in the last 168 hours. Cardiac Enzymes: No results for input(s): "CKTOTAL", "CKMB", "CKMBINDEX", "TROPONINI" in the last 168 hours. BNP (last 3 results) No results for input(s): "PROBNP" in the last 8760  hours. HbA1C: No results for input(s): "HGBA1C" in the last 72 hours. CBG: No results for input(s): "GLUCAP" in the last 168 hours. Lipid Profile: No results for input(s): "CHOL", "HDL", "LDLCALC", "TRIG", "CHOLHDL", "LDLDIRECT" in the last 72 hours. Thyroid Function Tests: No results for input(s): "TSH", "T4TOTAL", "FREET4", "T3FREE", "THYROIDAB" in the last 72 hours. Anemia Panel: No results for input(s): "VITAMINB12", "FOLATE", "FERRITIN", "TIBC", "IRON", "RETICCTPCT" in the last 72 hours. Sepsis Labs: No results for input(s): "PROCALCITON", "LATICACIDVEN" in the last 168 hours.  Recent Results (from the past 240 hour(s))  Culture, blood (Routine X 2) w Reflex to ID Panel     Status: None  Collection Time: 09/02/21  6:47 PM   Specimen: BLOOD  Result Value Ref Range Status   Specimen Description   Final    BLOOD RIGHT ANTECUBITAL Performed at [facility] [address], [place], [place] [address]    Special Requests   Final    BOTTLES DRAWN AEROBIC AND ANAEROBIC Blood Culture adequate volume Performed at [facility] [address], [place], [place] [address]    Culture   Final    NO GROWTH 5 DAYS Performed at [facility], [place] [address] [place], [place] [address]    Report Status 09/07/2021 FINAL  Final  Culture, blood (Routine X 2) w Reflex to ID Panel     Status: None   Collection Time: 09/02/21  6:50 PM   Specimen: BLOOD  Result Value Ref Range Status   Specimen Description   Final    BLOOD BLOOD LEFT HAND Performed at [facility] [address], [place], [place] [address]    Special Requests   Final    BOTTLES DRAWN AEROBIC AND ANAEROBIC Blood Culture adequate volume Performed at [facility] [address] [place], [place] [address]    Culture   Final    NO GROWTH 5 DAYS Performed at [facility], [place] [address], [place], [place] [address]    Report Status  09/07/2021 FINAL  Final  MRSA Next Gen by PCR, Nasal     Status: None   Collection Time: 09/02/21  8:17 PM   Specimen: Nasal Mucosa; Nasal Swab  Result Value Ref Range Status   MRSA by PCR Next Gen NOT DETECTED NOT DETECTED Final    Comment: (NOTE) The GeneXpert MRSA Assay (FDA approved for NASAL specimens only), is one component of a comprehensive MRSA colonization surveillance program. It is not intended to diagnose MRSA infection nor to guide or monitor treatment for MRSA infections. Test performance is not FDA approved in patients less than 50 years old. Performed at [facility], [place] [address], [place], [place] [address]   C Difficile Quick Screen (NO PCR Reflex)     Status: None   Collection Time: 09/05/21  7:58 AM   Specimen: STOOL  Result Value Ref Range Status   C Diff antigen NEGATIVE NEGATIVE Final   C Diff toxin NEGATIVE NEGATIVE Final   C Diff interpretation No C. difficile detected.  Final    Comment: Performed at [facility], [place] [address], [place],  [address]  Gastrointestinal Panel by PCR , Stool     Status: None   Collection Time: 09/05/21  7:58 AM   Specimen: Stool  Result Value Ref Range Status   Campylobacter species NOT DETECTED NOT DETECTED Final   Plesimonas shigelloides NOT DETECTED NOT DETECTED Final   Salmonella species NOT DETECTED NOT DETECTED Final   Yersinia enterocolitica NOT DETECTED NOT DETECTED Final   Vibrio species NOT DETECTED NOT DETECTED Final   Vibrio cholerae NOT DETECTED NOT DETECTED Final   Enteroaggregative E coli (EAEC) NOT DETECTED NOT DETECTED Final   Enteropathogenic E coli (EPEC) NOT DETECTED NOT DETECTED Final   Enterotoxigenic E coli (ETEC) NOT DETECTED NOT DETECTED Final   Shiga like toxin producing E coli (STEC) NOT DETECTED NOT DETECTED Final   Shigella/Enteroinvasive E coli (EIEC) NOT DETECTED NOT DETECTED Final   Cryptosporidium NOT DETECTED NOT DETECTED Final   Cyclospora  cayetanensis NOT DETECTED NOT DETECTED Final   Entamoeba histolytica NOT  DETECTED NOT DETECTED Final   Giardia lamblia NOT DETECTED NOT DETECTED Final   Adenovirus F40/41 NOT DETECTED NOT DETECTED Final   Astrovirus NOT DETECTED NOT DETECTED Final   Norovirus GI/GII NOT DETECTED NOT DETECTED Final   Rotavirus A NOT DETECTED NOT DETECTED Final   Sapovirus (I, II, IV, and V) NOT DETECTED NOT DETECTED Final    Comment: Performed at [facility], [address] [place], [place] [address]         Radiology Studies: CT ANGIO AO+BIFEM W & OR WO CONTRAST  Result Date: 09/10/2021 CLINICAL DATA:  Claudication or leg ischemia. Weakness. EXAM: CT ANGIOGRAPHY OF ABDOMINAL AORTA WITH ILIOFEMORAL RUNOFF TECHNIQUE: Multidetector CT imaging of the abdomen, pelvis and lower extremities was performed using the standard protocol during bolus administration of intravenous contrast. Multiplanar CT image reconstructions and MIPs were obtained to evaluate the vascular anatomy. RADIATION DOSE REDUCTION: This exam was performed according to the departmental dose-optimization program which includes automated exposure control, adjustment of the mA and/or kV according to patient size and/or use of iterative reconstruction technique. CONTRAST:  169m OMNIPAQUE IOHEXOL 350 MG/ML SOLN COMPARISON:  CT of the abdomen pelvis 09/09/2021. FINDINGS: VASCULAR Aorta: Atherosclerotic changes are present in the aorta. No aneurysm or dissection is present. Celiac: Calcifications are present at the origin without significant stenosis. Branch vessels are within normal limits. SMA: Mild atherosclerotic changes are present the origin without significant stenosis. SMA shares a common origin with the larger trunk of the celiac. Branch vessels are within normal limits. Renals: Calcifications are present at the origins of both renal arteries. No significant stenosis is present. No significant disease is present in the more distal renal  arteries. IMA: Patent without evidence of aneurysm, dissection, vasculitis or significant stenosis. RIGHT Lower Extremity Inflow: Common, internal and external iliac arteries are patent without evidence of aneurysm, dissection, vasculitis or significant stenosis. Outflow: Common, superficial and profunda femoral arteries and the popliteal artery are patent without evidence of aneurysm, dissection, vasculitis or significant stenosis. Runoff: Patent three vessel runoff to the ankle. LEFT Lower Extremity Inflow: Common, internal and external iliac arteries are patent without evidence of aneurysm, dissection, vasculitis or significant stenosis. Outflow: Common, superficial and profunda femoral arteries and the popliteal artery are patent without evidence of aneurysm, dissection, vasculitis or significant stenosis. Runoff: Patent three vessel runoff to the ankle. Veins: No obvious venous abnormality within the limitations of this arterial phase study. Review of the MIP images confirms the above findings. NON-VASCULAR Lower chest: Bilateral pleural effusions again noted. Bibasilar atelectasis stable. Hepatobiliary: No focal liver abnormality is seen. Status post cholecystectomy. No biliary dilatation. Pancreas: Cystic lesion in the pancreatic head is less well seen in arterial phase imaging. Distal pancreas is within normal limits. Spleen: Normal in size without focal abnormality. Adrenals/Urinary Tract: Adrenal glands are unremarkable. Kidneys are normal, without renal calculi, focal lesion, or hydronephrosis. Foley catheter is present within the urinary bladder. Stomach/Bowel: Large hiatal hernia is again seen. Stomach and duodenum are otherwise within normal limits. Small bowel is unremarkable. Terminal ileum is normal. The appendix is visualized and normal. The ascending and transverse colon are within normal limits. Wall thickening again noted in the mid sigmoid colon associated with diverticular change. No free  fluid or free air is associated. Drain is present in the rectum. Lymphatic: No enlarged abdominal or pelvic lymph nodes. Reproductive: Status post hysterectomy. No adnexal masses. Other: A far hernia is stable. Paraumbilical hernia is stable. No significant free fluid or free air  is present. Extensive subcutaneous edema is present over the lower abdomen and bilateral lower extremities. Musculoskeletal: Benign lipoma in noted within the vastus medialis of the left quadriceps, incidental finding. No focal osseous lesions are present. IMPRESSION: 1. No aortic aneurysm or dissection. 2. No significant stenosis of the lower extremity arterial vasculature. 3. Extensive subcutaneous edema over the lower abdomen and bilateral lower extremities consistent with anasarca. 4. Stable large hiatal hernia. 5. Stable appearance of ventral hernias. 6. Stable appearance of right-sided pleural effusions and bibasilar atelectasis. 7. Cystic lesion in the pancreatic head is less well seen in arterial phase imaging. 8. Aortic Atherosclerosis (ICD10-I70.0). Electronically Signed   By: San Morelle M.D.   On: 09/10/2021 13:18   VAS [place] ABI WITH/WO TBI  Result Date: 09/10/2021  LOWER EXTREMITY DOPPLER STUDY Patient Name:  Caitlin Chavez  Date of Exam:   09/09/2021 Medical Rec #: [id number]        Accession #:    [id number] Date of Birth: 1934/07/01        Patient Gender: F Patient Age:   70 years Exam Location:  [facility] Procedure:      VAS [place] ABI WITH/WO TBI Referring Phys: Raiford Noble --------------------------------------------------------------------------------  Indications: Claudication. High Risk Factors: Hypertension.  Comparison Study: No previous exam noted. Performing Technologist: Bobetta Lime BS, RVT  Examination Guidelines: A complete evaluation includes at minimum, Doppler waveform signals and systolic blood pressure reading at the level of bilateral brachial, anterior tibial, and posterior tibial  arteries, when vessel segments are accessible. Bilateral testing is considered an integral part of a complete examination. Photoelectric Plethysmograph (PPG) waveforms and toe systolic pressure readings are included as required and additional duplex testing as needed. Limited examinations for reoccurring indications may be performed as noted.  ABI Findings: +---------+------------------+-----+---------+--------+ Right    Rt Pressure (mmHg)IndexWaveform Comment  +---------+------------------+-----+---------+--------+ Brachial 162                    triphasic         +---------+------------------+-----+---------+--------+ PTA      163               1.01 triphasic         +---------+------------------+-----+---------+--------+ DP       170               1.05 triphasic         +---------+------------------+-----+---------+--------+ Great Toe115               0.71                   +---------+------------------+-----+---------+--------+ +---------+------------------+-----+---------+-------+ Left     Lt Pressure (mmHg)IndexWaveform Comment +---------+------------------+-----+---------+-------+ Brachial 159                    triphasic        +---------+------------------+-----+---------+-------+ PTA      146               0.90 triphasic        +---------+------------------+-----+---------+-------+ DP       169               1.04 triphasic        +---------+------------------+-----+---------+-------+ Great Toe98                0.60                  +---------+------------------+-----+---------+-------+ +-------+-----------+-----------+------------+------------+ ABI/TBIToday's ABIToday's TBIPrevious ABIPrevious TBI +-------+-----------+-----------+------------+------------+ Right  1.05       0.71                                +-------+-----------+-----------+------------+------------+ Left   1.04       0.60                                 +-------+-----------+-----------+------------+------------+  Summary: Right: Resting right ankle-brachial index is within normal range. The right toe-brachial index is normal. Left: Resting left ankle-brachial index is within normal range. The left toe-brachial index is abnormal. *See table(s) above for measurements and observations.  Electronically signed by Orlie Pollen on 09/10/2021 at 10:42:40 AM.    Final    CT ABDOMEN PELVIS W CONTRAST  Result Date: 09/09/2021 CLINICAL DATA:  Follow-up sigmoid diverticulitis. Increased abdominal pain. EXAM: CT ABDOMEN AND PELVIS WITH CONTRAST TECHNIQUE: Multidetector CT imaging of the abdomen and pelvis was performed using the standard protocol following bolus administration of intravenous contrast. RADIATION DOSE REDUCTION: This exam was performed according to the departmental dose-optimization program which includes automated exposure control, adjustment of the mA and/or kV according to patient size and/or use of iterative reconstruction technique. CONTRAST:  149m OMNIPAQUE IOHEXOL 300 MG/ML  SOLN COMPARISON:  09/03/2021 FINDINGS: Lower Chest: Stable mild left basilar atelectasis and small bilateral pleural effusions. Hepatobiliary: No hepatic masses identified. Prior cholecystectomy. No evidence of biliary obstruction. Pancreas: Small cystic lesion in the pancreatic head is seen measuring 2.1 x 1.5 cm. No other pancreatic lesions identified. Evidence of pancreatic ductal dilatation. Spleen: Within normal limits in size and appearance. Adrenals/Urinary Tract: No masses identified. No evidence of ureteral calculi or hydronephrosis. Foley catheter is seen within the urinary bladder. Stomach/Bowel: Large hiatal hernia again noted. Mild sigmoid diverticulitis shows no significant change. Evidence of abscess or extraluminal air. Small amount of free fluid in the right paracolic gutter is noted as well as diffuse body wall edema. Vascular/Lymphatic: No pathologically enlarged  lymph nodes. No acute vascular findings. Aortic atherosclerotic calcification incidentally noted. Reproductive: Prior hysterectomy noted. Adnexal regions are unremarkable in appearance. Other: Stable small paraumbilical ventral hernia, which contains only fat. Musculoskeletal:  No suspicious bone lesions identified. IMPRESSION: Mild sigmoid diverticulitis, without significant change. No evidence of abscess or other complication. Stable large hiatal hernia. Stable small paraumbilical ventral hernia containing only fat. 2.1 cm cystic lesion in pancreatic head. Recommend abdomen MRI without and with contrast for further characterization. Aortic Atherosclerosis (ICD10-I70.0). Electronically Signed   By: JMarlaine HindM.D.   On: 09/09/2021 15:29        Scheduled Meds:  Chlorhexidine Gluconate Cloth  6 each Topical Daily   feeding supplement  1 Container Oral TID BM   feeding supplement (KATE FARMS STANDARD 1.4)  325 mL Oral BID BM   furosemide  40 mg Intravenous BID   gabapentin  100 mg Oral BID   lidocaine  1 patch Transdermal Q24H   loperamide  2 mg Oral BID   melatonin  3 mg Oral QHS   metoprolol tartrate  12.5 mg Oral BID   pantoprazole  40 mg Oral Q0600   tamsulosin  0.4 mg Oral Daily   Continuous Infusions:  piperacillin-tazobactam (ZOSYN)  IV 3.375 g (09/11/21 0833)     LOS: 10 days   Time spent= 35 mins    Starla Deller CArsenio Loader MD [facility]  If 7PM-7AM, please contact night-coverage  09/11/2021, 8:39 AM

## 2021-09-11 NOTE — Care Management Important Message (Signed)
Important Message  Patient Details IM Letter placed in Patients room. Name: Caitlin Chavez MRN: [id number] Date of Birth: 02-23-1934   Medicare Important Message Given:  Yes     Kerin Salen 09/11/2021, 12:55 PM

## 2021-09-11 NOTE — Progress Notes (Addendum)
Patient ID: Caitlin Chavez, female   DOB: [birth year], 87 y.o.   MRN: [id number]    Progress Note   Subjective    Day # 10 CC; sigmoid diverticulitis-persistent abdominal pain, diarrhea  Rectal tube out yesterday IV Zosyn -start 09/03/2021  Repeat CT 09/09/2021-mild sigmoid diverticulitis no significant change no abscess or other complication, stable large hiatal hernia, small periumbilical ventral hernia containing fat, and 2.1 cm cystic lesion in the pancreatic head  WBC 8.4/hemoglobin 9.1/hematocrit 27.9 BNP 336  Patient in bed, son at bedside-not currently having any abdominal pain per patient, tolerating regular diet though has not been eating much-1 small volume diarrheal stool this morning, none through the night.   Objective   Vital signs in last 24 hours: Temp:  [97.5 F (36.4 C)-98 F (36.7 C)] 97.6 F (36.4 C) (08/14 0416) Pulse Rate:  [84-98] 89 (08/14 0416) Resp:  [18-20] 20 (08/14 0416) BP: (134-155)/(72-85) 138/78 (08/14 0846) SpO2:  [96 %-97 %] 96 % (08/14 0416) Last BM Date : 09/10/21 General: Elderly   white female in NAD, affect flat Heart:  Regular rate and rhythm; no murmurs Lungs: Respirations even and unlabored, lungs CTA bilaterally Abdomen:  Soft, there is still some tenderness in the left lower quadrant, mild tenderness across lower abdomen no guarding or rebound, bowel sounds are present, still has a few high-pitched bowel sounds Extremities:  Without edema. Neurologic:  Alert and oriented,  grossly normal neurologically. Psych:  Cooperative.  Affect flat  Intake/Output from previous day: 08/13 0701 - 08/14 0700 In: 170 [P.O.:170] Out: 2550 [Urine:2450; Stool:100] Intake/Output this shift: No intake/output data recorded.  Lab Results: Recent Labs    09/09/21 0500 09/10/21 0541 09/11/21 0432  WBC 7.0 10.1 8.4  HGB 8.5* 9.0* 9.1*  HCT 26.1* 27.7* 27.9*  PLT 267 302 288   BMET Recent Labs    09/09/21 0500 09/10/21 0541 09/11/21 0432   NA 134* 133* 133*  K 4.2 4.1 3.8  CL 102 100 98  CO2 '25 24 27  '$ GLUCOSE 89 91 91  BUN '11 12 12  '$ CREATININE 0.77 0.66 0.63  CALCIUM 8.0* 7.9* 7.9*   LFT Recent Labs    09/10/21 0541  PROT 5.1*  ALBUMIN 1.8*  AST 20  ALT 20  ALKPHOS 54  BILITOT 0.6   PT/INR No results for input(s): "LABPROT", "INR" in the last 72 hours.  Studies/Results: CT ANGIO AO+BIFEM W & OR WO CONTRAST  Result Date: 09/10/2021 CLINICAL DATA:  Claudication or leg ischemia. Weakness. EXAM: CT ANGIOGRAPHY OF ABDOMINAL AORTA WITH ILIOFEMORAL RUNOFF TECHNIQUE: Multidetector CT imaging of the abdomen, pelvis and lower extremities was performed using the standard protocol during bolus administration of intravenous contrast. Multiplanar CT image reconstructions and MIPs were obtained to evaluate the vascular anatomy. RADIATION DOSE REDUCTION: This exam was performed according to the departmental dose-optimization program which includes automated exposure control, adjustment of the mA and/or kV according to patient size and/or use of iterative reconstruction technique. CONTRAST:  135m OMNIPAQUE IOHEXOL 350 MG/ML SOLN COMPARISON:  CT of the abdomen pelvis 09/09/2021. FINDINGS: VASCULAR Aorta: Atherosclerotic changes are present in the aorta. No aneurysm or dissection is present. Celiac: Calcifications are present at the origin without significant stenosis. Branch vessels are within normal limits. SMA: Mild atherosclerotic changes are present the origin without significant stenosis. SMA shares a common origin with the larger trunk of the celiac. Branch vessels are within normal limits. Renals: Calcifications are present at the origins of both renal arteries. No significant  stenosis is present. No significant disease is present in the more distal renal arteries. IMA: Patent without evidence of aneurysm, dissection, vasculitis or significant stenosis. RIGHT Lower Extremity Inflow: Common, internal and external iliac arteries are  patent without evidence of aneurysm, dissection, vasculitis or significant stenosis. Outflow: Common, superficial and profunda femoral arteries and the popliteal artery are patent without evidence of aneurysm, dissection, vasculitis or significant stenosis. Runoff: Patent three vessel runoff to the ankle. LEFT Lower Extremity Inflow: Common, internal and external iliac arteries are patent without evidence of aneurysm, dissection, vasculitis or significant stenosis. Outflow: Common, superficial and profunda femoral arteries and the popliteal artery are patent without evidence of aneurysm, dissection, vasculitis or significant stenosis. Runoff: Patent three vessel runoff to the ankle. Veins: No obvious venous abnormality within the limitations of this arterial phase study. Review of the MIP images confirms the above findings. NON-VASCULAR Lower chest: Bilateral pleural effusions again noted. Bibasilar atelectasis stable. Hepatobiliary: No focal liver abnormality is seen. Status post cholecystectomy. No biliary dilatation. Pancreas: Cystic lesion in the pancreatic head is less well seen in arterial phase imaging. Distal pancreas is within normal limits. Spleen: Normal in size without focal abnormality. Adrenals/Urinary Tract: Adrenal glands are unremarkable. Kidneys are normal, without renal calculi, focal lesion, or hydronephrosis. Foley catheter is present within the urinary bladder. Stomach/Bowel: Large hiatal hernia is again seen. Stomach and duodenum are otherwise within normal limits. Small bowel is unremarkable. Terminal ileum is normal. The appendix is visualized and normal. The ascending and transverse colon are within normal limits. Wall thickening again noted in the mid sigmoid colon associated with diverticular change. No free fluid or free air is associated. Drain is present in the rectum. Lymphatic: No enlarged abdominal or pelvic lymph nodes. Reproductive: Status post hysterectomy. No adnexal masses.  Other: A far hernia is stable. Paraumbilical hernia is stable. No significant free fluid or free air is present. Extensive subcutaneous edema is present over the lower abdomen and bilateral lower extremities. Musculoskeletal: Benign lipoma in noted within the vastus medialis of the left quadriceps, incidental finding. No focal osseous lesions are present. IMPRESSION: 1. No aortic aneurysm or dissection. 2. No significant stenosis of the lower extremity arterial vasculature. 3. Extensive subcutaneous edema over the lower abdomen and bilateral lower extremities consistent with anasarca. 4. Stable large hiatal hernia. 5. Stable appearance of ventral hernias. 6. Stable appearance of right-sided pleural effusions and bibasilar atelectasis. 7. Cystic lesion in the pancreatic head is less well seen in arterial phase imaging. 8. Aortic Atherosclerosis (ICD10-I70.0). Electronically Signed   By: San Morelle M.D.   On: 09/10/2021 13:18   VAS [place] ABI WITH/WO TBI  Result Date: 09/10/2021  LOWER EXTREMITY DOPPLER STUDY Patient Name:  Caitlin Chavez  Date of Exam:   09/09/2021 Medical Rec #: [id number]        Accession #:    [id number] Date of Birth: 1935-01-21        Patient Gender: F Patient Age:   53 years Exam Location:  [facility] Procedure:      VAS [place] ABI WITH/WO TBI Referring Phys: Raiford Noble --------------------------------------------------------------------------------  Indications: Claudication. High Risk Factors: Hypertension.  Comparison Study: No previous exam noted. Performing Technologist: Bobetta Lime BS, RVT  Examination Guidelines: A complete evaluation includes at minimum, Doppler waveform signals and systolic blood pressure reading at the level of bilateral brachial, anterior tibial, and posterior tibial arteries, when vessel segments are accessible. Bilateral testing is considered an integral part of a complete examination. Photoelectric  Plethysmograph (PPG) waveforms and toe systolic  pressure readings are included as required and additional duplex testing as needed. Limited examinations for reoccurring indications may be performed as noted.  ABI Findings: +---------+------------------+-----+---------+--------+ Right    Rt Pressure (mmHg)IndexWaveform Comment  +---------+------------------+-----+---------+--------+ Brachial 162                    triphasic         +---------+------------------+-----+---------+--------+ PTA      163               1.01 triphasic         +---------+------------------+-----+---------+--------+ DP       170               1.05 triphasic         +---------+------------------+-----+---------+--------+ Great Toe115               0.71                   +---------+------------------+-----+---------+--------+ +---------+------------------+-----+---------+-------+ Left     Lt Pressure (mmHg)IndexWaveform Comment +---------+------------------+-----+---------+-------+ Brachial 159                    triphasic        +---------+------------------+-----+---------+-------+ PTA      146               0.90 triphasic        +---------+------------------+-----+---------+-------+ DP       169               1.04 triphasic        +---------+------------------+-----+---------+-------+ Great Toe98                0.60                  +---------+------------------+-----+---------+-------+ +-------+-----------+-----------+------------+------------+ ABI/TBIToday's ABIToday's TBIPrevious ABIPrevious TBI +-------+-----------+-----------+------------+------------+ Right  1.05       0.71                                +-------+-----------+-----------+------------+------------+ Left   1.04       0.60                                +-------+-----------+-----------+------------+------------+  Summary: Right: Resting right ankle-brachial index is within normal range. The right toe-brachial index is normal. Left: Resting  left ankle-brachial index is within normal range. The left toe-brachial index is abnormal. *See table(s) above for measurements and observations.  Electronically signed by Orlie Pollen on 09/10/2021 at 10:42:40 AM.    Final    CT ABDOMEN PELVIS W CONTRAST  Result Date: 09/09/2021 CLINICAL DATA:  Follow-up sigmoid diverticulitis. Increased abdominal pain. EXAM: CT ABDOMEN AND PELVIS WITH CONTRAST TECHNIQUE: Multidetector CT imaging of the abdomen and pelvis was performed using the standard protocol following bolus administration of intravenous contrast. RADIATION DOSE REDUCTION: This exam was performed according to the departmental dose-optimization program which includes automated exposure control, adjustment of the mA and/or kV according to patient size and/or use of iterative reconstruction technique. CONTRAST:  122m OMNIPAQUE IOHEXOL 300 MG/ML  SOLN COMPARISON:  09/03/2021 FINDINGS: Lower Chest: Stable mild left basilar atelectasis and small bilateral pleural effusions. Hepatobiliary: No hepatic masses identified. Prior cholecystectomy. No evidence of biliary obstruction. Pancreas: Small cystic lesion in the pancreatic head is seen measuring 2.1 x 1.5 cm. No other pancreatic  lesions identified. Evidence of pancreatic ductal dilatation. Spleen: Within normal limits in size and appearance. Adrenals/Urinary Tract: No masses identified. No evidence of ureteral calculi or hydronephrosis. Foley catheter is seen within the urinary bladder. Stomach/Bowel: Large hiatal hernia again noted. Mild sigmoid diverticulitis shows no significant change. Evidence of abscess or extraluminal air. Small amount of free fluid in the right paracolic gutter is noted as well as diffuse body wall edema. Vascular/Lymphatic: No pathologically enlarged lymph nodes. No acute vascular findings. Aortic atherosclerotic calcification incidentally noted. Reproductive: Prior hysterectomy noted. Adnexal regions are unremarkable in appearance.  Other: Stable small paraumbilical ventral hernia, which contains only fat. Musculoskeletal:  No suspicious bone lesions identified. IMPRESSION: Mild sigmoid diverticulitis, without significant change. No evidence of abscess or other complication. Stable large hiatal hernia. Stable small paraumbilical ventral hernia containing only fat. 2.1 cm cystic lesion in pancreatic head. Recommend abdomen MRI without and with contrast for further characterization. Aortic Atherosclerosis (ICD10-I70.0). Electronically Signed   By: Marlaine Hind M.D.   On: 09/09/2021 15:29       Assessment / Plan:    #20 87 year old white female who has had a persistent sigmoid diverticulitis-now day 10 hospitalization and IV antibiotics CT on 09/09/2021 showed persistent mild thickening of the sigmoid colon, no abscess or surrounding inflammatory changes  Plan is to continue IV Zosyn for another couple of days as long as she is in hospital, however okay to convert to oral antibiotics/Augmentin soon-with plans to complete at least 14 days total of antibiotics  #2 diarrhea improved-not requiring Flexi-Seal-for now continue Imodium twice daily  #3 high postvoid residual-Foley in place  #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, new since 2020 CA 19-9 ordered, pending Not planning MRI here, if CA 19 9 elevated, can discuss with family  #5 metabolic encephalopathy resolved-affect flat, question mild dementia, question depression  #6 history of atrial fibrillation-on Eliquis chronically-on hold here #7 anasarca #8 failure to thrive/immobility-multifactorial second to age, multiple comorbidities   Principal Problem:   Acute metabolic encephalopathy Active Problems:   Generalized weakness   Essential hypertension   Chronic a-fib (HCC)   Sigmoid diverticulitis   Chronic anticoagulation - on eliquis for afib   Cervical pain (neck)   Hypokalemia   GI bleed   Pancreatic mass     LOS: 10 days   Amy Esterwood PA-C  09/11/2021, 8:54 AM   I have taken a history, reviewed the chart and examined the patient. I performed a substantive portion of this encounter, including complete performance of at least one of the key components, in conjunction with the APP. I agree with the APP's note, impression and recommendations  Patient's abdominal pain and diarrhea improving.  Tolerating soft diet.  Is looking to transfer to SNF.  As above, plan for 14 day course of antibiotics, transition to PO prior to discharge.  No plans for endoscopic examination at this time.    Regarding the pancreatic cyst, the patient states that she would definitely not want to be treated for pancreatic cancer.  Currently she is not a surgery candidate either.  Therefore, the utility of cyst surveillance seems very low.  This can be discussed further as an outpatient, however.  Nechama Escutia E. Candis Schatz, MD [facility]

## 2021-09-12 ENCOUNTER — Inpatient Hospital Stay (HOSPITAL_COMMUNITY): Payer: Medicare Other

## 2021-09-12 DIAGNOSIS — G9341 Metabolic encephalopathy: Secondary | ICD-10-CM | POA: Diagnosis not present

## 2021-09-12 DIAGNOSIS — R935 Abnormal findings on diagnostic imaging of other abdominal regions, including retroperitoneum: Secondary | ICD-10-CM

## 2021-09-12 LAB — BASIC METABOLIC PANEL
Anion gap: 12 (ref 5–15)
BUN: 12 mg/dL (ref 8–23)
CO2: 26 mmol/L (ref 22–32)
Calcium: 7.9 mg/dL — ABNORMAL LOW (ref 8.9–10.3)
Chloride: 95 mmol/L — ABNORMAL LOW (ref 98–111)
Creatinine, Ser: 0.76 mg/dL (ref 0.44–1.00)
GFR, Estimated: >60 mL/min (ref >60)
Glucose, Bld: 111 mg/dL — ABNORMAL HIGH (ref 70–99)
Potassium: 2.7 mmol/L — CL (ref 3.5–5.1)
Sodium: 133 mmol/L — ABNORMAL LOW (ref 135–145)

## 2021-09-12 LAB — CBC
HCT: 28.6 % — ABNORMAL LOW (ref 36.0–46.0)
Hemoglobin: 9.4 g/dL — ABNORMAL LOW (ref 12.0–15.0)
MCH: 31 pg (ref 26.0–34.0)
MCHC: 32.9 g/dL (ref 30.0–36.0)
MCV: 94.4 fL (ref 80.0–100.0)
Platelets: 297 10*3/uL (ref 150–400)
RBC: 3.03 MIL/uL — ABNORMAL LOW (ref 3.87–5.11)
RDW: 14.6 % (ref 11.5–15.5)
WBC: 12.4 10*3/uL — ABNORMAL HIGH (ref 4.0–10.5)
nRBC: 0 % (ref 0.0–0.2)

## 2021-09-12 LAB — MAGNESIUM: Magnesium: 1.6 mg/dL — ABNORMAL LOW (ref 1.7–2.4)

## 2021-09-12 MED ORDER — HYDROCODONE-ACETAMINOPHEN 5-325 MG PO TABS
1.0000 | ORAL_TABLET | Freq: Four times a day (QID) | ORAL | Status: DC
  Administered 2021-09-12 – 2021-09-13 (×4): 1 via ORAL
  Filled 2021-09-12 (×5): qty 1

## 2021-09-12 MED ORDER — MAGNESIUM HYDROXIDE 400 MG/5ML PO SUSP
30.0000 mL | Freq: Once | ORAL | Status: AC
  Administered 2021-09-12: 30 mL via ORAL
  Filled 2021-09-12: qty 30

## 2021-09-12 MED ORDER — MIRTAZAPINE 15 MG PO TABS
7.5000 mg | ORAL_TABLET | Freq: Every day | ORAL | Status: DC
  Administered 2021-09-12: 7.5 mg via ORAL
  Filled 2021-09-12: qty 1

## 2021-09-12 MED ORDER — MAGNESIUM SULFATE 2 GM/50ML IV SOLN
2.0000 g | Freq: Once | INTRAVENOUS | Status: AC
  Administered 2021-09-12: 2 g via INTRAVENOUS
  Filled 2021-09-12: qty 50

## 2021-09-12 MED ORDER — POTASSIUM CHLORIDE 10 MEQ/100ML IV SOLN
10.0000 meq | INTRAVENOUS | Status: AC
  Administered 2021-09-12 (×4): 10 meq via INTRAVENOUS
  Filled 2021-09-12 (×4): qty 100

## 2021-09-12 MED ORDER — MORPHINE SULFATE (PF) 2 MG/ML IV SOLN: 1.0000 mg | INTRAVENOUS | Status: DC | PRN

## 2021-09-12 MED ORDER — POTASSIUM CHLORIDE CRYS ER 20 MEQ PO TBCR
40.0000 meq | EXTENDED_RELEASE_TABLET | ORAL | Status: DC
  Administered 2021-09-12: 40 meq via ORAL
  Filled 2021-09-12 (×2): qty 2

## 2021-09-12 NOTE — Progress Notes (Signed)
Chaplain responded to referral from [employer].  Upon arrival, Cera was peacefully sleeping.  Chaplain was able to meet Lillien's son, Barbaraann Rondo.  Barbaraann Rondo stated that Towanda was having a hard day and that Jess has been conveying how tired she is of being in pain.  He stated that his mom is normally so strong but he's heard her verbiage change around her condition and wanting to go to a rehab.  Chaplain normalized that Edgar has been in a lot of pain and experienced events back to back which can shift someone's perspective.   Barbaraann Rondo voiced the importance of showing up for his mom.  Barbaraann Rondo and his brother rotate to be there for [place].   Chaplain affirmed their love and support as caregivers and sons.   Chaplain provided space to build rapport, community, and spiritual care support.     09/12/21 1300  Clinical Encounter Type  Visited With Patient and family together  Visit Type Initial;Spiritual support

## 2021-09-12 NOTE — Progress Notes (Addendum)
Patient ID: Caitlin Chavez, female   DOB: 05-Mar-1934, 87 y.o.   MRN: [id number]    Progress Note   Subjective   Day # 12  CC; sigmoid diverticulitis-persistent abdominal pain/diarrhea  IV Zosyn day 9  Repeat CT 09/09/2021 mild sigmoid diverticulitis, no significant change no abscess or other complication, small periumbilical ventral hernia containing fat and a 2.1 cm cystic lesion in the pancreatic head  CA 19-9= 20 within normal limits Sodium 133/potassium 2.7/creatinine 0.76 WBC 12.4/hemoglobin 9.4/hematocrit 28.6  Patient not very conversant today, son says she does not look good today and does not seem to be mentating as well.  He is concerned that she may be giving up.  Patient would not voice that directly and just says she does not feel well and is concerned something is not right She has no appetite, did not really eat any breakfast Need abdominal pain only when palpated, no nausea or vomiting, no bowel movement since yesterday.   Objective   Vital signs in last 24 hours: Temp:  [97.6 F (36.4 C)-99.1 F (37.3 C)] 97.6 F (36.4 C) (08/15 0426) Pulse Rate:  [90-121] 106 (08/15 0431) Resp:  [18-20] 18 (08/15 0426) BP: (109-161)/(63-92) 158/89 (08/15 0426) SpO2:  [96 %-98 %] 96 % (08/15 0426) Last BM Date : 09/11/21 General: Elderly white female in NAD Heart:  Regular rate and rhythm; no murmurs Lungs: Respirations even and unlabored, lungs CTA bilaterally Abdomen: Soft, a bit more bloated/distended today.  Bowel sounds are present, remains tender across the lower abdomen  Extremities:  Without edema. Neurologic:  Alert and oriented,  grossly normal neurologically. Psych:  Cooperative.  Affect flat,-she did smile when son said that she had been giving him the evil eye-  Intake/Output from previous day: 08/14 0701 - 08/15 0700 In: 717 [P.O.:717] Out: 1175 [Urine:1175] Intake/Output this shift: No intake/output data recorded.  Lab Results: Recent Labs     09/10/21 0541 09/11/21 0432 09/12/21 0619  WBC 10.1 8.4 12.4*  HGB 9.0* 9.1* 9.4*  HCT 27.7* 27.9* 28.6*  PLT 302 288 297   BMET Recent Labs    09/10/21 0541 09/11/21 0432 09/12/21 0619  NA 133* 133* 133*  K 4.1 3.8 2.7*  CL 100 98 95*  CO2 '24 27 26  '$ GLUCOSE 91 91 111*  BUN '12 12 12  '$ CREATININE 0.66 0.63 0.76  CALCIUM 7.9* 7.9* 7.9*   LFT Recent Labs    09/10/21 0541  PROT 5.1*  ALBUMIN 1.8*  AST 20  ALT 20  ALKPHOS 54  BILITOT 0.6   PT/INR No results for input(s): "LABPROT", "INR" in the last 72 hours.  Studies/Results: CT ANGIO AO+BIFEM W & OR WO CONTRAST  Result Date: 09/10/2021 CLINICAL DATA:  Claudication or leg ischemia. Weakness. EXAM: CT ANGIOGRAPHY OF ABDOMINAL AORTA WITH ILIOFEMORAL RUNOFF TECHNIQUE: Multidetector CT imaging of the abdomen, pelvis and lower extremities was performed using the standard protocol during bolus administration of intravenous contrast. Multiplanar CT image reconstructions and MIPs were obtained to evaluate the vascular anatomy. RADIATION DOSE REDUCTION: This exam was performed according to the departmental dose-optimization program which includes automated exposure control, adjustment of the mA and/or kV according to patient size and/or use of iterative reconstruction technique. CONTRAST:  161m OMNIPAQUE IOHEXOL 350 MG/ML SOLN COMPARISON:  CT of the abdomen pelvis 09/09/2021. FINDINGS: VASCULAR Aorta: Atherosclerotic changes are present in the aorta. No aneurysm or dissection is present. Celiac: Calcifications are present at the origin without significant stenosis. Branch vessels are within normal  limits. SMA: Mild atherosclerotic changes are present the origin without significant stenosis. SMA shares a common origin with the larger trunk of the celiac. Branch vessels are within normal limits. Renals: Calcifications are present at the origins of both renal arteries. No significant stenosis is present. No significant disease is present in  the more distal renal arteries. IMA: Patent without evidence of aneurysm, dissection, vasculitis or significant stenosis. RIGHT Lower Extremity Inflow: Common, internal and external iliac arteries are patent without evidence of aneurysm, dissection, vasculitis or significant stenosis. Outflow: Common, superficial and profunda femoral arteries and the popliteal artery are patent without evidence of aneurysm, dissection, vasculitis or significant stenosis. Runoff: Patent three vessel runoff to the ankle. LEFT Lower Extremity Inflow: Common, internal and external iliac arteries are patent without evidence of aneurysm, dissection, vasculitis or significant stenosis. Outflow: Common, superficial and profunda femoral arteries and the popliteal artery are patent without evidence of aneurysm, dissection, vasculitis or significant stenosis. Runoff: Patent three vessel runoff to the ankle. Veins: No obvious venous abnormality within the limitations of this arterial phase study. Review of the MIP images confirms the above findings. NON-VASCULAR Lower chest: Bilateral pleural effusions again noted. Bibasilar atelectasis stable. Hepatobiliary: No focal liver abnormality is seen. Status post cholecystectomy. No biliary dilatation. Pancreas: Cystic lesion in the pancreatic head is less well seen in arterial phase imaging. Distal pancreas is within normal limits. Spleen: Normal in size without focal abnormality. Adrenals/Urinary Tract: Adrenal glands are unremarkable. Kidneys are normal, without renal calculi, focal lesion, or hydronephrosis. Foley catheter is present within the urinary bladder. Stomach/Bowel: Large hiatal hernia is again seen. Stomach and duodenum are otherwise within normal limits. Small bowel is unremarkable. Terminal ileum is normal. The appendix is visualized and normal. The ascending and transverse colon are within normal limits. Wall thickening again noted in the mid sigmoid colon associated with  diverticular change. No free fluid or free air is associated. Drain is present in the rectum. Lymphatic: No enlarged abdominal or pelvic lymph nodes. Reproductive: Status post hysterectomy. No adnexal masses. Other: A far hernia is stable. Paraumbilical hernia is stable. No significant free fluid or free air is present. Extensive subcutaneous edema is present over the lower abdomen and bilateral lower extremities. Musculoskeletal: Benign lipoma in noted within the vastus medialis of the left quadriceps, incidental finding. No focal osseous lesions are present. IMPRESSION: 1. No aortic aneurysm or dissection. 2. No significant stenosis of the lower extremity arterial vasculature. 3. Extensive subcutaneous edema over the lower abdomen and bilateral lower extremities consistent with anasarca. 4. Stable large hiatal hernia. 5. Stable appearance of ventral hernias. 6. Stable appearance of right-sided pleural effusions and bibasilar atelectasis. 7. Cystic lesion in the pancreatic head is less well seen in arterial phase imaging. 8. Aortic Atherosclerosis (ICD10-I70.0). Electronically Signed   By: San Morelle M.D.   On: 09/10/2021 13:18       Assessment / Plan:    #58 87 year old white female admitted 11 days ago with abdominal pain and diarrhea CT has been consistent with an uncomplicated sigmoid diverticulitis unfortunately has been smoldering and has not resolved no evidence for complication/abscess  Plan was to continue IV Zosyn for another few days while here in the hospital, if to transfer to nursing facility can convert to oral Augmentin to complete a 14-day course  Had been using Imodium twice daily for diarrhea, now not having bowel movements and a bit more distended Stop Imodium and observe  #2 high postvoid residual, Foley in place #3 cystic  lesion of the pancreas measuring 2.3 cm new since 2020 CA 19-9 normal-reassuring Had not planned further work-up here with MRI though this can be  considered if family desires  #4 metabolic encephalopathy-had improved-still not very conversant clear whether this is secondary to metabolic encephalopathy, dementia and her underlying depression -perhaps combination of above  #5 history of atrial fibrillation-back on Eliquis #6 anasarca-diuretics started  #7 failure to thrive-multifactorial family concerned about her lack of progress, they would like to speak to palliative care at least for a goals of care conversation to include the patient.-Will notify hospitalist    Principal Problem:   Acute metabolic encephalopathy Active Problems:   Generalized weakness   Essential hypertension   Chronic a-fib (HCC)   Sigmoid diverticulitis   Chronic anticoagulation - on eliquis for afib   Cervical pain (neck)   Hypokalemia   GI bleed   Pancreatic mass     LOS: 11 days   Caitlin Esterwood PA-C 09/12/2021, 9:04 AM  GI ATTENDING  Interval history data reviewed.  X-rays reviewed.  Case discussed with Dr. Candis Schatz.  Agree with comprehensive interval progress note as outlined above.  Persistent uncomplicated diverticulitis and other issues as outlined.  Continue with multiple supportive measures as outlined above.  Plans for palliative care assistance noted.  We will follow.  Docia Chuck. Geri Seminole., M.D. [facility] Division of Gastroenterology

## 2021-09-12 NOTE — Progress Notes (Signed)
OT Cancellation Note  Patient Details Name: Caitlin Chavez MRN: [id number] DOB: [birth year]   Cancelled Treatment:    Reason Eval/Treat Not Completed: Patient declined, no reason specified Patient was approached this AM with patient refusing to eat breakfast. Nurse made aware and Mable Fill was paged for patient. Patient was approached later this AM with son in room with patient declining to move. Nurse in room at this time. OT to continue to follow and check back as schedule will allow.  Jackelyn Poling OTR/L, [facility] Office# [phone] Pager# [phone] 09/12/2021, 10:36 AM

## 2021-09-12 NOTE — Progress Notes (Signed)
PROGRESS NOTE  Caitlin Chavez  DOB: Jan 22, 1935  PCP: Sueanne Margarita, DO VWU:[id number]  DOA: 08/31/2021  LOS: 11 days  Hospital Day: 13  Brief narrative: Caitlin Chavez is a 87 y.o. female with PMH significant for chronic A-fib on Eliquis, hypertension, chronic UTIs. Patient presented to the ED on 8/3 with complaints of confusion, weakness.  Recently had E. coli UTI treated with multiple antibiotics, as well as, steroid treatment for neck pain.   CT abdomen pelvis showed mild acute diverticulitis involving the distal descending and proximal sigmoid colon.  Admitted to hospitalist service. See below for details.  Subjective: Patient was seen and examined this morning.  Pleasant elderly Caucasian female.  Lying on bed.  Alert, awake, slow to respond but able to answer orientation questions.  Seems to be frustrated with her illness.  1 son was at bedside.  Other son on the phone. Her biggest frustration is uncontrolled pain.  She has a brief bed available but she does not want to go to rehab.  Sons are not in a position to take her home at this time. Last 24 hours, Tmax of 99.1, tachycardic under 110, blood pressure in 150s.  Breathing on room air. Labs with sodium 133, potassium 2.7, magnesium 1.6, WBC 12.4, hemoglobin 9.4  Assessment and plan: Sepsis 2/2 acute sigmoid diverticulitis CT abdomen pelvis on admission showed acute diverticulitis of distal descending and proximal sigmoid colon.  She was started on IV antibiotics despite which she continued to have persistent sigmoid diverticulitis as seen in CT abdomen repeated twice on 8/6 and 8/12.  There was no evidence of abscess or other complication.   On 8/5, she met criteria for sepsis.  Sepsis physiology eventually resolved. GI consultation was obtained.  Currently on a total of 14 days of antibiotics with IV Zosyn.  Will transition to oral Augmentin at discharge.  Generalized pain She has generalized pain mostly in the cervical neck  as well as abdomen.  She is on 0.5 Norco as needed every 4 hours but does not seem to be asking for it.  I scheduled her on 1 mg Norco every 6 hours and also IV morphine 1 mg every 4 hours as needed. Encouraged to cooperate with physical therapy.  Diarrhea Currently on Imodium 2 mg twice daily scheduled.  She is having abdominal distention.  Last BM yesterday.  Imodium was stopped this morning. KUB was obtained which showed mild small and large bowel distention suggestive of ileus.  We will try a dose of milk of magnesia.  Acute metabolic encephalopathy   Likely due to sepsis.  Mental status improved.  Extensive anasarca Hypoalbuminemia Chronic diastolic CHF Moderate elevated pulmonary artery pressure Patient has dependent edema in both lower extremities and abdomen.  8/7, echocardiogram showed EF of 70%, mild LVH, G1 DD, RV size and function normal, moderately elevated pulmonary artery systolic pressure to 55. IV Lasix was given twice yesterday.  More than 1 L output in the last 24 hours.  Creatinine normal and stable.  Lower EXTR edema seems to be improving. 8/12, ABI unremarkable 8/13, CTA bilateral aortic valve did not show any significant stenosis or occlusion. Albumin level consistently less than 2.  May need albumin infusion because of extensive anasarca.  Acute urinary retention Foley catheter in place since 8/10.  Removed today.  Voiding trial ongoing Continue Flomax Urology follow-up  Acute blood loss anemia Hemoglobin stable between 9 and 10. Continue to monitor on Eliquis. Recent Labs    09/02/21 0447  09/02/21 1841 09/07/21 0444 09/09/21 0500 09/10/21 0541 09/11/21 0432 09/12/21 0619  HGB 9.5*   < > 8.2* 8.5* 9.0* 9.1* 9.4*  MCV 93.6   < > 94.3 94.9 94.2 94.9 94.4  VITAMINB12 2,642*  --   --   --   --   --   --   FERRITIN 634*  --   --   --   --   --   --   TIBC 154*  --   --   --   --   --   --   IRON 27*  --   --   --   --   --   --    < > = values in this  interval not displayed.   Mild hyponatremia Sodium level stable between 130 and 135 Recent Labs  Lab 09/06/21 0252 09/07/21 0444 09/08/21 0547 09/09/21 0500 09/10/21 0541 09/11/21 0432 09/12/21 0619  NA 129* 130* 133* 134* 133* 133* 133*      Severe hypokalemia/hypomagnesemia/ Labs from this morning with potassium low at 2.7 and magnesium low at 1.6.  Oral and IV replacement ordered. Recent Labs  Lab 09/06/21 0252 09/07/21 0444 09/08/21 0547 09/09/21 0500 09/10/21 0541 09/11/21 0432 09/12/21 0619  K 3.9 3.7 4.2 4.2 4.1 3.8 2.7*  MG  --  1.7  --  2.0 1.8 1.8 1.6*  PHOS 2.4* 3.1 3.0 3.4 3.2  --   --    Essential HTN On Lopressor twice daily   PAF Rate controlled on on Lopressor.  Continue anticoagulation with Eliquis    Cervical pain (neck) CT c spine:  No acute fracture, does has  Multilevel degenerative changes.- Pain control.  Follow-up outpatient- [facility]. On gabapentin twice daily.   Adult failure to thrive Encourage oral intake.  PT/OT is recommending SNF.  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  CA 19-9 level normal which is reassuring.  Can be considered for an MRI as an outpatient if patient improved overall.  Goals of care   Code Status: DNR  Palliative care consult was obtained few days ago.  At this time, patient is frustrated, depressed and seems to be giving up.  She however does not seem to have all qualifying diagnosis to consider hospice.  We will see how she improves  Mobility: Encourage cooperation with PT  Skin assessment:     Nutritional status:  Body mass index is 29.46 kg/m.  Nutrition Problem: Increased nutrient needs Etiology: acute illness Signs/Symptoms: estimated needs     Diet:  Diet Order             DIET SOFT Room service appropriate? Yes; Fluid consistency: Thin  Diet effective now                   DVT prophylaxis:  SCDs Start: 09/01/21 0029 apixaban (ELIQUIS) tablet 5  mg   Antimicrobials: IV Zosyn Fluid: None Consultants: GI, palliative care Family Communication: Son at bedside  Status is: Inpatient  Continue in-hospital care because: Voiding trial, bowel regimen, pain medicine assessment. Level of care: Progressive   Dispo: The patient is from: Home              Anticipated d/c is to: SNF in 1 to 2 days              Patient currently is not medically stable to d/c.   Difficult to place patient No     Infusions:  piperacillin-tazobactam (ZOSYN)  IV 3.375 g (09/12/21 0816)   potassium chloride      Scheduled Meds:  apixaban  5 mg Oral BID   Chlorhexidine Gluconate Cloth  6 each Topical Daily   feeding supplement  1 Container Oral BID BM   feeding supplement  237 mL Oral BID BM   gabapentin  100 mg Oral BID   HYDROcodone-acetaminophen  1 tablet Oral Q6H   lidocaine  1 patch Transdermal Q24H   magnesium hydroxide  30 mL Oral Once   melatonin  3 mg Oral QHS   metoprolol tartrate  12.5 mg Oral BID   mirtazapine  7.5 mg Oral QHS   multivitamin with minerals  1 tablet Oral Daily   pantoprazole  40 mg Oral Q0600   tamsulosin  0.4 mg Oral Daily    PRN meds: acetaminophen **OR** acetaminophen, guaiFENesin, hydrALAZINE, ipratropium-albuterol, lidocaine, liver oil-zinc oxide, metoprolol tartrate, morphine injection, ondansetron **OR** ondansetron (ZOFRAN) IV, mouth rinse, senna-docusate, traZODone   Antimicrobials: Anti-infectives (From admission, onward)    Start     Dose/Rate Route Frequency Ordered Stop   09/03/21 1600  piperacillin-tazobactam (ZOSYN) IVPB 3.375 g        3.375 g 12.5 mL/hr over 240 Minutes Intravenous Every 8 hours 09/03/21 1003     09/03/21 1100  piperacillin-tazobactam (ZOSYN) IVPB 3.375 g        3.375 g 100 mL/hr over 30 Minutes Intravenous NOW 09/03/21 1000 09/03/21 1034   09/02/21 1830  piperacillin-tazobactam (ZOSYN) IVPB 3.375 g  Status:  Discontinued        3.375 g 12.5 mL/hr over 240 Minutes Intravenous  Every 8 hours 09/02/21 1816 09/03/21 1002   09/01/21 0800  cephALEXin (KEFLEX) capsule 500 mg  Status:  Discontinued        500 mg Oral Every 12 hours 09/01/21 0028 09/02/21 1803   09/01/21 0800  metroNIDAZOLE (FLAGYL) IVPB 500 mg  Status:  Discontinued        500 mg 100 mL/hr over 60 Minutes Intravenous Every 12 hours 09/01/21 0028 09/02/21 1816   08/31/21 1745  vancomycin (VANCOREADY) IVPB 1500 mg/300 mL        1,500 mg 150 mL/hr over 120 Minutes Intravenous  Once 08/31/21 1726 08/31/21 2040   08/31/21 1730  ceFEPIme (MAXIPIME) 2 g in sodium chloride 0.9 % 100 mL IVPB        2 g 200 mL/hr over 30 Minutes Intravenous  Once 08/31/21 1726 08/31/21 1836       Objective: Vitals:   09/12/21 0957 09/12/21 1223  BP: (!) 154/69 (!) 146/73  Pulse: (!) 106 89  Resp: 20 18  Temp: 97.7 F (36.5 C) (!) 97.5 F (36.4 C)  SpO2: 98% 99%    Intake/Output Summary (Last 24 hours) at 09/12/2021 1249 Last data filed at 09/12/2021 1223 Gross per 24 hour  Intake 717 ml  Output 925 ml  Net -208 ml   Filed Weights   08/31/21 1458 09/06/21 0648  Weight: 67.1 kg 80.3 kg   Weight change:  Body mass index is 29.46 kg/m.   Physical Exam: General exam: Pleasant elderly Caucasian female. Skin: No rashes, lesions or ulcers. HEENT: Atraumatic, normocephalic, no obvious bleeding Lungs: Diminished air entry both bases CVS: Regular rate and rhythm, no murmur GI/Abd soft, distended, tympanic, bowel sound present CNS: Alert, awake, oriented x3, slow to respond Psychiatry: Frustrated, tearful Extremities: Trace bilateral pedal edema, right more than left, no calf tenderness  Data Review: I have personally reviewed  the laboratory data and studies available.  F/u labs ordered Unresulted Labs (From admission, onward)     Start     Ordered   09/13/21 0500  CBC with Differential/Platelet  Tomorrow morning,   R       Question:  Specimen collection method  Answer:  Lab=Lab collect   09/12/21 1048    09/11/21 0802  Basic metabolic panel  Daily at 5am,   R     Question:  Specimen collection method  Answer:  Lab=Lab collect   09/10/21 0839   09/11/21 0500  CBC  Daily at 5am,   R     Question:  Specimen collection method  Answer:  Lab=Lab collect   09/10/21 0839   09/11/21 0500  Magnesium  Daily at 5am,   R     Question:  Specimen collection method  Answer:  Lab=Lab collect   09/10/21 2336            Signed, Terrilee Croak, MD [facility] 09/12/2021

## 2021-09-12 NOTE — TOC Progression Note (Addendum)
Transition of Care [facility]) - Progression Note    Patient Details  Name: Caitlin Chavez MRN: [id number] Date of Birth: [birth year]  [facility] CM/SW Contact  Leeroy Cha, RN Phone Number: 09/12/2021, 7:26 AM  Clinical Narrative:    [facility] the son family wants [facility] first then [place] and last [facility]. Text sent to janie holden-pt wants to come to you-request for bed made. 0746-information for auth sent to [facility] via system. 0820-auth for [facility] from [facility] obtained. Tct-janie at [facility] can come today family will need to be at 1 to sign papers. Md notified of bed will make decision to dc or not. Expected Discharge Plan:  (TBD) Barriers to Discharge: Continued Medical Work up  Expected Discharge Plan and Services Expected Discharge Plan:  (TBD)   Discharge Planning Services: CM Consult Post Acute Care Choice: [facility] Living arrangements for the past 2 months: Single Family Home                                       Social Determinants of Health (SDOH) Interventions    Readmission Risk Interventions     No data to display

## 2021-09-12 NOTE — Progress Notes (Signed)
Chaplain responded to page for support for POA documents.  Upon receiving more information from [facility] son, Bonney Roussel recognized they wanted to complete durable POA documents.  Chaplain let him know how to obtain their own notary via a mobile service and the hospital's policy around [place] only completing healthcare POA documents.     09/12/21 1600  Clinical Encounter Type  Visited With Family  Visit Type Social support

## 2021-09-13 DIAGNOSIS — M542 Cervicalgia: Principal | ICD-10-CM

## 2021-09-13 DIAGNOSIS — G9341 Metabolic encephalopathy: Secondary | ICD-10-CM | POA: Diagnosis not present

## 2021-09-13 LAB — CBC WITH DIFFERENTIAL/PLATELET
Abs Immature Granulocytes: 0.27 10*3/uL — ABNORMAL HIGH (ref 0.00–0.07)
Basophils Absolute: 0.1 10*3/uL (ref 0.0–0.1)
Basophils Relative: 1 %
Eosinophils Absolute: 0.2 10*3/uL (ref 0.0–0.5)
Eosinophils Relative: 2 %
HCT: 32.8 % — ABNORMAL LOW (ref 36.0–46.0)
Hemoglobin: 10.4 g/dL — ABNORMAL LOW (ref 12.0–15.0)
Immature Granulocytes: 3 %
Lymphocytes Relative: 13 %
Lymphs Abs: 1.3 10*3/uL (ref 0.7–4.0)
MCH: 31 pg (ref 26.0–34.0)
MCHC: 31.7 g/dL (ref 30.0–36.0)
MCV: 97.6 fL (ref 80.0–100.0)
Monocytes Absolute: 0.6 10*3/uL (ref 0.1–1.0)
Monocytes Relative: 6 %
Neutro Abs: 7.9 10*3/uL — ABNORMAL HIGH (ref 1.7–7.7)
Neutrophils Relative %: 75 %
Platelets: 312 10*3/uL (ref 150–400)
RBC: 3.36 MIL/uL — ABNORMAL LOW (ref 3.87–5.11)
RDW: 14.8 % (ref 11.5–15.5)
WBC: 10.4 10*3/uL (ref 4.0–10.5)
nRBC: 0 % (ref 0.0–0.2)

## 2021-09-13 LAB — BASIC METABOLIC PANEL
Anion gap: 9 (ref 5–15)
BUN: 16 mg/dL (ref 8–23)
CO2: 29 mmol/L (ref 22–32)
Calcium: 8.3 mg/dL — ABNORMAL LOW (ref 8.9–10.3)
Chloride: 98 mmol/L (ref 98–111)
Creatinine, Ser: 0.82 mg/dL (ref 0.44–1.00)
GFR, Estimated: >60 mL/min (ref >60)
Glucose, Bld: 88 mg/dL (ref 70–99)
Potassium: 4.6 mmol/L (ref 3.5–5.1)
Sodium: 136 mmol/L (ref 135–145)

## 2021-09-13 LAB — MAGNESIUM: Magnesium: 2.3 mg/dL (ref 1.7–2.4)

## 2021-09-13 MED ORDER — ALPRAZOLAM 0.5 MG PO TABS: 0.5000 mg | ORAL_TABLET | Freq: Two times a day (BID) | ORAL | 0 refills | Status: AC | PRN

## 2021-09-13 MED ORDER — ENSURE ENLIVE PO LIQD: 237.0000 mL | Freq: Two times a day (BID) | ORAL | 12 refills | Status: AC

## 2021-09-13 MED ORDER — GUAIFENESIN 100 MG/5ML PO LIQD
5.0000 mL | ORAL | 0 refills | Status: AC | PRN
Start: 2021-09-13 — End: ?

## 2021-09-13 MED ORDER — METOPROLOL TARTRATE 25 MG PO TABS: 12.5000 mg | ORAL_TABLET | Freq: Two times a day (BID) | ORAL | Status: AC

## 2021-09-13 MED ORDER — AMOXICILLIN-POT CLAVULANATE 875-125 MG PO TABS: 1.0000 | ORAL_TABLET | Freq: Two times a day (BID) | ORAL | 0 refills | Status: AC

## 2021-09-13 MED ORDER — MELATONIN 3 MG PO TABS
3.0000 mg | ORAL_TABLET | Freq: Every day | ORAL | 0 refills | Status: AC
Start: 2021-09-13 — End: ?

## 2021-09-13 MED ORDER — HYDROCODONE-ACETAMINOPHEN 5-325 MG PO TABS
0.5000 | ORAL_TABLET | Freq: Four times a day (QID) | ORAL | Status: DC
  Administered 2021-09-13: 0.5 via ORAL
  Filled 2021-09-13: qty 1

## 2021-09-13 MED ORDER — SENNOSIDES-DOCUSATE SODIUM 8.6-50 MG PO TABS: 1.0000 | ORAL_TABLET | Freq: Every evening | ORAL | Status: AC | PRN

## 2021-09-13 MED ORDER — IPRATROPIUM-ALBUTEROL 0.5-2.5 (3) MG/3ML IN SOLN: 3.0000 mL | RESPIRATORY_TRACT | Status: AC | PRN

## 2021-09-13 MED ORDER — HYDROCODONE-ACETAMINOPHEN 5-325 MG PO TABS
1.0000 | ORAL_TABLET | Freq: Four times a day (QID) | ORAL | 0 refills | Status: AC
Start: 2021-09-13 — End: 2021-09-16

## 2021-09-13 MED ORDER — TAMSULOSIN HCL 0.4 MG PO CAPS
0.4000 mg | ORAL_CAPSULE | Freq: Every day | ORAL | Status: AC
Start: 2021-09-14 — End: ?

## 2021-09-13 MED ORDER — ACETAMINOPHEN 325 MG PO TABS: 650.0000 mg | ORAL_TABLET | Freq: Four times a day (QID) | ORAL | Status: AC | PRN

## 2021-09-13 MED ORDER — MIRTAZAPINE 7.5 MG PO TABS
7.5000 mg | ORAL_TABLET | Freq: Every day | ORAL | 0 refills | Status: AC
Start: 2021-09-13 — End: 2021-09-16

## 2021-09-13 MED ORDER — GABAPENTIN 100 MG PO CAPS: 100.0000 mg | ORAL_CAPSULE | Freq: Two times a day (BID) | ORAL | Status: AC

## 2021-09-13 NOTE — Discharge Summary (Signed)
Physician Discharge Summary  Caitlin Chavez RFX:[id number] DOB: [birth year] DOA: 08/31/2021  PCP: Sueanne Margarita, DO  Admit date: 08/31/2021 Discharge date: 09/13/2021  Admitted From: Home Discharge disposition: SNF  Recommendations at discharge:  Continue antibiotics and biotics for next 3 days to complete 2 weeks course Continue pain medicines, muscle relaxant as needed Continue bowel regimen with scheduled Senokot and as needed MiraLAX Continue to party.  With physical therapy   Brief narrative: Caitlin Chavez is a 87 y.o. female with PMH significant for chronic A-fib on Eliquis, hypertension, chronic UTIs. Patient presented to the ED on 8/3 with complaints of confusion, weakness.  Recently had E. coli UTI treated with multiple antibiotics, as well as, steroid treatment for neck pain.   CT abdomen pelvis showed mild acute diverticulitis involving the distal descending and proximal sigmoid colon.  Admitted to hospitalist service. See below for details.  Subjective: Patient was seen and examined this morning.  Sitting up in chair.  Not in distress.  Pain controlled.  Earlier she participated with physical therapy.  Still weak but feels better to be out of bed. Able to void after Foley catheter was removed yesterday.  Had a big bowel movement yesterday. Son at bedside.  Agreeable to the plan.  Glucose: Sepsis 2/2 acute sigmoid diverticulitis CT abdomen pelvis on admission showed acute diverticulitis of distal descending and proximal sigmoid colon.  She was started on IV antibiotics despite which she continued to have persistent sigmoid diverticulitis as seen in CT abdomen repeated twice on 8/6 and 8/12.  There was no evidence of abscess or other complication.   On 8/5, she met criteria for sepsis.  Sepsis physiology eventually resolved. GI consultation was obtained.  Currently on a total of 14 days of antibiotics with IV Zosyn.  Will transition to oral Augmentin at discharge for next  3 days to complete 2 weeks course.  Generalized pain She has generalized pain mostly in the cervical neck as well as abdomen.  Pain is gradually improving after she was scheduled on Norco yesterday.  I will discharge her on scheduled Norco, as needed muscle relaxer. Encouraged to cooperate with physical therapy.  Ileus Earlier in the course of hospitalization, patient had diarrhea and hence she was started on Imodium 2 mg twice daily.   8/15, KUB showed mild small and large bowel distention suggestive of ileus.  In the room was held.  1 dose of milk of magnesia was given after which she had a big bowel movement. She will be continue to use pain medicines at rehab.  So I will maintain her on Senokot scheduled as well as MiraLAX as needed.  Acute metabolic encephalopathy   Likely due to sepsis.  Mental status improved.  Extensive anasarca Hypoalbuminemia Chronic diastolic CHF Moderate elevated pulmonary artery pressure Patient has dependent edema in both lower extremities and abdomen.  8/7, echocardiogram showed EF of 70%, mild LVH, G1 DD, RV size and function normal, moderately elevated pulmonary artery systolic pressure to 55. IV Lasix was given to 2 doses.  Creatinine normal and stable.  Lower EXTR edema seems to be improving. 8/12, ABI unremarkable 8/13, CTA bilateral aortic valve did not show any significant stenosis or occlusion. Albumin level consistently less than 2.  Encourage nutrition to improve albumin level  Acute urinary retention Foley catheter in place since 8/10.  Removed on 8/15.  Passed voiding trial continue Flomax  Acute blood loss anemia Hemoglobin stable close to 10. Continue to monitor on Eliquis. Recent Labs  09/02/21 0447 09/02/21 1841 09/09/21 0500 09/10/21 0541 09/11/21 0432 09/12/21 0619 09/13/21 0603  HGB 9.5*   < > 8.5* 9.0* 9.1* 9.4* 10.4*  MCV 93.6   < > 94.9 94.2 94.9 94.4 97.6  VITAMINB12 2,642*  --   --   --   --   --   --   FERRITIN 634*   --   --   --   --   --   --   TIBC 154*  --   --   --   --   --   --   IRON 27*  --   --   --   --   --   --    < > = values in this interval not displayed.   Mild hyponatremia Sodium level stable between 130 and 135.  Improved today to 136. Recent Labs  Lab 09/07/21 0444 09/08/21 0547 09/09/21 0500 09/10/21 0541 09/11/21 0432 09/12/21 0619 09/13/21 0603  NA 130* 133* 134* 133* 133* 133* 136   Recent Labs  Lab 09/07/21 0444 09/08/21 0547 09/09/21 0500 09/10/21 0541 09/11/21 0432 09/12/21 0619 09/13/21 0603  NA 130* 133* 134* 133* 133* 133* 136   Severe hypokalemia/hypomagnesemia/ Levels improved after replacement yesterday. Recent Labs  Lab 09/07/21 0444 09/08/21 0547 09/09/21 0500 09/10/21 0541 09/11/21 0432 09/12/21 0619 09/13/21 0603  K 3.7 4.2 4.2 4.1 3.8 2.7* 4.6  MG 1.7  --  2.0 1.8 1.8 1.6* 2.3  PHOS 3.1 3.0 3.4 3.2  --   --   --    Essential HTN On Lopressor twice daily and HCTZ 12.5 mg daily.   PAF Rate controlled on on Lopressor.  Continue anticoagulation with Eliquis    Cervical pain (neck) CT c spine:  No acute fracture, does has  Multilevel degenerative changes. Continue pain control.  Follow-up outpatient- [facility]. On gabapentin twice daily.   Adult failure to thrive Encourage oral intake.    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  CA 19-9 level normal which is reassuring.  Can be considered for an MRI as an outpatient if patient improved overall.  Wounds:  -    Discharge Exam:   Vitals:   09/12/21 1223 09/12/21 2146 09/13/21 0153 09/13/21 0438  BP: (!) 146/73 136/73 130/80 (!) 143/79  Pulse: 89 (!) 110 98 (!) 101  Resp: _0 Temp: (!) 97.5 F (36.4 C) 98.8 F (37.1 C) (!) 97.5 F (36.4 C) 98.2 F (36.8 C)  TempSrc: Axillary Oral Oral Oral  SpO2: 99% 99% 97% 99%  Weight:      Height:        Body mass index is 29.46 kg/m.   General exam: Pleasant elderly Caucasian  female. Skin: No rashes, lesions or ulcers. HEENT: Atraumatic, normocephalic, no obvious bleeding Lungs: Clear to auscultation bilaterally CVS: Regular rate and rhythm, no murmur GI/Abd soft, improved distention, bowel sound present CNS: Alert, awake, oriented x3, slow to respond Psychiatry: Mood appropriate Extremities: Trace bilateral pedal edema, right more than left, no calf tenderness  Follow ups:    Follow-up Information     Connect with your PCP/Specialist as discussed. Schedule an appointment as soon as possible for a visit .   Contact information: [email or web address] Call our physician referral line at [phone].        Sueanne Margarita, DO Follow up.   Specialty: Internal Medicine Contact information: [address] [place] [address] [phone]  Discharge Instructions:   Discharge Instructions     Call MD for:  difficulty breathing, headache or visual disturbances   Complete by: As directed    Call MD for:  extreme fatigue   Complete by: As directed    Call MD for:  hives   Complete by: As directed    Call MD for:  persistant dizziness or light-headedness   Complete by: As directed    Call MD for:  persistant nausea and vomiting   Complete by: As directed    Call MD for:  severe uncontrolled pain   Complete by: As directed    Call MD for:  temperature >100.4   Complete by: As directed    Diet general   Complete by: As directed    Discharge instructions   Complete by: As directed    Recommendations at discharge:   Continue antibiotics and biotics for next 3 days to complete 2 weeks course  Continue pain medicines, muscle relaxant as needed  Continue bowel regimen with scheduled Senokot and as needed MiraLAX  Continue to party.  With physical therapy  General discharge instructions: Follow with Primary MD Sueanne Margarita, DO in 7 days  Please request your PCP  to go over your hospital tests,  procedures, radiology results at the follow up. Please get your medicines reviewed and adjusted.  Your PCP may decide to repeat certain labs or tests as needed. Do not drive, operate heavy machinery, perform activities at heights, swimming or participation in water activities or provide baby sitting services if your were admitted for syncope or siezures until you have seen by Primary MD or a Neurologist and advised to do so again. [place] Controlled Substance Reporting System database was reviewed. Do not drive, operate heavy machinery, perform activities at heights, swim, participate in water activities or provide baby-sitting services while on medications for pain, sleep and mood until your outpatient physician has reevaluated you and advised to do so again.  You are strongly recommended to comply with the dose, frequency and duration of prescribed medications. Activity: As tolerated with Full fall precautions use walker/cane & assistance as needed Avoid using any recreational substances like cigarette, tobacco, alcohol, or non-prescribed drug. If you experience worsening of your admission symptoms, develop shortness of breath, life threatening emergency, suicidal or homicidal thoughts you must seek medical attention immediately by calling 911 or calling your MD immediately  if symptoms less severe. You must read complete instructions/literature along with all the possible adverse reactions/side effects for all the medicines you take and that have been prescribed to you. Take any new medicine only after you have completely understood and accepted all the possible adverse reactions/side effects.  Wear Seat belts while driving. You were cared for by a hospitalist during your hospital stay. If you have any questions about your discharge medications or the care you received while you were in the hospital after you are discharged, you can call the unit and ask to speak with the hospitalist or the  covering physician. Once you are discharged, your primary care physician will handle any further medical issues. Please note that NO REFILLS for any discharge medications will be authorized once you are discharged, as it is imperative that you return to your primary care physician (or establish a relationship with a primary care physician if you do not have one).   Increase activity slowly   Complete by: As directed        Discharge Medications:   Allergies as  of 09/13/2021       Reactions   Alka-seltzer [aspirin Effervescent] Anaphylaxis, Hives   Alpha-gal Anaphylaxis   NO MEAT, meat derived products   Beef-derived Products Anaphylaxis   Nsaids Anaphylaxis, Hives   Other Anaphylaxis, Rash   Alpha-gal allergy, NO MEAT   Pork-derived Products Anaphylaxis   Codeine Hives, Nausea And Vomiting        Medication List     STOP taking these medications    carvedilol 6.25 MG tablet Commonly known as: COREG   primidone 50 MG tablet Commonly known as: MYSOLINE       TAKE these medications    acetaminophen 325 MG tablet Commonly known as: TYLENOL Take 2 tablets (650 mg total) by mouth every 6 (six) hours as needed for mild pain (or Fever >/= 101).   allopurinol 100 MG tablet Commonly known as: ZYLOPRIM Take 100 mg by mouth 2 (two) times daily.   ALPRAZolam 0.5 MG tablet Commonly known as: XANAX Take 1 tablet (0.5 mg total) by mouth 2 (two) times daily as needed for up to 3 days for anxiety.   amoxicillin-clavulanate 875-125 MG tablet Commonly known as: AUGMENTIN Take 1 tablet by mouth 2 (two) times daily for 3 days.   apixaban 5 MG Tabs tablet Commonly known as: ELIQUIS Take 1 tablet (5 mg total) by mouth 2 (two) times daily.   Biofreeze 4 % Gel Generic drug: Menthol (Topical Analgesic) Apply 1 application  topically daily as needed (For knee pain).   cholecalciferol 25 MCG (1000 UNIT) tablet Commonly known as: VITAMIN D3 Take 2,000 Units by mouth daily.    EPINEPHrine 0.3 mg/0.3 mL Soaj injection Commonly known as: EPI-PEN Inject 0.3 mg into the muscle as needed.   feeding supplement Liqd Take 237 mLs by mouth 2 (two) times daily between meals.   gabapentin 100 MG capsule Commonly known as: NEURONTIN Take 1 capsule (100 mg total) by mouth 2 (two) times daily. What changed:  medication strength how much to take   guaiFENesin 100 MG/5ML liquid Commonly known as: ROBITUSSIN Take 5 mLs by mouth every 4 (four) hours as needed for cough or to loosen phlegm.   hydrochlorothiazide 12.5 MG capsule Commonly known as: MICROZIDE Take 12.5 mg by mouth daily.   HYDROcodone-acetaminophen 5-325 MG tablet Commonly known as: NORCO/VICODIN Take 1 tablet by mouth every 6 (six) hours for 3 days.   ipratropium-albuterol 0.5-2.5 (3) MG/3ML Soln Commonly known as: DUONEB Take 3 mLs by nebulization every 4 (four) hours as needed.   lidocaine 5 % Commonly known as: LIDODERM Place 1 patch onto the skin daily as needed (For neck pain).   melatonin 3 MG Tabs tablet Take 1 tablet (3 mg total) by mouth at bedtime.   metoprolol tartrate 25 MG tablet Commonly known as: LOPRESSOR Take 0.5 tablets (12.5 mg total) by mouth 2 (two) times daily.   mirtazapine 7.5 MG tablet Commonly known as: REMERON Take 1 tablet (7.5 mg total) by mouth at bedtime for 3 days.   OCUVITE PO Take 1 tablet by mouth daily.   omeprazole 40 MG capsule Commonly known as: PRILOSEC Take 40 mg by mouth daily.   Potassium 99 MG Tabs Take 1 tablet by mouth 2 (two) times daily.   PROBIOTIC PO Take 1 tablet by mouth daily.   senna-docusate 8.6-50 MG tablet Commonly known as: Senokot-S Take 1 tablet by mouth at bedtime as needed for moderate constipation.   tamsulosin 0.4 MG Caps capsule Commonly known as: FLOMAX Take 1  capsule (0.4 mg total) by mouth daily. Start taking on: September 14, 2021         The results of significant diagnostics from this hospitalization  (including imaging, microbiology, ancillary and laboratory) are listed below for reference.    Procedures and Diagnostic Studies:   DG Knee 1-2 Views Left  Result Date: 09/01/2021 CLINICAL DATA:  Left knee pain. EXAM: LEFT KNEE - 1-2 VIEW COMPARISON:  None Available. FINDINGS: There is diffuse decreased bone mineralization. Mild medial compartment of the knee joint space narrowing. Tiny superior patellar degenerative osteophyte. Tiny joint effusion. No acute fracture is seen. No dislocation. IMPRESSION: Minimal medial and patellofemoral compartment osteoarthritis. No acute fracture is seen. Electronically Signed   By: Yvonne Kendall M.D.   On: 09/01/2021 15:08   CT CERVICAL SPINE WO CONTRAST  Result Date: 09/01/2021 CLINICAL DATA:  Chronic neck pain. EXAM: CT CERVICAL SPINE WITHOUT CONTRAST TECHNIQUE: Multidetector CT imaging of the cervical spine was performed without intravenous contrast. Multiplanar CT image reconstructions were also generated. RADIATION DOSE REDUCTION: This exam was performed according to the departmental dose-optimization program which includes automated exposure control, adjustment of the mA and/or kV according to patient size and/or use of iterative reconstruction technique. COMPARISON:  None Available. FINDINGS: Alignment: There is mild anterolisthesis at C5-C6. Skull base and vertebrae: No acute fracture. No primary bone lesion or focal pathologic process. Soft tissues and spinal canal: No prevertebral fluid or swelling. No visible canal hematoma. Disc levels: Multilevel intervertebral disc space narrowing, uncovertebral osteophyte formation and facet arthropathy is noted bilaterally resulting in mild spinal canal stenosis. There is mild to moderate neural foraminal narrowing at C3-C4 and C4-C5. Upper chest: Negative. Other: None. IMPRESSION: 1. No acute fracture. 2. Multilevel degenerative changes as detailed above. Electronically Signed   By: Brett Fairy M.D.   On: 09/01/2021  00:04   CT ABDOMEN PELVIS W CONTRAST  Result Date: 08/31/2021 CLINICAL DATA:  Abdominal pain. EXAM: CT ABDOMEN AND PELVIS WITH CONTRAST TECHNIQUE: Multidetector CT imaging of the abdomen and pelvis was performed using the standard protocol following bolus administration of intravenous contrast. RADIATION DOSE REDUCTION: This exam was performed according to the departmental dose-optimization program which includes automated exposure control, adjustment of the mA and/or kV according to patient size and/or use of iterative reconstruction technique. CONTRAST:  120m OMNIPAQUE IOHEXOL 300 MG/ML  SOLN COMPARISON:  October 28, 2018 FINDINGS: Lower chest: Mild atelectasis is seen within the bilateral lung bases. A small pericardial effusion is seen. Hepatobiliary: No focal liver abnormality is seen. Status post cholecystectomy. No biliary dilatation. Pancreas: Unremarkable. No pancreatic ductal dilatation or surrounding inflammatory changes. Spleen: Normal in size without focal abnormality. Adrenals/Urinary Tract: Adrenal glands are unremarkable. Kidneys are normal in size, without renal calculi or hydronephrosis. An 11 mm diameter simple cyst is seen within the anterior aspect of the mid right kidney. No additional follow-up or imaging is recommended. Bladder is unremarkable. Stomach/Bowel: There is a large hiatal hernia. Appendix appears normal. No evidence of bowel dilatation. Noninflamed diverticula are seen throughout the large bowel. Mildly thickened and inflamed distal descending and proximal sigmoid colon is also noted. Vascular/Lymphatic: Aortic atherosclerosis. No enlarged abdominal or pelvic lymph nodes. Reproductive: Status post hysterectomy. No adnexal masses. Other: A 3.3 cm x 2.5 cm fat containing umbilical hernia is seen. No abdominopelvic ascites. Musculoskeletal: Multilevel degenerative changes are seen throughout the lumbar spine. IMPRESSION: 1. Mild acute diverticulitis involving the distal  descending and proximal sigmoid colon. 2. Evidence of prior cholecystectomy and hysterectomy. 3.  Aortic atherosclerosis. Aortic Atherosclerosis (ICD10-I70.0). Electronically Signed   By: Virgina Norfolk M.D.   On: 08/31/2021 20:42   DG Chest Port 1 View  Result Date: 08/31/2021 CLINICAL DATA:  Questionable sepsis. EXAM: PORTABLE CHEST 1 VIEW COMPARISON:  Chest x-ray 12/09/2018.  Chest CT 08/12/2015 FINDINGS: The heart size and mediastinal contours are within normal limits. Both lungs are clear. The visualized skeletal structures are unremarkable. Large hiatal hernia is unchanged. IMPRESSION: No active disease. Electronically Signed   By: Ronney Asters M.D.   On: 08/31/2021 16:57     Labs:   Basic Metabolic Panel: Recent Labs  Lab 09/07/21 0444 09/08/21 0547 09/09/21 0500 09/10/21 0541 09/11/21 0432 09/12/21 0619 09/13/21 0603  NA 130* 133* 134* 133* 133* 133* 136  K 3.7 4.2 4.2 4.1 3.8 2.7* 4.6  CL 100 102 102 100 98 95* 98  CO2 21* _0 GLUCOSE 86 92 89 91 91 111* 88  BUN _1 CREATININE 0.78 0.74 0.77 0.66 0.63 0.76 0.82  CALCIUM 7.7* 7.8* 8.0* 7.9* 7.9* 7.9* 8.3*  MG 1.7  --  2.0 1.8 1.8 1.6* 2.3  PHOS 3.1 3.0 3.4 3.2  --   --   --    GFR Estimated Creatinine Clearance: 52.5 mL/min (by C-G formula based on SCr of 0.82 mg/dL). Liver Function Tests: Recent Labs  Lab 09/07/21 0444 09/08/21 0547 09/09/21 0500 09/10/21 0541  AST  --   --  12* 20  ALT  --   --  14 20  ALKPHOS  --   --  50 54  BILITOT  --   --  0.7 0.6  PROT  --   --  5.1* 5.1*  ALBUMIN 1.8* 2.0* 1.8* 1.8*   No results for input(s): "LIPASE", "AMYLASE" in the last 168 hours. No results for input(s): "AMMONIA" in the last 168 hours. Coagulation profile No results for input(s): "INR", "PROTIME" in the last 168 hours.  CBC: Recent Labs  Lab 09/07/21 0444 09/09/21 0500 09/10/21 0541 09/11/21 0432 09/12/21 0619 09/13/21 0603  WBC 8.6 7.0 10.1 8.4 12.4* 10.4   NEUTROABS 6.2 4.3 7.8*  --   --  7.9*  HGB 8.2* 8.5* 9.0* 9.1* 9.4* 10.4*  HCT 24.8* 26.1* 27.7* 27.9* 28.6* 32.8*  MCV 94.3 94.9 94.2 94.9 94.4 97.6  PLT 230 267 302 288 297 312   Cardiac Enzymes: No results for input(s): "CKTOTAL", "CKMB", "CKMBINDEX", "TROPONINI" in the last 168 hours. BNP: Invalid input(s): "POCBNP" CBG: No results for input(s): "GLUCAP" in the last 168 hours. D-Dimer No results for input(s): "DDIMER" in the last 72 hours. Hgb A1c No results for input(s): "HGBA1C" in the last 72 hours. Lipid Profile No results for input(s): "CHOL", "HDL", "LDLCALC", "TRIG", "CHOLHDL", "LDLDIRECT" in the last 72 hours. Thyroid function studies No results for input(s): "TSH", "T4TOTAL", "T3FREE", "THYROIDAB" in the last 72 hours.  Invalid input(s): "FREET3" Anemia work up No results for input(s): "VITAMINB12", "FOLATE", "FERRITIN", "TIBC", "IRON", "RETICCTPCT" in the last 72 hours. Microbiology Recent Results (from the past 240 hour(s))  C Difficile Quick Screen (NO PCR Reflex)     Status: None   Collection Time: 09/05/21  7:58 AM   Specimen: STOOL  Result Value Ref Range Status   C Diff antigen NEGATIVE NEGATIVE Final   C Diff toxin NEGATIVE NEGATIVE Final   C Diff interpretation No C. difficile detected.  Final    Comment: Performed at [facility], [place] [address], [place], [place] [address]  Gastrointestinal Panel by PCR , Stool     Status: None   Collection Time: 09/05/21  7:58 AM   Specimen: Stool  Result Value Ref Range Status   Campylobacter species NOT DETECTED NOT DETECTED Final   Plesimonas shigelloides NOT DETECTED NOT DETECTED Final   Salmonella species NOT DETECTED NOT DETECTED Final   Yersinia enterocolitica NOT DETECTED NOT DETECTED Final   Vibrio species NOT DETECTED NOT DETECTED Final   Vibrio cholerae NOT DETECTED NOT DETECTED Final   Enteroaggregative E coli (EAEC) NOT DETECTED NOT DETECTED Final   Enteropathogenic E coli (EPEC)  NOT DETECTED NOT DETECTED Final   Enterotoxigenic E coli (ETEC) NOT DETECTED NOT DETECTED Final   Shiga like toxin producing E coli (STEC) NOT DETECTED NOT DETECTED Final   Shigella/Enteroinvasive E coli (EIEC) NOT DETECTED NOT DETECTED Final   Cryptosporidium NOT DETECTED NOT DETECTED Final   Cyclospora cayetanensis NOT DETECTED NOT DETECTED Final   Entamoeba histolytica NOT DETECTED NOT DETECTED Final   Giardia lamblia NOT DETECTED NOT DETECTED Final   Adenovirus F40/41 NOT DETECTED NOT DETECTED Final   Astrovirus NOT DETECTED NOT DETECTED Final   Norovirus GI/GII NOT DETECTED NOT DETECTED Final   Rotavirus A NOT DETECTED NOT DETECTED Final   Sapovirus (I, II, IV, and V) NOT DETECTED NOT DETECTED Final    Comment: Performed at [facility], [address] [place], [place] [address]    Time coordinating discharge: 35 minutes  Signed: Jakya Dovidio  [facility] 09/13/2021, 11:41 AM

## 2021-09-13 NOTE — Progress Notes (Signed)
Occupational Therapy Treatment Patient Details Name: Caitlin Chavez MRN: [id number] DOB: 1934-12-18 Today's Date: 09/13/2021   History of present illness 87 year old female history of chronic A-fib on Eliquis, hypertension, chronic UTIs presents the ER today with confusion and weakness. She has been getting injections for neck pain recently. Sons report significant decline in her function in the last month. patient was transitioned to stepdown unit on 8/6 with sepsis protocol initated.   OT comments  Patient up in chair when therapist entered room. She was requesting something to drink. She exhibits a tremor in bilateral upper extremities and mouth - but able to drink from a straw and hold a cup. Patient mod assist to stand from recliner and min assist with heavy cues to take steps to bed. Total assist back to bed. Patient complaining of neck pain but also reporting it's baseline. Patient able to talk to therapist and agreeable to try hard at rehab to see if she can get better. Continue to recommend short term rehab.   Recommendations for follow up therapy are one component of a multi-disciplinary discharge planning process, led by the attending physician.  Recommendations may be updated based on patient status, additional functional criteria and insurance authorization.    Follow Up Recommendations  Skilled nursing-short term rehab (<3 hours/day)    Assistance Recommended at Discharge Frequent or constant Supervision/Assistance  Patient can return home with the following  Two people to help with walking and/or transfers;Two people to help with bathing/dressing/bathroom;Direct supervision/assist for medications management;Help with stairs or ramp for entrance;Assist for transportation;Direct supervision/assist for financial management;Assistance with cooking/housework;Assistance with feeding   Equipment Recommendations  Other (comment) (defer)    Recommendations for Other Services       Precautions / Restrictions Precautions Precautions: Fall Restrictions Weight Bearing Restrictions: No       Mobility Bed Mobility Overal bed mobility: Needs Assistance Bed Mobility: Sit to Supine       Sit to supine: Total assist   General bed mobility comments: Max assist for LB and trunk to return to supine. patient holding her neck with bialteral hands to support it due to neck pain.    Transfers Overall transfer level: Needs assistance Equipment used: Rolling walker (2 wheels) Transfers: Sit to/from Stand Sit to Stand: Mod assist           General transfer comment: Mod assist to stand from recliner today with +2 assistance. Patient able to take steps to bed with step by step verbal instructions with intermittent tapping of leg as well.     Balance           Standing balance support: Bilateral upper extremity supported, During functional activity Standing balance-Leahy Scale: Poor                             ADL either performed or assessed with clinical judgement   ADL                                              Extremity/Trunk Assessment Upper Extremity Assessment Upper Extremity Assessment: Generalized weakness   Lower Extremity Assessment Lower Extremity Assessment: Generalized weakness   Cervical / Trunk Assessment Cervical / Trunk Assessment: Kyphotic    Vision   Vision Assessment?: No apparent visual deficits   Perception     Praxis  Cognition Arousal/Alertness: Awake/alert Behavior During Therapy: WFL for tasks assessed/performed Overall Cognitive Status: Within Functional Limits for tasks assessed                                          Exercises      Shoulder Instructions       General Comments      Pertinent Vitals/ Pain       Pain Assessment Pain Assessment: Faces Faces Pain Scale: Hurts even more Pain Location: neck, back and perineal area Pain Descriptors /  Indicators: Discomfort, Grimacing Pain Intervention(s): Monitored during session, Limited activity within patient's tolerance, Repositioned  Home Living                                          Prior Functioning/Environment              Frequency  Min 2X/week        Progress Toward Goals  OT Goals(current goals can now be found in the care plan section)  Progress towards OT goals: Progressing toward goals  Acute Rehab OT Goals Patient Stated Goal: to get better OT Goal Formulation: With patient/family Time For Goal Achievement: 09/17/21 Potential to Achieve Goals: [place] Discharge plan remains appropriate    Co-evaluation                 AM-PAC OT "6 Clicks" Daily Activity     Outcome Measure   Help from another person eating meals?: A Little Help from another person taking care of personal grooming?: A Lot Help from another person toileting, which includes using toliet, bedpan, or urinal?: Total Help from another person bathing (including washing, rinsing, drying)?: Total Help from another person to put on and taking off regular upper body clothing?: A Lot Help from another person to put on and taking off regular lower body clothing?: Total 6 Click Score: 10    End of Session Equipment Utilized During Treatment: Rolling walker (2 wheels)  OT Visit Diagnosis: Unsteadiness on feet (R26.81);Other abnormalities of gait and mobility (R26.89);Muscle weakness (generalized) (M62.81);Feeding difficulties (R63.3)   Activity Tolerance Patient limited by pain   Patient Left in bed;with call bell/phone within reach;with nursing/sitter in room   Nurse Communication Mobility status (patient needs cleaning)        Time: 3154-0086 OT Time Calculation (min): 13 min  Charges: OT General Charges $OT Visit: 1 Visit OT Treatments $Therapeutic Activity: 8-22 mins  Derl Barrow, OTR/L [place]  Office [phone] Pager:  [place] 09/13/2021, 3:32 PM

## 2021-09-13 NOTE — Progress Notes (Signed)
Physical Therapy Treatment Patient Details Name: Caitlin Chavez MRN: [id number] DOB: 1934/05/09 Today's Date: 09/13/2021   History of Present Illness 87 year old female history of chronic A-fib on Eliquis, hypertension, chronic UTIs presents the ER today with confusion and weakness. She has been getting injections for neck pain recently. Sons report significant decline in her function in the last month. patient was transitioned to stepdown unit on 8/6 with sepsis protocol initated.    PT Comments    Son present during session and assisted pt OOB to take a few steps. General bed mobility comments: required HOB elevated, use of rail as well as bed pad to complete scooting to EOB.  Very Weak.  Poor forward flex posture with c/o neck/back pain. General transfer comment: + 2 Max Assist side by side with 50% VC's on proper hand placement to self assist with push off from elevated bed. General Gait Details: + 2 side by side assist a limited distance of 5 feet with recliner following closely behind. Pt will need ST Rehab at SNF to address mobility and functional decline prior to safely returning home.   Recommendations for follow up therapy are one component of a multi-disciplinary discharge planning process, led by the attending physician.  Recommendations may be updated based on patient status, additional functional criteria and insurance authorization.  Follow Up Recommendations  Skilled nursing-short term rehab (<3 hours/day)     Assistance Recommended at Discharge    Patient can return home with the following Two people to help with walking and/or transfers   Equipment Recommendations  None recommended by PT    Recommendations for Other Services       Precautions / Restrictions Precautions Precautions: Fall Restrictions Weight Bearing Restrictions: No     Mobility  Bed Mobility Overal bed mobility: Needs Assistance Bed Mobility: Supine to Sit     Supine to sit: Max assist,  Total assist, +2 for physical assistance, +2 for safety/equipment     General bed mobility comments: required HOB elevated, use of rail as well as bed pad to complete scooting to EOB.  Very Weak.  Poor forward flex posture with c/o neck/back pain.    Transfers Overall transfer level: Needs assistance Equipment used: Rolling walker (2 wheels) Transfers: Sit to/from Stand Sit to Stand: Max assist, +2 physical assistance, +2 safety/equipment, From elevated surface           General transfer comment: + 2 Max Assist side by side with 50% VC's on proper hand placement to self assist with push off from elevated bed.    Ambulation/Gait Ambulation/Gait assistance: +2 physical assistance, +2 safety/equipment, Max assist Gait Distance (Feet): 5 Feet Assistive device: Rolling walker (2 wheels) Gait Pattern/deviations: Step-to pattern, Trunk flexed, Shuffle Gait velocity: decreased     General Gait Details: + 2 side by side assist a limited distance of 5 feet with recliner following closely behind.   Stairs             Wheelchair Mobility    Modified Rankin (Stroke Patients Only)       Balance                                            Cognition Arousal/Alertness: Awake/alert Behavior During Therapy: WFL for tasks assessed/performed Overall Cognitive Status: Within Functional Limits for tasks assessed  General Comments: AxO x 3 feeling "weak" and c/o ALOT neck pain.  RN aware/heat pad given        Exercises      General Comments        Pertinent Vitals/Pain Pain Assessment Pain Assessment: Faces Faces Pain Scale: Hurts a little bit Pain Location: neck, back and perineal area Pain Descriptors / Indicators: Discomfort, Grimacing Pain Intervention(s): Monitored during session, Repositioned    Home Living                          Prior Function            PT Goals (current goals  can now be found in the care plan section) Progress towards PT goals: Progressing toward goals    Frequency    Min 2X/week      PT Plan Current plan remains appropriate    Co-evaluation              AM-PAC PT "6 Clicks" Mobility   Outcome Measure  Help needed turning from your back to your side while in a flat bed without using bedrails?: A Lot Help needed moving from lying on your back to sitting on the side of a flat bed without using bedrails?: A Lot Help needed moving to and from a bed to a chair (including a wheelchair)?: A Lot Help needed standing up from a chair using your arms (e.g., wheelchair or bedside chair)?: A Lot Help needed to walk in hospital room?: A Lot Help needed climbing 3-5 steps with a railing? : A Lot 6 Click Score: 12    End of Session Equipment Utilized During Treatment: Gait belt Activity Tolerance: Patient limited by fatigue Patient left: in chair;with chair alarm set;with call bell/phone within reach;with family/visitor present Nurse Communication: Mobility status PT Visit Diagnosis: Muscle weakness (generalized) (M62.81);Pain;Difficulty in walking, not elsewhere classified (R26.2)     Time: 1029-1100 PT Time Calculation (min) (ACUTE ONLY): 31 min  Charges:  $Gait Training: 8-22 mins $Therapeutic Activity: 8-22 mins                     Rica Koyanagi  PTA Acute  Rehabilitation Services Office M-F          [phone] Weekend pager [phone]

## 2021-09-13 NOTE — TOC Transition Note (Signed)
Transition of Care [facility]) - CM/SW Discharge Note   Patient Details  Name: Caitlin Chavez MRN: [id number] Date of Birth: 1934/09/08  [facility] CM/SW Contact:  Leeroy Cha, RN Phone Number: 09/13/2021, 11:56 AM   Clinical Narrative:    Patient discharged to [facility] room 212 via ptar Ptar called at 1200 Number to call report is [phone]   Final next level of care: Skilled Nursing Facility Barriers to Discharge: Barriers Resolved   Patient Goals and CMS Choice Patient states their goals for this hospitalization and ongoing recovery are:: Rehab CMS Medicare.gov Compare Post Acute Care list provided to:: Patient Represenative (must comment) (Rodney(son)) Choice offered to / list presented to : NA  Discharge Placement                       Discharge Plan and Services   Discharge Planning Services: CM Consult Post Acute Care Choice: Skilled Nursing Facility                               Social Determinants of Health (SDOH) Interventions     Readmission Risk Interventions     No data to display

## 2021-09-13 NOTE — Plan of Care (Signed)
  Problem: Education: Goal: Knowledge of General Education information will improve Description: Including pain rating scale, medication(s)/side effects and non-pharmacologic comfort measures Outcome: Progressing   Problem: Clinical Measurements: Goal: Will remain free from infection Outcome: Progressing Goal: Cardiovascular complication will be avoided Outcome: Progressing   

## 2021-09-13 NOTE — Plan of Care (Signed)
  Problem: Education: Goal: Knowledge of General Education information will improve Description: Including pain rating scale, medication(s)/side effects and non-pharmacologic comfort measures Outcome: Progressing   Problem: Clinical Measurements: Goal: Ability to maintain clinical measurements within normal limits will improve Outcome: Progressing   Problem: Clinical Measurements: Goal: Diagnostic test results will improve Outcome: Progressing Goal: Signs and symptoms of infection will decrease Outcome: Progressing

## 2021-09-13 NOTE — Progress Notes (Addendum)
Patient ID: Caitlin Chavez, female   DOB: 1934-03-19, 87 y.o.   MRN: [id number]    Progress Note   Subjective   Day # 13  CC; sigmoid diverticulitis-persistent abdominal pain/diarrhea  IV Zosyn -day 10  Potassium 4.6/creatinine 0.82 WBC 10.4/hemoglobin 10.4 stable  Abdominal films 09/12/2021-mild small and large bowel distention suggesting ileus  Sons both in room this morning, patient had been sitting on the side of the bed, trying to attempt to stand with walker with nurse assistance Patient is more interactive today, able to make some jokes, a bit tearful when talking about need to push herself-and expresses that she is not sure how much more pain she can take. She is still having a lot of neck pain Since reports she has not been eating much-only had 1 protein supplement yesterday Loose bowel movement last evening per nursing x1  Patient says abdomen still feels somewhat bloated, full, and still hurts with palpation   Objective   Vital signs in last 24 hours: Temp:  [97.5 F (36.4 C)-98.8 F (37.1 C)] 98.2 F (36.8 C) (08/16 0438) Pulse Rate:  [89-110] 101 (08/16 0438) Resp:  [16-20] 18 (08/16 0438) BP: (130-154)/(69-80) 143/79 (08/16 0438) SpO2:  [97 %-99 %] 99 % (08/16 0438) Last BM Date : 09/12/21 General: Elderly white female in NAD-weak and frail appearing Heart:  Regular rate and rhythm; no murmurs Lungs: Respirations even and unlabored, lungs CTA bilaterally Abdomen:  Soft, some distention, no high-pitched bowel sounds presently, remains tender across the lower abdomen , no rebound Extremities:  Without edema. Neurologic:  Alert and oriented,  grossly normal neurologically. Psych:  Cooperative. Normal mood and affect.  Intake/Output from previous day: 08/15 0701 - 08/16 0700 In: 113.8 [IV Piggyback:113.8] Out: 750 [Urine:750] Intake/Output this shift: No intake/output data recorded.  Lab Results: Recent Labs    09/11/21 0432 09/12/21 0619 09/13/21 0603   WBC 8.4 12.4* 10.4  HGB 9.1* 9.4* 10.4*  HCT 27.9* 28.6* 32.8*  PLT 288 297 312   BMET Recent Labs    09/11/21 0432 09/12/21 0619 09/13/21 0603  NA 133* 133* 136  K 3.8 2.7* 4.6  CL 98 95* 98  CO2 '27 26 29  '$ GLUCOSE 91 111* 88  BUN '12 12 16  '$ CREATININE 0.63 0.76 0.82  CALCIUM 7.9* 7.9* 8.3*   LFT No results for input(s): "PROT", "ALBUMIN", "AST", "ALT", "ALKPHOS", "BILITOT", "BILIDIR", "IBILI" in the last 72 hours. PT/INR No results for input(s): "LABPROT", "INR" in the last 72 hours.  Studies/Results: DG Abd Portable 2V  Result Date: 09/12/2021 CLINICAL DATA:  Rule out bowel obstruction.  Assess stool burden EXAM: PORTABLE ABDOMEN - 2 VIEW COMPARISON:  09/08/2021 FINDINGS: Gas throughout the large and small bowel loops. No bowel obstruction. Probable hiatal hernia. Small stool burden.  Surgical clips right upper quadrant. IMPRESSION: Mild small and large bowel distension suggesting ileus. Electronically Signed   By: Franchot Gallo M.D.   On: 09/12/2021 12:19   VAS [place] ABI WITH/WO TBI  Result Date: 09/10/2021  LOWER EXTREMITY DOPPLER STUDY Patient Name:  Caitlin Chavez  Date of Exam:   09/09/2021 Medical Rec #: [id number]        Accession #:    [id number] Date of Birth: 07/29/1934        Patient Gender: F Patient Age:   74 years Exam Location:  [facility] Procedure:      VAS [place] ABI WITH/WO TBI Referring Phys: Raiford Noble --------------------------------------------------------------------------------  Indications: Claudication. High Risk  Factors: Hypertension.  Comparison Study: No previous exam noted. Performing Technologist: Bobetta Lime BS, RVT  Examination Guidelines: A complete evaluation includes at minimum, Doppler waveform signals and systolic blood pressure reading at the level of bilateral brachial, anterior tibial, and posterior tibial arteries, when vessel segments are accessible. Bilateral testing is considered an integral part of a complete examination.  Photoelectric Plethysmograph (PPG) waveforms and toe systolic pressure readings are included as required and additional duplex testing as needed. Limited examinations for reoccurring indications may be performed as noted.  ABI Findings: +---------+------------------+-----+---------+--------+ Right    Rt Pressure (mmHg)IndexWaveform Comment  +---------+------------------+-----+---------+--------+ Brachial 162                    triphasic         +---------+------------------+-----+---------+--------+ PTA      163               1.01 triphasic         +---------+------------------+-----+---------+--------+ DP       170               1.05 triphasic         +---------+------------------+-----+---------+--------+ Great Toe115               0.71                   +---------+------------------+-----+---------+--------+ +---------+------------------+-----+---------+-------+ Left     Lt Pressure (mmHg)IndexWaveform Comment +---------+------------------+-----+---------+-------+ Brachial 159                    triphasic        +---------+------------------+-----+---------+-------+ PTA      146               0.90 triphasic        +---------+------------------+-----+---------+-------+ DP       169               1.04 triphasic        +---------+------------------+-----+---------+-------+ Great Toe98                0.60                  +---------+------------------+-----+---------+-------+ +-------+-----------+-----------+------------+------------+ ABI/TBIToday's ABIToday's TBIPrevious ABIPrevious TBI +-------+-----------+-----------+------------+------------+ Right  1.05       0.71                                +-------+-----------+-----------+------------+------------+ Left   1.04       0.60                                +-------+-----------+-----------+------------+------------+  Summary: Right: Resting right ankle-brachial index is within normal  range. The right toe-brachial index is normal. Left: Resting left ankle-brachial index is within normal range. The left toe-brachial index is abnormal. *See table(s) above for measurements and observations.  Electronically signed by Orlie Pollen on 09/10/2021 at 10:42:40 AM.    Final        Assessment / Plan:    #42 87 year old white female admitted 12 days ago with abdominal pain and diarrhea. She has had serial CTs and these have been consistent with an uncomplicated sigmoid diverticulitis, no evidence for abscess or other complication- Last CT 4 days ago did show some persistence of the diverticulitis however.  Continues on IV Zosyn day 11 Have been continuing IV Zosyn, while she remains in  the hospital-however okay to switch to Augmentin 875 twice daily to complete at least a 14-day course-could consider giving her a week of oral Augmentin  Diarrhea much improved, stopped Imodium  #2 persistent mild ileus-discussed need to decrease pain medication with the patient-standing that she is having a lot of back and neck pain Would like to decrease dose-but allow her to have low-dose Vicodin for comfort  #3  2.3 cm cystic lesion of the pancreas noted on imaging/pancreatic head-new since 2020 Normal CA 19-9 Had not plan to pursue MRI/further work-up at this time, this can be followed up as an outpatient  #4 metabolic encephalopathy-resolved #5 history of atrial fibrillation-on Eliquis #6 anasarca-on diuretics #7 failure to thrive-multifactorial--she does seem to be making some progress, seems brighter today, understands that she needs to try to gain some mobility Plan is for transfer to nursing facility/rehab short-term- Hopefully can continue to improve, gain some strength over the next few days prior to transfer.  GI will be available as needed   Principal Problem:   Acute metabolic encephalopathy Active Problems:   Generalized weakness   Essential hypertension   Chronic a-fib  (HCC)   Sigmoid diverticulitis   Chronic anticoagulation - on eliquis for afib   Cervical pain (neck)   Hypokalemia   GI bleed   Pancreatic mass   Abnormal CT of the abdomen     LOS: 12 days   Amy Esterwood PA-C 09/13/2021, 8:56 AM  GI ATTENDING  Interval history data reviewed.  Patient seen and examined.  Agree with interval progress note as outlined above.  Agree with assessment and recommendations.  Ongoing supportive care as outlined above.  GI will sign off, but available if needed.  Docia Chuck. Geri Seminole., M.D. [facility] Division of Gastroenterology

## 2021-09-22 ENCOUNTER — Encounter: Payer: Self-pay | Admitting: Family Medicine

## 2021-09-22 ENCOUNTER — Non-Acute Institutional Stay: Payer: Medicare Other | Admitting: Family Medicine

## 2021-09-22 VITALS — BP 98/70 | HR 68

## 2021-09-22 DIAGNOSIS — I272 Pulmonary hypertension, unspecified: Secondary | ICD-10-CM

## 2021-09-22 DIAGNOSIS — R601 Generalized edema: Secondary | ICD-10-CM

## 2021-09-22 DIAGNOSIS — F0393 Unspecified dementia, unspecified severity, with mood disturbance: Secondary | ICD-10-CM

## 2021-09-22 DIAGNOSIS — Z515 Encounter for palliative care: Secondary | ICD-10-CM

## 2021-09-22 DIAGNOSIS — K8689 Other specified diseases of pancreas: Secondary | ICD-10-CM

## 2021-09-22 DIAGNOSIS — K449 Diaphragmatic hernia without obstruction or gangrene: Secondary | ICD-10-CM

## 2021-09-22 DIAGNOSIS — E8809 Other disorders of plasma-protein metabolism, not elsewhere classified: Secondary | ICD-10-CM

## 2021-09-22 DIAGNOSIS — E46 Unspecified protein-calorie malnutrition: Secondary | ICD-10-CM

## 2021-09-22 NOTE — Progress Notes (Unsigned)
[facility] Consult Note Telephone: [phone]  Fax: [phone]   Date of encounter: 09/22/21 11:03 AM PATIENT NAME: Caitlin Chavez [id number]   [phone] (home)  DOB: Sep 26, 1934 MRN: [id number] PRIMARY CARE PROVIDER:    Lorn Junes  [place] [id number] [phone]  REFERRING PROVIDER:   Sueanne Margarita, [address] [place],  [place] [address] [phone]  RESPONSIBLE PARTY:    Contact Information     Name Relation Home Work [place] Son [phone]     aneyah, lortz   [phone]        I met face to face with patient in her assisted living facility. Palliative Care was asked to follow this patient by consultation request of  Sueanne Margarita, DO to address advance care planning and complex medical decision making. This is the initial visit.          ASSESSMENT, SYMPTOM MANAGEMENT AND PLAN / RECOMMENDATIONS:   Dementia with mood disturbance Appears depressed with persistent vegetative symptoms of anorexia, lack of motivation, lack of eye contact and apathy. Agree Mirtazapine may help with depression and stimulate appetite, continue 7.5 mg QHS Not on any Dementia meds. Pt would benefit from psychiatric assessment/counseling and possibly from addition of Cymbalta for depression and pain management.   Hypoalbuminemia due to severe protein calorie           Malnutrition/anasarca/large hiatal hernia Small, frequent meals high in protein would be beneficial to improve anasarca/hypoalbuminemia. Large sliding hiatal hernia increases risk for aspiration.  Upright for all intake and for 60 minutes after. Add high protein supplement TID.    3.    Pancreatic Mass/Palliative Care Encounter Asymptomatic for pancreatitis, CA 19-9 normal which is reassuring. Need to discuss goals of care with pt and sons to see if surveillance or advanced work up  needed.  4.    Moderate pulmonary hypertension Encourage compliance with Metoprolol 12.5 mg BID for rate control of afib, decreasing risk of pulmonary edema. Continue HCTZ 12.5 mg daily. Follow up with Cardiology for normal surveillance. Not on any specific treatment for pulmonary hypertension.  Follow up Palliative Care Visit: Palliative care will continue to follow for complex medical decision making, advance care planning, and clarification of goals. Return 4 weeks or prn.    This visit was coded based on medical decision making (MDM).  PPS: 50%  HOSPICE ELIGIBILITY/DIAGNOSIS: TBD  Chief Complaint:  Vincent received a referral to follow up with patient for chronic disease management in setting of dementia with severe protein calorie malnutrition/failure to thrive.  Palliative Care is also following to assist with advance directive planning and defining/refining goals of care.   HISTORY OF PRESENT ILLNESS:  Caitlin Chavez is a 87 y.o. year old female with dementia with mood disturbance, chronic afib on anticoagulation, HTN, sliding hiatal hernia, GERD, essential tremor, peripheral neuropathy, falls, hypoalbuminemia, anemia, cervical disc pain, hx of gout and generalized debility.  She was recently hospitalized on 08/31/21 with sepsis due to colitis and E coli UTI and received IV Zosyn.  She came in with cervical neck pain and was receiving steroid treatment and scheduled Norco with muscle relaxers for neck pain.  She was seen by PT and OT during hospitalization and noted to be weak with poor posture and poor endurance. Currently pt is able to ambulate to BR with assistance.  States poor appetite and only has pain with pressure  on abdomen.  Reports continent of bowel and bladder.  Denies falls, CP or SOB. No trouble with coughing or choking after eating or drinking. Denies dysuria, constipation although she did receive Imodium inpatient with subsequent development  of ileus.  She was started on Flomax when foley removed and was successful with post cath voiding trial.  PCP records from [facility] indicated that pt has dementia and was c/o neck pain.  Reports apathy, denies being down, depressed or hopeless. Pt states she has 2 sons who are HC POA. During hospitalization with CT abd/pelvis there was an incidental finding of a 2.1 cm pancreatic head cystic lesion with ductal dilatation with recommendation to follow up with MRI, had normal CA 19-9 of 20 on 09/10/21 which is reassuring.  Pt denies bright red or dark, tarry stools, nausea or vomiting but does have a hx of GI bleed. Denies any upper abdominal pain. She does not remember when she first began having no appetite but it has continued.  Discussed that albumin in blood is building block for energy, maintaining intravascular volume and an important factor in maintaining immune system.  Advised that we have to get her to start eating more protein or drinking protein supplements "or as my sons say I might as well just give up living." Staff advises that pt rarely comes out of her room.  History obtained from review of EMR, interview with facility staff and Ms. Pagnotta.      Latest Ref Rng & Units 09/13/2021    6:03 AM 09/12/2021    6:19 AM 09/11/2021    4:32 AM  CBC  WBC 4.0 - 10.5 K/uL 10.4  12.4  8.4   Hemoglobin 12.0 - 15.0 g/dL 10.4  9.4  9.1   Hematocrit 36.0 - 46.0 % 32.8  28.6  27.9   Platelets 150 - 400 K/uL 312  297  288     Component Ref Range & Units 13 d ago (09/10/21) 2 wk ago (09/09/21) 2 wk ago (09/08/21) 2 wk ago (09/07/21) 2 wk ago (09/06/21) 2 wk ago (09/05/21) 2 wk ago (09/04/21)  Sodium 135 - 145 mmol/L 133 Low   134 Low   133 Low   130 Low   129 Low   128 Low   127 Low    Potassium 3.5 - 5.1 mmol/L 4.1  4.2  4.2  3.7  3.9 CM  3.1 Low   3.6   Chloride 98 - 111 mmol/L 100  102  102  100  98  95 Low   96 Low    CO2 22 - 32 mmol/L _0 Low   _1 Glucose, Bld 70 - 99 mg/dL 91  89 CM   92 CM  86 CM  93 CM  99 CM  113 High  CM   Comment: Glucose reference range applies only to samples taken after fasting for at least 8 hours.  BUN 8 - 23 mg/dL _2 Creatinine, Ser 0.44 - 1.00 mg/dL 0.66  0.77  0.74  0.78  0.83  0.73  0.96   Calcium 8.9 - 10.3 mg/dL 7.9 Low   8.0 Low   7.8 Low   7.7 Low   7.6 Low   7.8 Low   7.9 Low    Total Protein 6.5 - 8.1 g/dL 5.1 Low   5.1 Low  Albumin 3.5 - 5.0 g/dL 1.8 Low   1.8 Low   2.0 Low   1.8 Low   2.1 Low   2.0 Low     AST 15 - 41 U/L 20  12 Low         ALT 0 - 44 U/L 20  14        Alkaline Phosphatase 38 - 126 U/L 54  50        Total Bilirubin 0.3 - 1.2 mg/dL 0.6  0.7        GFR, Estimated >60 mL/min >60  >60 CM  >60 CM  >60 CM  >60 CM  >60 CM  58 Low      08/31/21 CT Cervical Spine: FINDINGS: Alignment: There is mild anterolisthesis at C5-C6.   Skull base and vertebrae: No acute fracture. No primary bone lesion or focal pathologic process.   Soft tissues and spinal canal: No prevertebral fluid or swelling. No visible canal hematoma.   Disc levels: Multilevel intervertebral disc space narrowing, uncovertebral osteophyte formation and facet arthropathy is noted bilaterally resulting in mild spinal canal stenosis. There is mild to moderate neural foraminal narrowing at C3-C4 and C4-C5.   Upper chest: Negative.   Other: None.   IMPRESSION: 1. No acute fracture. 2. Multilevel degenerative changes as detailed above.   2d echo 09/04/21: Left ventricular EF 70 %. Normal wall motion, mild left ventricular hypertrophy. Grade I diastolic dysfunction (impaired relaxation).  Right ventricular normal size and systolic function. Moderately elevated right ventricular systolic pressure is 93.2 mmHg.   CT abd/pelvis with contrast 09/09/21: IMPRESSION: Mild sigmoid diverticulitis, without significant change. No evidence of abscess or other complication.   Stable large hiatal hernia.   Stable small paraumbilical  ventral hernia containing only fat.   2.1 cm cystic lesion in pancreatic head. Recommend abdomen MRI without and with contrast for further characterization.    Aortic Atherosclerosis (ICD10-I70.0).  09/10/21 CT angio AO and bifem with CM: IMPRESSION: 1. No aortic aneurysm or dissection. 2. No significant stenosis of the lower extremity arterial vasculature. 3. Extensive subcutaneous edema over the lower abdomen and bilateral lower extremities consistent with anasarca. 4. Stable large hiatal hernia. 5. Stable appearance of ventral hernias. 6. Stable appearance of right-sided pleural effusions and bibasilar atelectasis. 7. Cystic lesion in the pancreatic head is less well seen in arterial phase imaging. 8. Aortic Atherosclerosis (ICD10-I70.0).    I reviewed EMR for available labs, medications, imaging, studies and related documents.  Records reviewed and summarized above.   ROS General: NAD ENMT: denies dysphagia Cardiovascular: denies chest pain, denies DOE Pulmonary: denies cough, denies increased SOB Abdomen: endorses poor appetite, denies constipation, endorses continence of bowel GU: denies dysuria, endorses continence of urine MSK:  endorses increased weakness with need for assisted mobility, no falls reported Skin: denies rashes or wounds Neurological: endorses pain abd LLQ with palpation, denies insomnia Psych: Endorses apathethic mood Heme/lymph/immuno: denies blood in stool    Physical Exam: Current and past weights: 177 kbs 0.5 ounces as of 09/06/21, 150 lbs as of 08/24/21 Constitutional: NAD General: frail appearing ENMT: intact hearing CV: S1S2, IRIR, trace pedal edema Pulmonary: CTAB, no increased work of breathing, no cough, room air Abdomen: hypo-active BS + 4 quadrants, soft non-tender in epigastric and upper abdomen GU: deferred MSK: no sarcopenia, moves all extremities Skin: warm and dry, no rashes or wounds on visible skin Neuro:  stares blankly without  making eye contact, endorses memory issues and thought trails off intermittently  mid sentence.  Resting hand tremors Psych: non-anxious affect, A and O x 2 Hem/lymph/immuno: no widespread bruising  CURRENT PROBLEM LIST:  Patient Active Problem List   Diagnosis Date Noted   Neck pain    Abnormal CT of the abdomen    Pancreatic mass 09/08/2021   GI bleed 78/46/9629   Acute metabolic encephalopathy 52/84/1324   Sigmoid diverticulitis 08/31/2021   Chronic anticoagulation - on eliquis for afib 08/31/2021   Cervical pain (neck) 08/31/2021   Hypokalemia 08/31/2021   Allergy to alpha-gal 01/14/2020   Hypomagnesemia 12/10/2018   Chronic a-fib (HCC) 12/09/2018   GERD (gastroesophageal reflux disease)    Peripheral neuropathy    Anemia    History of gout 11/01/2018   Acute bilateral ankle pain 10/31/2018   Failure to thrive in adult 10/29/2018   Ankle fracture, right 10/29/2018   Falls 10/28/2018   Hiatal hernia 10/28/2018   Hyponatremia 10/28/2018   Generalized weakness 10/28/2018   Essential hypertension 10/28/2018   Hypoalbuminemia 10/28/2018   Anxiety 10/28/2018   CKD (chronic kidney disease) stage 3, GFR 30-59 ml/min (HCC) 09/04/2018   Essential tremor 07/09/2017   PAST MEDICAL HISTORY:  Active Ambulatory Problems    Diagnosis Date Noted   Essential tremor 07/09/2017   Falls 10/28/2018   Hiatal hernia 10/28/2018   Hyponatremia 10/28/2018   Generalized weakness 10/28/2018   Essential hypertension 10/28/2018   Hypoalbuminemia 10/28/2018   Anxiety 10/28/2018   Failure to thrive in adult 10/29/2018   Ankle fracture, right 10/29/2018   Acute bilateral ankle pain 10/31/2018   History of gout 11/01/2018   GERD (gastroesophageal reflux disease)    Peripheral neuropathy    Anemia    Chronic a-fib ([facility]) 12/09/2018   Hypomagnesemia 12/10/2018   Allergy to alpha-gal 40/10/2723   Acute metabolic encephalopathy 36/64/4034   Sigmoid diverticulitis 08/31/2021   CKD (chronic  kidney disease) stage 3, GFR 30-59 ml/min (HCC) 09/04/2018   Chronic anticoagulation - on eliquis for afib 08/31/2021   Cervical pain (neck) 08/31/2021   Hypokalemia 08/31/2021   GI bleed 09/01/2021   Pancreatic mass 09/08/2021   Abnormal CT of the abdomen    Neck pain    Resolved Ambulatory Problems    Diagnosis Date Noted   FUO (fever of unknown origin) 10/31/2018   Sepsis secondary to UTI ([facility]) 12/09/2018   Past Medical History:  Diagnosis Date   Arthritis    Carpal tunnel syndrome    Colon polyps    Diverticulosis    Hypertension    Macular degeneration    SOCIAL HX:  Social History   Tobacco Use   Smoking status: Never   Smokeless tobacco: Never  Substance Use Topics   Alcohol use: No   FAMILY HX:  Family History  Problem Relation Age of Onset   Heart attack Father 39   Alcohol abuse Father    Heart failure Mother 74       chf   Heart disease Mother    Stomach cancer Maternal Grandmother    Prostate cancer Son    Colon cancer Neg Hx        Preferred Pharmacy: ALLERGIES:  Allergies  Allergen Reactions   Alka-Seltzer [Aspirin Effervescent] Anaphylaxis and Hives   Alpha-Gal Anaphylaxis    NO MEAT, meat derived products   Beef-Derived Products Anaphylaxis   Nsaids Anaphylaxis and Hives   Other Anaphylaxis and Rash    Alpha-gal allergy, NO MEAT   Pork-Derived Products Anaphylaxis   Codeine Hives and Nausea And Vomiting  PERTINENT MEDICATIONS:  Outpatient Encounter Medications as of 09/22/2021  Medication Sig   acetaminophen (TYLENOL) 325 MG tablet Take 2 tablets (650 mg total) by mouth every 6 (six) hours as needed for mild pain (or Fever >/= 101).   allopurinol (ZYLOPRIM) 100 MG tablet Take 100 mg by mouth 2 (two) times daily.   apixaban (ELIQUIS) 5 MG TABS tablet Take 1 tablet (5 mg total) by mouth 2 (two) times daily.   cholecalciferol (VITAMIN D) 25 MCG (1000 UT) tablet Take 2,000 Units by mouth daily.   EPINEPHrine 0.3 mg/0.3 mL IJ SOAJ  injection Inject 0.3 mg into the muscle as needed.   feeding supplement (ENSURE ENLIVE / ENSURE PLUS) LIQD Take 237 mLs by mouth 2 (two) times daily between meals.   gabapentin (NEURONTIN) 100 MG capsule Take 1 capsule (100 mg total) by mouth 2 (two) times daily.   guaiFENesin (ROBITUSSIN) 100 MG/5ML liquid Take 5 mLs by mouth every 4 (four) hours as needed for cough or to loosen phlegm.   hydrochlorothiazide (MICROZIDE) 12.5 MG capsule Take 12.5 mg by mouth daily.   ipratropium-albuterol (DUONEB) 0.5-2.5 (3) MG/3ML SOLN Take 3 mLs by nebulization every 4 (four) hours as needed.   lidocaine (LIDODERM) 5 % Place 1 patch onto the skin daily as needed (For neck pain).   melatonin 3 MG TABS tablet Take 1 tablet (3 mg total) by mouth at bedtime.   Menthol, Topical Analgesic, (BIOFREEZE) 4 % GEL Apply 1 application  topically daily as needed (For knee pain).   metoprolol tartrate (LOPRESSOR) 25 MG tablet Take 0.5 tablets (12.5 mg total) by mouth 2 (two) times daily.   mirtazapine (REMERON) 7.5 MG tablet Take 1 tablet (7.5 mg total) by mouth at bedtime for 3 days.   Multiple Vitamins-Minerals (OCUVITE PO) Take 1 tablet by mouth daily.    omeprazole (PRILOSEC) 40 MG capsule Take 40 mg by mouth daily.   Potassium 99 MG TABS Take 1 tablet by mouth 2 (two) times daily.   Probiotic Product (PROBIOTIC PO) Take 1 tablet by mouth daily.   senna-docusate (SENOKOT-S) 8.6-50 MG tablet Take 1 tablet by mouth at bedtime as needed for moderate constipation.   tamsulosin (FLOMAX) 0.4 MG CAPS capsule Take 1 capsule (0.4 mg total) by mouth daily.   No facility-administered encounter medications on file as of 09/22/2021.        Thank you for the opportunity to participate in the care of Ms. Rogus.  The palliative care team will continue to follow. Please call our office at [phone] if we can be of additional assistance.   Marijo Conception, FNP-C  COVID-19 PATIENT SCREENING TOOL Asked and negative response  unless otherwise noted:  Have you had symptoms of covid, tested positive or been in contact with someone with symptoms/positive test in the past 5-10 days? No

## 2021-09-23 ENCOUNTER — Encounter: Payer: Self-pay | Admitting: Family Medicine

## 2021-09-23 DIAGNOSIS — R601 Generalized edema: Secondary | ICD-10-CM | POA: Insufficient documentation

## 2021-09-23 DIAGNOSIS — I272 Pulmonary hypertension, unspecified: Secondary | ICD-10-CM | POA: Insufficient documentation

## 2021-09-23 DIAGNOSIS — F0393 Unspecified dementia, unspecified severity, with mood disturbance: Secondary | ICD-10-CM | POA: Insufficient documentation

## 2021-09-23 DIAGNOSIS — Z515 Encounter for palliative care: Secondary | ICD-10-CM | POA: Insufficient documentation

## 2021-10-29 DEATH — deceased
